# Patient Record
Sex: Male | Born: 1955 | Hispanic: Yes | Marital: Married | Smoking: Current every day smoker
Health system: Southern US, Community
[De-identification: ages and names within clinical notes are randomized; demographics above are authoritative.]

## PROBLEM LIST (undated history)

## (undated) DIAGNOSIS — I219 Acute myocardial infarction, unspecified: Secondary | ICD-10-CM

## (undated) DIAGNOSIS — B1920 Unspecified viral hepatitis C without hepatic coma: Secondary | ICD-10-CM

## (undated) DIAGNOSIS — F1910 Other psychoactive substance abuse, uncomplicated: Secondary | ICD-10-CM

## (undated) DIAGNOSIS — I10 Essential (primary) hypertension: Secondary | ICD-10-CM

## (undated) DIAGNOSIS — D509 Iron deficiency anemia, unspecified: Secondary | ICD-10-CM

## (undated) DIAGNOSIS — E785 Hyperlipidemia, unspecified: Secondary | ICD-10-CM

## (undated) HISTORY — DX: Acute myocardial infarction, unspecified: I21.9

## (undated) HISTORY — DX: Iron deficiency anemia, unspecified: D50.9

## (undated) HISTORY — PX: CARDIAC VALVE SURGERY: SHX40

## (undated) HISTORY — DX: Other psychoactive substance abuse, uncomplicated: F19.10

## (undated) HISTORY — DX: Essential (primary) hypertension: I10

## (undated) HISTORY — DX: Hyperlipidemia, unspecified: E78.5

## (undated) HISTORY — DX: Unspecified viral hepatitis C without hepatic coma: B19.20

---
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
## 2018-05-04 ENCOUNTER — Encounter: Payer: Self-pay | Admitting: Nurse Practitioner

## 2018-05-30 ENCOUNTER — Telehealth: Payer: Self-pay | Admitting: Family Medicine

## 2018-05-31 NOTE — Telephone Encounter (Signed)
Did you decide anything about this pt? I put the paperwork on your desk yesterday?

## 2018-05-31 NOTE — Telephone Encounter (Signed)
Should be okay to schedule. Not sure about paperwork. Thought I put it in my out box but not there now. WS

## 2018-05-31 NOTE — Telephone Encounter (Signed)
Dr Darlyn ReadStacks says he sent it up front and ok to schedule.

## 2018-06-01 NOTE — Telephone Encounter (Signed)
Called patient to schedule appointment.

## 2018-06-04 ENCOUNTER — Encounter: Payer: Self-pay | Admitting: Family Medicine

## 2018-06-04 ENCOUNTER — Ambulatory Visit: Payer: Managed Care, Other (non HMO) | Admitting: Family Medicine

## 2018-06-04 VITALS — BP 111/73 | HR 63 | Temp 97.7°F | Ht 70.0 in | Wt 185.1 lb

## 2018-06-04 DIAGNOSIS — E782 Mixed hyperlipidemia: Secondary | ICD-10-CM

## 2018-06-04 DIAGNOSIS — I2510 Atherosclerotic heart disease of native coronary artery without angina pectoris: Secondary | ICD-10-CM

## 2018-06-04 MED ORDER — ATORVASTATIN CALCIUM 80 MG PO TABS: 80.0000 mg | ORAL_TABLET | Freq: Every day | ORAL | 1 refills | Status: DC

## 2018-06-04 MED ORDER — LISINOPRIL 5 MG PO TABS: 5.0000 mg | ORAL_TABLET | Freq: Every day | ORAL | 1 refills | Status: DC

## 2018-06-04 MED ORDER — METOPROLOL TARTRATE 25 MG PO TABS: 25.0000 mg | ORAL_TABLET | Freq: Two times a day (BID) | ORAL | 1 refills | Status: DC

## 2018-06-04 MED ORDER — TICAGRELOR 90 MG PO TABS: 90.0000 mg | ORAL_TABLET | Freq: Two times a day (BID) | ORAL | 1 refills | Status: DC

## 2018-06-04 NOTE — Progress Notes (Signed)
Subjective:  Patient ID: Johnny Foster, male    DOB: 06-Mar-1956  Age: 61 y.o. MRN: [id number]  CC: New Patient (Initial Visit)   HPI Johnny Foster presents for Establish due to recent move from [place]. Recent MI> Three stents placed, Two in May, One in June. Echo showed nml LVEF. Denied chest pain, dysspnea and edema. 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 Worked there 28 years. Had pneumonia 22 years ago. Ablation of cervical nerve 4-5 on right a few years ago due to neck and shoulder pain. Pain resolved.  Depression screen PHQ 2/9 06/04/2018  Decreased Interest 0  Down, Depressed, Hopeless 0  PHQ - 2 Score 0    History Johnny Foster has a past medical history of Hepatitis C, Myocardial infarction (Johnny Foster), and Substance abuse (Johnny Foster).   He has a past surgical history that includes Cardiac surgery and Tonsilectomy, adenoidectomy, bilateral myringotomy and tubes (1969).   His family history includes Arthritis in his mother; Cancer in his brother.He reports that he has been smoking cigarettes.  He has a 1.25 pack-year smoking history. He has never used smokeless tobacco. He reports that he drank alcohol. He reports that he has current or past drug history. Drug: Other-see comments.    ROS Review of Systems  Constitutional: Negative.   HENT: Negative.   Eyes: Negative for visual disturbance.  Respiratory: Negative for cough and shortness of breath.   Cardiovascular: Negative for chest pain and leg swelling.  Gastrointestinal: Negative for abdominal pain, diarrhea, nausea and vomiting.  Genitourinary: Negative for difficulty urinating.  Musculoskeletal: Negative for arthralgias and myalgias.  Skin: Negative for rash.  Neurological: Negative for headaches.  Psychiatric/Behavioral: Negative for sleep disturbance.    Objective:  BP 111/73   Pulse 63   Temp 97.7 F (36.5 C) (Oral)   Ht 5' 10"  (1.778 m)   Wt 185 lb 2 oz (84 kg)   BMI 26.56 kg/m   BP  Readings from Last 3 Encounters:  06/04/18 111/73    Wt Readings from Last 3 Encounters:  06/04/18 185 lb 2 oz (84 kg)     Physical Exam  Constitutional: He is oriented to person, place, and time. He appears well-developed and well-nourished. No distress.  HENT:  Head: Normocephalic and atraumatic.  Right Ear: External ear normal.  Left Ear: External ear normal.  Nose: Nose normal.  Mouth/Throat: Oropharynx is clear and moist.  Eyes: Pupils are equal, round, and reactive to light. Conjunctivae and EOM are normal.  Neck: Normal range of motion. Neck supple.  Cardiovascular: Normal rate, regular rhythm and normal heart sounds.  No murmur heard. Pulmonary/Chest: Effort normal and breath sounds normal. No respiratory distress. He has no wheezes. He has no rales.  Abdominal: Soft. There is no tenderness.  Musculoskeletal: Normal range of motion.  Neurological: He is alert and oriented to person, place, and time. He has normal reflexes.  Skin: Skin is warm and dry.  Psychiatric: He has a normal mood and affect. His behavior is normal. Judgment and thought content normal.      Assessment & Plan:   Johnny Foster was seen today for new patient (initial visit).  Diagnoses and all orders for this visit:  ASCVD (arteriosclerotic cardiovascular disease) -     CBC with Differential/Platelet; Future -     CMP14+EGFR; Future -     Lipid panel; Future -     PSA, total and free; Future -     Ambulatory referral to Cardiology  Mixed hyperlipidemia  Other orders -     atorvastatin (LIPITOR) 80 MG tablet; Take 1 tablet (80 mg total) by mouth daily. -     lisinopril (PRINIVIL,ZESTRIL) 5 MG tablet; Take 1 tablet (5 mg total) by mouth daily. -     metoprolol tartrate (LOPRESSOR) 25 MG tablet; Take 1 tablet (25 mg total) by mouth 2 (two) times daily. -     ticagrelor (BRILINTA) 90 MG TABS tablet; Take 1 tablet (90 mg total) by mouth 2 (two) times daily.       I have changed Johnny Foster's  atorvastatin, lisinopril, metoprolol tartrate, and ticagrelor. I am also having him maintain his aspirin EC.  Allergies as of 06/04/2018   No Known Allergies     Medication List        Accurate as of 06/04/18 11:59 PM. Always use your most recent med list.          aspirin EC 81 MG tablet Take 81 mg by mouth daily.   atorvastatin 80 MG tablet Commonly known as:  LIPITOR Take 1 tablet (80 mg total) by mouth daily.   lisinopril 5 MG tablet Commonly known as:  PRINIVIL,ZESTRIL Take 1 tablet (5 mg total) by mouth daily.   metoprolol tartrate 25 MG tablet Commonly known as:  LOPRESSOR Take 1 tablet (25 mg total) by mouth 2 (two) times daily.   ticagrelor 90 MG Tabs tablet Commonly known as:  BRILINTA Take 1 tablet (90 mg total) by mouth 2 (two) times daily.        Follow-up: Return in about 3 months (around 09/04/2018).  Claretta Fraise, M.D.

## 2018-06-07 ENCOUNTER — Encounter: Payer: Self-pay | Admitting: Family Medicine

## 2018-06-08 ENCOUNTER — Other Ambulatory Visit: Payer: Managed Care, Other (non HMO)

## 2018-06-08 NOTE — Addendum Note (Signed)
Addended by: Margorie JohnJOHNSON, Emmarae Cowdery M on: 06/08/2018 08:07 AM   Modules accepted: Orders

## 2018-06-09 LAB — CBC WITH DIFFERENTIAL/PLATELET
Basophils Absolute: 0.1 10*3/uL (ref 0.0–0.2)
Basos: 1 %
EOS (ABSOLUTE): 0.4 10*3/uL (ref 0.0–0.4)
EOS: 5 %
HEMOGLOBIN: 13.8 g/dL (ref 13.0–17.7)
Hematocrit: 41.7 % (ref 37.5–51.0)
IMMATURE GRANS (ABS): 0 10*3/uL (ref 0.0–0.1)
Immature Granulocytes: 0 %
LYMPHS ABS: 2 10*3/uL (ref 0.7–3.1)
Lymphs: 21 %
MCH: 31 pg (ref 26.6–33.0)
MCHC: 33.1 g/dL (ref 31.5–35.7)
MCV: 94 fL (ref 79–97)
MONOCYTES: 7 %
Monocytes Absolute: 0.7 10*3/uL (ref 0.1–0.9)
Neutrophils Absolute: 6.3 10*3/uL (ref 1.4–7.0)
Neutrophils: 66 %
Platelets: 288 10*3/uL (ref 150–450)
RBC: 4.45 x10E6/uL (ref 4.14–5.80)
RDW: 13.6 % (ref 12.3–15.4)
WBC: 9.5 10*3/uL (ref 3.4–10.8)

## 2018-06-09 LAB — LIPID PANEL
Chol/HDL Ratio: 2.2 ratio (ref 0.0–5.0)
Cholesterol, Total: 117 mg/dL (ref 100–199)
HDL: 53 mg/dL (ref >39)
LDL Calculated: 50 mg/dL (ref 0–99)
Triglycerides: 72 mg/dL (ref 0–149)
VLDL Cholesterol Cal: 14 mg/dL (ref 5–40)

## 2018-06-09 LAB — CMP14+EGFR
ALBUMIN: 4.6 g/dL (ref 3.6–4.8)
ALK PHOS: 68 IU/L (ref 39–117)
ALT: 14 IU/L (ref 0–44)
AST: 13 IU/L (ref 0–40)
Albumin/Globulin Ratio: 2.1 (ref 1.2–2.2)
BUN / CREAT RATIO: 12 (ref 10–24)
BUN: 11 mg/dL (ref 8–27)
Bilirubin Total: 0.3 mg/dL (ref 0.0–1.2)
CO2: 19 mmol/L — AB (ref 20–29)
CREATININE: 0.95 mg/dL (ref 0.76–1.27)
Calcium: 9.4 mg/dL (ref 8.6–10.2)
Chloride: 109 mmol/L — ABNORMAL HIGH (ref 96–106)
GFR calc Af Amer: 100 mL/min/{1.73_m2} (ref >59)
GFR calc non Af Amer: 87 mL/min/{1.73_m2} (ref >59)
GLUCOSE: 96 mg/dL (ref 65–99)
Globulin, Total: 2.2 g/dL (ref 1.5–4.5)
Potassium: 4.7 mmol/L (ref 3.5–5.2)
Sodium: 142 mmol/L (ref 134–144)
TOTAL PROTEIN: 6.8 g/dL (ref 6.0–8.5)

## 2018-06-09 LAB — PSA, TOTAL AND FREE
PSA FREE PCT: 28 %
PSA, Free: 0.14 ng/mL
Prostate Specific Ag, Serum: 0.5 ng/mL (ref 0.0–4.0)

## 2018-07-10 ENCOUNTER — Telehealth: Payer: Self-pay | Admitting: *Deleted

## 2018-07-10 MED ORDER — ASPIRIN EC 81 MG PO TBEC: 81.0000 mg | DELAYED_RELEASE_TABLET | Freq: Every day | ORAL | 1 refills | Status: DC

## 2018-07-10 NOTE — Telephone Encounter (Signed)
Pt aware.

## 2018-07-12 ENCOUNTER — Ambulatory Visit: Payer: Managed Care, Other (non HMO) | Admitting: Cardiology

## 2018-08-23 ENCOUNTER — Ambulatory Visit: Payer: Managed Care, Other (non HMO) | Admitting: Family Medicine

## 2018-09-05 ENCOUNTER — Ambulatory Visit: Payer: Managed Care, Other (non HMO) | Admitting: Family Medicine

## 2018-09-05 ENCOUNTER — Encounter: Payer: Self-pay | Admitting: Family Medicine

## 2018-09-05 VITALS — BP 132/84 | HR 76 | Temp 98.0°F | Ht 70.0 in | Wt 185.0 lb

## 2018-09-05 DIAGNOSIS — Z23 Encounter for immunization: Secondary | ICD-10-CM

## 2018-09-05 DIAGNOSIS — I10 Essential (primary) hypertension: Secondary | ICD-10-CM | POA: Diagnosis not present

## 2018-09-05 DIAGNOSIS — I2510 Atherosclerotic heart disease of native coronary artery without angina pectoris: Secondary | ICD-10-CM

## 2018-09-05 DIAGNOSIS — E782 Mixed hyperlipidemia: Secondary | ICD-10-CM | POA: Diagnosis not present

## 2018-09-05 LAB — CBC WITH DIFFERENTIAL/PLATELET
BASOS ABS: 0.1 10*3/uL (ref 0.0–0.2)
Basos: 1 %
EOS (ABSOLUTE): 0.6 10*3/uL — AB (ref 0.0–0.4)
Eos: 7 %
Hematocrit: 37 % — ABNORMAL LOW (ref 37.5–51.0)
Hemoglobin: 11.8 g/dL — ABNORMAL LOW (ref 13.0–17.7)
Immature Grans (Abs): 0 10*3/uL (ref 0.0–0.1)
Immature Granulocytes: 0 %
LYMPHS ABS: 2.4 10*3/uL (ref 0.7–3.1)
Lymphs: 26 %
MCH: 28.6 pg (ref 26.6–33.0)
MCHC: 31.9 g/dL (ref 31.5–35.7)
MCV: 90 fL (ref 79–97)
MONOCYTES: 11 %
MONOS ABS: 1 10*3/uL — AB (ref 0.1–0.9)
Neutrophils Absolute: 5.1 10*3/uL (ref 1.4–7.0)
Neutrophils: 55 %
Platelets: 344 10*3/uL (ref 150–450)
RBC: 4.13 x10E6/uL — AB (ref 4.14–5.80)
RDW: 13.2 % (ref 12.3–15.4)
WBC: 9.3 10*3/uL (ref 3.4–10.8)

## 2018-09-05 MED ORDER — METOPROLOL TARTRATE 25 MG PO TABS: 25.0000 mg | ORAL_TABLET | Freq: Two times a day (BID) | ORAL | 1 refills | Status: DC

## 2018-09-05 MED ORDER — TICAGRELOR 90 MG PO TABS: 90.0000 mg | ORAL_TABLET | Freq: Two times a day (BID) | ORAL | 1 refills | Status: DC

## 2018-09-05 MED ORDER — LISINOPRIL 5 MG PO TABS: 5.0000 mg | ORAL_TABLET | Freq: Every day | ORAL | 1 refills | Status: DC

## 2018-09-05 MED ORDER — ATORVASTATIN CALCIUM 80 MG PO TABS: 80.0000 mg | ORAL_TABLET | Freq: Every day | ORAL | 1 refills | Status: DC

## 2018-09-07 ENCOUNTER — Other Ambulatory Visit: Payer: Self-pay | Admitting: Family Medicine

## 2018-09-09 ENCOUNTER — Encounter: Payer: Self-pay | Admitting: Family Medicine

## 2018-09-09 DIAGNOSIS — I2510 Atherosclerotic heart disease of native coronary artery without angina pectoris: Secondary | ICD-10-CM | POA: Insufficient documentation

## 2018-09-09 DIAGNOSIS — I10 Essential (primary) hypertension: Secondary | ICD-10-CM | POA: Insufficient documentation

## 2018-09-09 DIAGNOSIS — E782 Mixed hyperlipidemia: Secondary | ICD-10-CM | POA: Insufficient documentation

## 2018-09-09 NOTE — Progress Notes (Signed)
Subjective:  Patient ID: Johnny Foster,  male    DOB: 1956/03/11  Age: 62 y.o.    CC: Medical Management of Chronic Issues (reports he did not go to cardiology appointment because of insurance issues)   HPI Woodley Petzold presents for  follow-up of hypertension. Patient has no history of headache chest pain or shortness of breath or recent cough. Patient also denies symptoms of TIA such as numbness weakness lateralizing. Patient denies side effects from medication. States taking it regularly.  Patient also  in for follow-up of elevated cholesterol. Doing well without complaints on current medication. Denies side effects  including myalgia and arthralgia and nausea. Also in today for liver function testing. Currently no chest pain, shortness of breath or other cardiovascular related symptoms noted.  Patient was unable to make it to the cardiologist due to insurance problems.  Apparently he lost his insurance when he left his work without realizing it.  Now has some insurance but it has not fully kicked in.  Meanwhile he denies any chest pain or angina.  He is exercising.  He noticed that his breathing has changed and his strength and endurance and stamina have declined since his heart attacks.  History Zacory has a past medical history of Hepatitis C, Myocardial infarction (HCC), and Substance abuse (HCC).   He has a past surgical history that includes Cardiac surgery and Tonsilectomy, adenoidectomy, bilateral myringotomy and tubes (1969).   His family history includes Arthritis in his mother; Cancer in his brother.He reports that he has been smoking cigarettes. He has a 1.25 pack-year smoking history. He has never used smokeless tobacco. He reports that he drank alcohol. He reports that he has current or past drug history. Drug: Other-see comments.  Current Outpatient Medications on File Prior to Visit  Medication Sig Dispense Refill  . aspirin EC 81 MG tablet Take 1 tablet (81 mg  total) by mouth daily. 90 tablet 1   No current facility-administered medications on file prior to visit.     ROS Review of Systems  Constitutional: Positive for fatigue (Not overtly fatigued, but strength and stamina are decreased from his baseline although he remains active). Negative for appetite change.  HENT: Negative.   Eyes: Negative for visual disturbance.  Respiratory: Negative for cough and shortness of breath (Somewhat, on exertion).   Cardiovascular: Negative for chest pain and leg swelling.  Gastrointestinal: Negative for abdominal pain, diarrhea, nausea and vomiting.  Genitourinary: Negative for difficulty urinating.  Musculoskeletal: Negative for arthralgias and myalgias.  Skin: Negative for rash.  Neurological: Negative for headaches.  Psychiatric/Behavioral: Negative for sleep disturbance.    Objective:  BP 132/84   Pulse 76   Temp 98 F (36.7 C) (Oral)   Ht 5\' 10"  (1.778 m)   Wt 185 lb (83.9 kg)   BMI 26.54 kg/m   BP Readings from Last 3 Encounters:  09/05/18 132/84  06/04/18 111/73    Wt Readings from Last 3 Encounters:  09/05/18 185 lb (83.9 kg)  06/04/18 185 lb 2 oz (84 kg)     Physical Exam  Constitutional: He is oriented to person, place, and time. He appears well-developed and well-nourished. No distress.  HENT:  Head: Normocephalic and atraumatic.  Right Ear: External ear normal.  Left Ear: External ear normal.  Nose: Nose normal.  Mouth/Throat: Oropharynx is clear and moist.  Eyes: Pupils are equal, round, and reactive to light. Conjunctivae and EOM are normal.  Neck: Normal range of motion. Neck supple.  Cardiovascular:  Normal rate, regular rhythm and normal heart sounds.  No murmur heard. Pulmonary/Chest: Effort normal and breath sounds normal. No respiratory distress. He has no wheezes. He has no rales.  Abdominal: Soft. There is no tenderness.  Musculoskeletal: Normal range of motion.  Neurological: He is alert and oriented to  person, place, and time. He has normal reflexes.  Skin: Skin is warm and dry.  Psychiatric: He has a normal mood and affect. His behavior is normal. Judgment and thought content normal.    Assessment & Plan:   Phi was seen today for medical management of chronic issues.  Diagnoses and all orders for this visit:  ASCVD (arteriosclerotic cardiovascular disease) -     CBC with Differential/Platelet -     Ambulatory referral to Cardiology  Mixed hyperlipidemia -     CBC with Differential/Platelet  Need for immunization against influenza -     Flu Vaccine QUAD 36+ mos IM  Essential hypertension  Other orders -     ticagrelor (BRILINTA) 90 MG TABS tablet; Take 1 tablet (90 mg total) by mouth 2 (two) times daily. -     metoprolol tartrate (LOPRESSOR) 25 MG tablet; Take 1 tablet (25 mg total) by mouth 2 (two) times daily. -     lisinopril (PRINIVIL,ZESTRIL) 5 MG tablet; Take 1 tablet (5 mg total) by mouth daily. -     atorvastatin (LIPITOR) 80 MG tablet; Take 1 tablet (80 mg total) by mouth daily.   I am having Viet Kemmerer maintain his aspirin EC, ticagrelor, metoprolol tartrate, lisinopril, and atorvastatin.  Meds ordered this encounter  Medications  . ticagrelor (BRILINTA) 90 MG TABS tablet    Sig: Take 1 tablet (90 mg total) by mouth 2 (two) times daily.    Dispense:  180 tablet    Refill:  1  . metoprolol tartrate (LOPRESSOR) 25 MG tablet    Sig: Take 1 tablet (25 mg total) by mouth 2 (two) times daily.    Dispense:  180 tablet    Refill:  1  . lisinopril (PRINIVIL,ZESTRIL) 5 MG tablet    Sig: Take 1 tablet (5 mg total) by mouth daily.    Dispense:  90 tablet    Refill:  1  . atorvastatin (LIPITOR) 80 MG tablet    Sig: Take 1 tablet (80 mg total) by mouth daily.    Dispense:  90 tablet    Refill:  1   I encouraged him to go ahead with cardiology follow-up as soon as possible.  Follow-up: Return in about 6 months (around 03/07/2019), or if symptoms worsen or fail  to improve.  Mechele Claude, M.D.

## 2018-09-11 ENCOUNTER — Other Ambulatory Visit: Payer: Managed Care, Other (non HMO)

## 2018-09-11 DIAGNOSIS — I2510 Atherosclerotic heart disease of native coronary artery without angina pectoris: Secondary | ICD-10-CM

## 2018-09-11 NOTE — Addendum Note (Signed)
Addended by: Margorie John on: 09/11/2018 05:45 PM   Modules accepted: Orders

## 2018-09-12 LAB — FECAL OCCULT BLOOD, IMMUNOCHEMICAL: FECAL OCCULT BLD: NEGATIVE

## 2018-09-17 ENCOUNTER — Telehealth: Payer: Self-pay | Admitting: *Deleted

## 2018-09-17 NOTE — Telephone Encounter (Signed)
Pt aware that FOBT was negative. He is very concerned as to why his Hgb is getting lower. He would like a call tomorrow on next step

## 2018-09-18 ENCOUNTER — Encounter: Payer: Self-pay | Admitting: Cardiology

## 2018-09-18 NOTE — Progress Notes (Signed)
Cardiology Office Note   Date:  09/19/2018   ID:  Johnny Foster, DOB 27-Jul-1956, MRN [id number]  PCP:  Mechele Claude, MD  Cardiologist:   No primary care provider on file. Referring:  Mechele Claude, MD  Chief Complaint  Patient presents with  . Coronary Artery Disease      History of Present Illness: Johnny Foster is a 62 y.o. male who presents for follow up of CAD    He had a cardiac cath in June in [place] after presenting with a acute inferolateral MI.  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  EF was normal.    The anatomy is detailed below.  He presents for follow as a new patient.   Since his event he has done well.  He denies any cardiovascular symptoms.  He is done a little bit of walking.  The patient denies any new symptoms such as chest discomfort, neck or arm discomfort. There has been no new shortness of breath, PND or orthopnea. There have been no reported palpitations, presyncope or syncope.    Past Medical History:  Diagnosis Date  . Hepatitis C    Treated  . Myocardial infarction [facility])    May 2019.  LAD moderate 60 to 80% stenosis.  Circumflex OM occluded.  RCA 80% stenosis.  OM was thought to be a acute infarct vessel.  It was treated with a Synergy stent.  This is a 2.5 x 20 mm, RCA was treated with a 3.  3.0 by 16 mm Synergy.  Elective PCI in June of an LAD lesion with 3.0 x 38 mm Synergy.  . 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 TONSILECTOMY, ADENOIDECTOMY, BILATERAL MYRINGOTOMY AND TUBES  1969     Current Outpatient Medications  Medication Sig Dispense Refill  . aspirin EC 81 MG tablet Take 1 tablet (81 mg total) by mouth daily. 90 tablet 1  . atorvastatin (LIPITOR) 80 MG tablet Take 1 tablet (80 mg total) by mouth daily. 90 tablet 1  . lisinopril (PRINIVIL,ZESTRIL) 5 MG tablet Take 1  tablet (5 mg total) by mouth daily. 90 tablet 1  . metoprolol tartrate (LOPRESSOR) 25 MG tablet Take 1 tablet (25 mg total) by mouth 2 (two) times daily. 180 tablet 1  . ticagrelor (BRILINTA) 90 MG TABS tablet Take 1 tablet (90 mg total) by mouth 2 (two) times daily. 180 tablet 1  . nitroGLYCERIN (NITROSTAT) 0.4 MG SL tablet Place 1 tablet (0.4 mg total) under the tongue every 5 (five) minutes as needed for chest pain. 25 tablet prn   No current facility-administered medications for this visit.     Allergies:   Patient has no known allergies.    Social History:  The patient  reports that he has been smoking cigarettes. He has a 1.25 pack-year smoking history. He has never used smokeless tobacco. He reports that he drank alcohol. He reports that he has current or past drug history. Drug: Other-see comments.   Family History:  The patient's family history includes Arthritis in his mother; CAD (age of onset: 110) in his mother; Cancer in his brother; Lung disease in his father.    ROS:  Please see the history of present illness.   Otherwise, review of systems are positive for none.   All other  systems are reviewed and negative.    PHYSICAL EXAM: VS:  BP 100/70   Pulse 60   Ht 5\' 10"  (1.778 m)   Wt 182 lb (82.6 kg)   BMI 26.11 kg/m  , BMI Body mass index is 26.11 kg/m. GENERAL:  Well appearing HEENT:  Pupils equal round and reactive, fundi not visualized, oral mucosa unremarkable NECK:  No jugular venous distention, waveform within normal limits, carotid upstroke brisk and symmetric, no bruits, no thyromegaly LYMPHATICS:  No cervical, inguinal adenopathy LUNGS:  Clear to auscultation bilaterally BACK:  No CVA tenderness CHEST:  Unremarkable HEART:  PMI not displaced or sustained,S1 and S2 within normal limits, no S3, no S4, no clicks, no rubs, no murmurs ABD:  Flat, positive bowel sounds normal in frequency in pitch, no bruits, no rebound, no guarding, no midline pulsatile mass, no  hepatomegaly, no splenomegaly EXT:  2 plus pulses throughout, no edema, no cyanosis no clubbing SKIN:  No rashes no nodules NEURO:  Cranial nerves II through XII grossly intact, motor grossly intact throughout PSYCH:  Cognitively intact, oriented to person place and time    EKG:  EKG is ordered today. The ekg ordered  demonstrates sinus rhythm, rate 55, axis within normal limits,   Recent Labs: 06/08/2018: ALT 14; BUN 11; Creatinine, Ser 0.95; Potassium 4.7; Sodium 142 09/05/2018: Hemoglobin 11.8; Platelets 344    Lipid Panel    Component Value Date/Time   CHOL 117 06/08/2018 0807   TRIG 72 06/08/2018 0807   HDL 53 06/08/2018 0807   CHOLHDL 2.2 06/08/2018 0807   LDLCALC 50 06/08/2018 0807      Wt Readings from Last 3 Encounters:  09/19/18 182 lb (82.6 kg)  09/05/18 185 lb (83.9 kg)  06/04/18 185 lb 2 oz (84 kg)      Other studies Reviewed: Additional studies/ records that were reviewed today include:  Records from the outside hospital.  . Review of the above records demonstrates:  Please see elsewhere in the note.     ASSESSMENT AND PLAN:  CAD: We had a long discussion about this.  He has no ongoing symptoms.  He needs aggressive secondary risk reduction.  He is going to participate in cardiac rehab.  We had an extensive discussion about lifestyle modification.  TOBACCO ABUSE : He started smoking again and he commits to a quit date.  DYSLIPIDEMIA: Lipids are excellent and he will continue the meds as listed.  ANEMIA: He has been noted recently to have anemia with guaiac negative stools.  He will get a CBC.   Current medicines are reviewed at length with the patient today.  The patient does not have concerns regarding medicines.  The following changes have been made:  no change  Labs/ tests ordered today include:   Orders Placed This Encounter  Procedures  . CBC  . AMB referral to cardiac rehabilitation  . EKG 12-Lead     Disposition:   FU with me in 3  months    Signed, Rollene Rotunda, MD  09/19/2018 12:31 PM    [facility]

## 2018-09-19 ENCOUNTER — Encounter: Payer: Self-pay | Admitting: Cardiology

## 2018-09-19 ENCOUNTER — Ambulatory Visit: Payer: Managed Care, Other (non HMO) | Admitting: Cardiology

## 2018-09-19 VITALS — BP 100/70 | HR 60 | Ht 70.0 in | Wt 182.0 lb

## 2018-09-19 DIAGNOSIS — Z720 Tobacco use: Secondary | ICD-10-CM | POA: Diagnosis not present

## 2018-09-19 DIAGNOSIS — I10 Essential (primary) hypertension: Secondary | ICD-10-CM

## 2018-09-19 DIAGNOSIS — I2510 Atherosclerotic heart disease of native coronary artery without angina pectoris: Secondary | ICD-10-CM

## 2018-09-19 DIAGNOSIS — Z79899 Other long term (current) drug therapy: Secondary | ICD-10-CM

## 2018-09-19 MED ORDER — NITROGLYCERIN 0.4 MG SL SUBL: 0.4000 mg | SUBLINGUAL_TABLET | SUBLINGUAL | 99 refills | Status: DC | PRN

## 2018-09-19 NOTE — Patient Instructions (Signed)
Medication Instructions:  The current medical regimen is effective;  continue present plan and medications.  If you need a refill on your cardiac medications before your next appointment, please call your pharmacy.   Lab work: Please have blood work (CBC) in one month at [facility].  Follow-Up: Follow up in 3 months with Dr Antoine Poche in [place].  You have been referred to Cardiac Rehabilitation at [facility].  They will contact you to schedule.  Thank you for choosing [facility]!!

## 2018-09-20 NOTE — Telephone Encounter (Signed)
Left message to call back  

## 2018-09-20 NOTE — Telephone Encounter (Signed)
This is not likely to be a problem. I suggest just monitoring it every month for a while. Isuspect this to be a reaction to his heart problem from earlier in the year that should resolve with time. WS

## 2018-09-27 ENCOUNTER — Other Ambulatory Visit: Payer: Managed Care, Other (non HMO)

## 2018-09-27 DIAGNOSIS — Z79899 Other long term (current) drug therapy: Secondary | ICD-10-CM

## 2018-09-27 DIAGNOSIS — I2510 Atherosclerotic heart disease of native coronary artery without angina pectoris: Secondary | ICD-10-CM

## 2018-09-27 DIAGNOSIS — I10 Essential (primary) hypertension: Secondary | ICD-10-CM

## 2018-09-28 LAB — CBC
HEMATOCRIT: 36 % — AB (ref 37.5–51.0)
HEMOGLOBIN: 11.1 g/dL — AB (ref 13.0–17.7)
MCH: 27.1 pg (ref 26.6–33.0)
MCHC: 30.8 g/dL — AB (ref 31.5–35.7)
MCV: 88 fL (ref 79–97)
Platelets: 355 10*3/uL (ref 150–450)
RBC: 4.1 x10E6/uL — ABNORMAL LOW (ref 4.14–5.80)
RDW: 13.2 % (ref 12.3–15.4)
WBC: 10.2 10*3/uL (ref 3.4–10.8)

## 2018-10-01 ENCOUNTER — Other Ambulatory Visit: Payer: Self-pay | Admitting: Family Medicine

## 2018-10-01 DIAGNOSIS — D649 Anemia, unspecified: Secondary | ICD-10-CM

## 2018-10-02 ENCOUNTER — Telehealth (HOSPITAL_COMMUNITY): Payer: Self-pay | Admitting: Surgery

## 2018-10-02 ENCOUNTER — Inpatient Hospital Stay (HOSPITAL_COMMUNITY): Payer: 59 | Attending: Internal Medicine | Admitting: Internal Medicine

## 2018-10-02 ENCOUNTER — Inpatient Hospital Stay (HOSPITAL_COMMUNITY): Payer: 59

## 2018-10-02 ENCOUNTER — Other Ambulatory Visit: Payer: Self-pay

## 2018-10-02 ENCOUNTER — Encounter (HOSPITAL_COMMUNITY): Payer: Self-pay | Admitting: Internal Medicine

## 2018-10-02 VITALS — BP 108/60 | HR 62 | Temp 97.9°F | Resp 18 | Wt 184.2 lb

## 2018-10-02 DIAGNOSIS — B1920 Unspecified viral hepatitis C without hepatic coma: Secondary | ICD-10-CM | POA: Diagnosis not present

## 2018-10-02 DIAGNOSIS — D509 Iron deficiency anemia, unspecified: Secondary | ICD-10-CM | POA: Insufficient documentation

## 2018-10-02 DIAGNOSIS — Z7982 Long term (current) use of aspirin: Secondary | ICD-10-CM | POA: Insufficient documentation

## 2018-10-02 DIAGNOSIS — D6489 Other specified anemias: Secondary | ICD-10-CM | POA: Diagnosis not present

## 2018-10-02 DIAGNOSIS — I10 Essential (primary) hypertension: Secondary | ICD-10-CM | POA: Diagnosis not present

## 2018-10-02 DIAGNOSIS — I2510 Atherosclerotic heart disease of native coronary artery without angina pectoris: Secondary | ICD-10-CM | POA: Diagnosis not present

## 2018-10-02 DIAGNOSIS — Z720 Tobacco use: Secondary | ICD-10-CM

## 2018-10-02 DIAGNOSIS — D508 Other iron deficiency anemias: Secondary | ICD-10-CM

## 2018-10-02 HISTORY — DX: Iron deficiency anemia, unspecified: D50.9

## 2018-10-02 LAB — COMPREHENSIVE METABOLIC PANEL
ALK PHOS: 57 U/L (ref 38–126)
ALT: 20 U/L (ref 0–44)
AST: 22 U/L (ref 15–41)
Albumin: 4.3 g/dL (ref 3.5–5.0)
Anion gap: 4 — ABNORMAL LOW (ref 5–15)
BUN: 12 mg/dL (ref 6–20)
CALCIUM: 9 mg/dL (ref 8.9–10.3)
CHLORIDE: 112 mmol/L — AB (ref 98–111)
CO2: 22 mmol/L (ref 22–32)
Creatinine, Ser: 0.8 mg/dL (ref 0.61–1.24)
Glucose, Bld: 98 mg/dL (ref 70–99)
Potassium: 4.5 mmol/L (ref 3.5–5.1)
Sodium: 138 mmol/L (ref 135–145)
Total Bilirubin: 0.8 mg/dL (ref 0.3–1.2)
Total Protein: 7.5 g/dL (ref 6.5–8.1)

## 2018-10-02 LAB — CBC WITH DIFFERENTIAL/PLATELET
Abs Immature Granulocytes: 0.01 10*3/uL (ref 0.00–0.07)
BASOS ABS: 0.1 10*3/uL (ref 0.0–0.1)
Basophils Relative: 1 %
EOS ABS: 0.5 10*3/uL (ref 0.0–0.5)
Eosinophils Relative: 6 %
HEMATOCRIT: 35.3 % — AB (ref 39.0–52.0)
HEMOGLOBIN: 11.1 g/dL — AB (ref 13.0–17.0)
Immature Granulocytes: 0 %
LYMPHS ABS: 2.1 10*3/uL (ref 0.7–4.0)
LYMPHS PCT: 23 %
MCH: 27.7 pg (ref 26.0–34.0)
MCHC: 31.4 g/dL (ref 30.0–36.0)
MCV: 88 fL (ref 80.0–100.0)
Monocytes Absolute: 1 10*3/uL (ref 0.1–1.0)
Monocytes Relative: 11 %
NEUTROS PCT: 59 %
NRBC: 0 % (ref 0.0–0.2)
Neutro Abs: 5.4 10*3/uL (ref 1.7–7.7)
PLATELETS: 358 10*3/uL (ref 150–400)
RBC: 4.01 MIL/uL — AB (ref 4.22–5.81)
RDW: 14.1 % (ref 11.5–15.5)
WBC: 9 10*3/uL (ref 4.0–10.5)

## 2018-10-02 LAB — FOLATE: FOLATE: 12 ng/mL (ref >5.9)

## 2018-10-02 LAB — LACTATE DEHYDROGENASE: LDH: 118 U/L (ref 98–192)

## 2018-10-02 LAB — FERRITIN: Ferritin: 6 ng/mL — ABNORMAL LOW (ref 24–336)

## 2018-10-02 LAB — VITAMIN B12: VITAMIN B 12: 213 pg/mL (ref 180–914)

## 2018-10-02 NOTE — Telephone Encounter (Signed)
Pt's iron level was low, and Dr Melton AlarHiggs instructed me to call pt and go over the 2 options to increase his iron level.  Pt chose to do the IV iron (2 doses 1 week apart).  Will notify Dr Melton AlarHiggs of pt's decision.

## 2018-10-02 NOTE — Patient Instructions (Signed)
[facility]  Discharge Instructions: You saw Dr. Higgs today                               _______________________________________________________________  Thank you for choosing [facility] at [facility] to provide your oncology and hematology care.  To afford each patient quality time with our providers, please arrive at least 15 minutes before your scheduled appointment.  You need to re-schedule your appointment if you arrive 10 or more minutes late.  We strive to give you quality time with our providers, and arriving late affects you and other patients whose appointments are after yours.  Also, if you no show three or more times for appointments you may be dismissed from the clinic.  Again, thank you for choosing [facility]. Our hope is that these requests will allow you access to exceptional care and in a timely manner. _______________________________________________________________  If you have questions after your visit, please contact our office at [phone] between the hours of 8:30 a.m. and 5:00 p.m. Voicemails left after 4:30 p.m. will not be returned until the following business day. _______________________________________________________________  For prescription refill requests, have your pharmacy contact our office. _______________________________________________________________  Recommendations made by the consultant and any test results will be sent to your referring physician. _______________________________________________________________ 

## 2018-10-02 NOTE — Progress Notes (Signed)
Referring Physician:  [facility]  Diagnosis Anemia due to other cause, not classified - Plan: CBC with Differential/Platelet, Comprehensive metabolic panel, Lactate dehydrogenase, Protein electrophoresis, serum, Ferritin, Vitamin B12, Folate, Hemoglobinopathy evaluation, Haptoglobin, Methylmalonic acid, serum, Ambulatory Referral for Lung Cancer Screening  Tobacco abuse - Plan: CBC with Differential/Platelet, Comprehensive metabolic panel, Lactate dehydrogenase, Protein electrophoresis, serum, Ferritin, Vitamin B12, Folate, Hemoglobinopathy evaluation, Haptoglobin, Methylmalonic acid, serum, Ambulatory Referral for Lung Cancer Screening  Staging Cancer Staging No matching staging information was found for the patient.  Assessment and Plan:  1.  Iron deficiency anemia.  Labs done 10/02/2018 reviewed and showed WBC 9 HB 11.1 and plts 358,000.  Ferritin is decreased at 6 which is consistent with IDA.  Awaiting SPEP, B12, folate, MMA and HB electrophoresis.  Pt is referred to GI for evaluation.  He had recent FOBT done on 09/11/2018 that was negative.  He has an intolerance to oral iron.  He is recommended for Feraheme 510 mg IV weekly times 2.   Side effects of medication reviewed.   Pt will RTC in 10/2018 for follow-up and repeat labs after IV iron.    2.  Smoking.  Pt has greater than 30 year history of smoking.  He is referred to lung cancer screening program.    3.  CAD. Follow-up with PCP and/or cardiology as recommended.    4.  HTN. BP is 108/60.  Follow-up with PCP.    5  Health maintenance.  Pt is referred to GI for evaluation due to IDA.    Greater than 40 minutes spent with more than 50% spent in counseling and coordination of care.    HPI:  62 year old male referred for evaluation of anemia.  He denies any blood in stool or urine.  He had labs done 09/27/2018 that showed WBC 10 HB 11.1 plts 355,000.  FOBT was negative.  He reports last GI evaluation was in [place] in 2017.  Pt has long smoking history > 30 years.  He is seen today for consultation due to anemia.    Problem List Patient Active Problem List   Diagnosis Date Noted  . Encounter for long-term (current) use of medications [Z79.899] 09/19/2018  . Tobacco abuse [Z72.0] 09/19/2018  . ASCVD (arteriosclerotic cardiovascular disease) [I25.10] 09/09/2018  . Essential hypertension [I10] 09/09/2018  . Mixed hyperlipidemia [E78.2] 09/09/2018    Past Medical History Past Medical History:  Diagnosis Date  . Hepatitis C    Treated  . Myocardial infarction [facility])    May 2019.  LAD moderate 60 to 80% stenosis.  Circumflex OM occluded.  RCA 80% stenosis.  OM was thought to be a acute infarct vessel.  It was treated with a Synergy stent.  This is a 2.5 x 20 mm, RCA was treated with a 3.  3.0 by 16 mm Synergy.  Elective PCI in June of an LAD lesion with 3.0 x 38 mm Synergy.  . 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 TONSILECTOMY, ADENOIDECTOMY, BILATERAL MYRINGOTOMY AND TUBES  1969    Family History Family History  Problem Relation Age of Onset  . Arthritis Mother   . CAD Mother 45  . Lung disease Father   . Cancer Brother      Social History  reports that he has been smoking cigarettes. He has a 1.25 pack-year smoking history. He has  never used smokeless tobacco. He reports that he drinks alcohol. He reports that he has current or past drug history. Drug: Other-see comments.  Medications  Current Outpatient Medications:  .  aspirin EC 81 MG tablet, Take 1 tablet (81 mg total) by mouth daily., Disp: 90 tablet, Rfl: 1 .  atorvastatin (LIPITOR) 80 MG tablet, Take 1 tablet (80 mg total) by mouth daily., Disp: 90 tablet, Rfl: 1 .  lisinopril (PRINIVIL,ZESTRIL) 5 MG tablet, Take 1 tablet (5 mg total) by mouth daily., Disp: 90 tablet, Rfl: 1 .  metoprolol tartrate (LOPRESSOR) 25 MG  tablet, Take 1 tablet (25 mg total) by mouth 2 (two) times daily., Disp: 180 tablet, Rfl: 1 .  nitroGLYCERIN (NITROSTAT) 0.4 MG SL tablet, Place 1 tablet (0.4 mg total) under the tongue every 5 (five) minutes as needed for chest pain., Disp: 25 tablet, Rfl: prn .  ticagrelor (BRILINTA) 90 MG TABS tablet, Take 1 tablet (90 mg total) by mouth 2 (two) times daily., Disp: 180 tablet, Rfl: 1  Allergies Patient has no known allergies.  Review of Systems Review of Systems - Oncology ROS negative other than fatigue   Physical Exam  Vitals Wt Readings from Last 3 Encounters:  10/02/18 184 lb 3.2 oz (83.6 kg)  09/19/18 182 lb (82.6 kg)  09/05/18 185 lb (83.9 kg)   Temp Readings from Last 3 Encounters:  10/02/18 97.9 F (36.6 C) (Oral)  09/05/18 98 F (36.7 C) (Oral)  06/04/18 97.7 F (36.5 C) (Oral)   BP Readings from Last 3 Encounters:  10/02/18 108/60  09/19/18 100/70  09/05/18 132/84   Pulse Readings from Last 3 Encounters:  10/02/18 62  09/19/18 60  09/05/18 76   Constitutional: Well-developed, well-nourished, and in no distress.   HENT: Head: Normocephalic and atraumatic.  Mouth/Throat: No oropharyngeal exudate. Mucosa moist. Eyes: Pupils are equal, round, and reactive to light. Conjunctivae are normal. No scleral icterus.  Neck: Normal range of motion. Neck supple. No JVD present.  Cardiovascular: Normal rate, regular rhythm and normal heart sounds.  Exam reveals no gallop and no friction rub.   No murmur heard. Pulmonary/Chest: Effort normal and breath sounds normal. No respiratory distress. No wheezes.No rales.  Abdominal: Soft. Bowel sounds are normal. No distension. There is no tenderness. There is no guarding.  Musculoskeletal: No edema or tenderness.  Lymphadenopathy: No cervical, axillary or supraclavicular adenopathy.  Neurological: Alert and oriented to person, place, and time. No cranial nerve deficit.  Skin: Skin is warm and dry. No rash noted. No erythema. No  pallor.  Psychiatric: Affect and judgment normal.   Labs Appointment on 10/02/2018  Component Date Value Ref Range Status  . WBC 10/02/2018 9.0  4.0 - 10.5 K/uL Final  . RBC 10/02/2018 4.01* 4.22 - 5.81 MIL/uL Final  . Hemoglobin 10/02/2018 11.1* 13.0 - 17.0 g/dL Final  . HCT 10/02/2018 35.3* 39.0 - 52.0 % Final  . MCV 10/02/2018 88.0  80.0 - 100.0 fL Final  . MCH 10/02/2018 27.7  26.0 - 34.0 pg Final  . MCHC 10/02/2018 31.4  30.0 - 36.0 g/dL Final  . RDW 10/02/2018 14.1  11.5 - 15.5 % Final  . Platelets 10/02/2018 358  150 - 400 K/uL Final  . nRBC 10/02/2018 0.0  0.0 - 0.2 % Final  . Neutrophils Relative % 10/02/2018 59  % Final  . Neutro Abs 10/02/2018 5.4  1.7 - 7.7 K/uL Final  . Lymphocytes Relative 10/02/2018 23  % Final  . Lymphs Abs 10/02/2018 2.1  0.7 - 4.0 K/uL Final  . Monocytes Relative 10/02/2018 11  % Final  . Monocytes Absolute 10/02/2018 1.0  0.1 - 1.0 K/uL Final  . Eosinophils Relative 10/02/2018 6  % Final  . Eosinophils Absolute 10/02/2018 0.5  0.0 - 0.5 K/uL Final  . Basophils Relative 10/02/2018 1  % Final  . Basophils Absolute 10/02/2018 0.1  0.0 - 0.1 K/uL Final  . Immature Granulocytes 10/02/2018 0  % Final  . Abs Immature Granulocytes 10/02/2018 0.01  0.00 - 0.07 K/uL Final   Performed at [facility], [address], [place], [place] [address]  . Sodium 10/02/2018 138  135 - 145 mmol/L Final  . Potassium 10/02/2018 4.5  3.5 - 5.1 mmol/L Final  . Chloride 10/02/2018 112* 98 - 111 mmol/L Final  . CO2 10/02/2018 22  22 - 32 mmol/L Final  . Glucose, Bld 10/02/2018 98  70 - 99 mg/dL Final  . BUN 10/02/2018 12  6 - 20 mg/dL Final  . Creatinine, Ser 10/02/2018 0.80  0.61 - 1.24 mg/dL Final  . Calcium 10/02/2018 9.0  8.9 - 10.3 mg/dL Final  . Total Protein 10/02/2018 7.5  6.5 - 8.1 g/dL Final  . Albumin 10/02/2018 4.3  3.5 - 5.0 g/dL Final  . AST 10/02/2018 22  15 - 41 U/L Final  . ALT 10/02/2018 20  0 - 44 U/L Final  . Alkaline Phosphatase 10/02/2018 57  38 -  126 U/L Final  . Total Bilirubin 10/02/2018 0.8  0.3 - 1.2 mg/dL Final  . GFR calc non Af Amer 10/02/2018 >60  >60 mL/min Final  . GFR calc Af Amer 10/02/2018 >60  >60 mL/min Final   Comment: (NOTE) The eGFR has been calculated using the CKD EPI equation. This calculation has not been validated in all clinical situations. eGFR's persistently <60 mL/min signify possible Chronic Kidney Disease.   Georgiann Hahn gap 10/02/2018 4* 5 - 15 Final   Performed at [facility], [address] [place], [place] [address]  . LDH 10/02/2018 118  98 - 192 U/L Final   Performed at [facility], [address], [place], [place] [address]  . Ferritin 10/02/2018 6* 24 - 336 ng/mL Final   Performed at [facility], [address], [place], [place] [address]  . Vitamin B-12 10/02/2018 213  180 - 914 pg/mL Final   Comment: (NOTE) This assay is not validated for testing neonatal or myeloproliferative syndrome specimens for Vitamin B12 levels. Performed at [facility], [address] [place], [place] [address]   . Folate 10/02/2018 12.0  >5.9 ng/mL Final   Performed at [facility], [address], [place], [place] [address]     Pathology Orders Placed This Encounter  Procedures  . CBC with Differential/Platelet    Standing Status:   Future    Number of Occurrences:   1    Standing Expiration Date:   10/03/2019  . Comprehensive metabolic panel    Standing Status:   Future    Number of Occurrences:   1    Standing Expiration Date:   10/03/2019  . Lactate dehydrogenase    Standing Status:   Future    Number of Occurrences:   1    Standing Expiration Date:   10/03/2019  . Protein electrophoresis, serum    Standing Status:   Future    Number of Occurrences:   1    Standing Expiration Date:   10/03/2019  . Ferritin    Standing Status:  Future    Number of Occurrences:   1    Standing Expiration Date:   10/03/2019  . Vitamin B12    Standing Status:   Future    Number of Occurrences:   1    Standing  Expiration Date:   10/03/2019  . Folate    Standing Status:   Future    Number of Occurrences:   1    Standing Expiration Date:   10/03/2019  . Hemoglobinopathy evaluation    Standing Status:   Future    Number of Occurrences:   1    Standing Expiration Date:   10/03/2019  . Haptoglobin    Standing Status:   Future    Number of Occurrences:   1    Standing Expiration Date:   10/03/2019  . Methylmalonic acid, serum    Standing Status:   Future    Number of Occurrences:   1    Standing Expiration Date:   10/03/2019  . Ambulatory Referral for Lung Cancer Screening    Referral Priority:   Routine    Referral Type:   Consultation    Referral Reason:   Specialty Services Required    Number of Visits Requested:   [place] MD

## 2018-10-03 LAB — PROTEIN ELECTROPHORESIS, SERUM
A/G RATIO SPE: 1.4 (ref 0.7–1.7)
ALPHA-2-GLOBULIN: 0.8 g/dL (ref 0.4–1.0)
Albumin ELP: 4 g/dL (ref 2.9–4.4)
Alpha-1-Globulin: 0.2 g/dL (ref 0.0–0.4)
Beta Globulin: 1 g/dL (ref 0.7–1.3)
GLOBULIN, TOTAL: 2.8 g/dL (ref 2.2–3.9)
Gamma Globulin: 0.7 g/dL (ref 0.4–1.8)
Total Protein ELP: 6.8 g/dL (ref 6.0–8.5)

## 2018-10-03 LAB — HAPTOGLOBIN: HAPTOGLOBIN: 146 mg/dL (ref 34–200)

## 2018-10-04 ENCOUNTER — Other Ambulatory Visit: Payer: Self-pay | Admitting: Acute Care

## 2018-10-04 DIAGNOSIS — Z122 Encounter for screening for malignant neoplasm of respiratory organs: Secondary | ICD-10-CM

## 2018-10-04 DIAGNOSIS — F17210 Nicotine dependence, cigarettes, uncomplicated: Principal | ICD-10-CM

## 2018-10-04 LAB — HEMOGLOBINOPATHY EVALUATION
Hgb A2 Quant: 2 % (ref 1.8–3.2)
Hgb A: 98 % (ref 96.4–98.8)
Hgb C: 0 %
Hgb F Quant: 0 % (ref 0.0–2.0)
Hgb S Quant: 0 %
Hgb Variant: 0 %

## 2018-10-04 LAB — METHYLMALONIC ACID, SERUM: Methylmalonic Acid, Quantitative: 99 nmol/L (ref 0–378)

## 2018-10-05 ENCOUNTER — Inpatient Hospital Stay (HOSPITAL_COMMUNITY): Payer: 59

## 2018-10-05 ENCOUNTER — Encounter (HOSPITAL_COMMUNITY): Payer: Self-pay

## 2018-10-05 VITALS — BP 101/50 | HR 58 | Temp 97.9°F | Resp 18 | Wt 184.2 lb

## 2018-10-05 DIAGNOSIS — D508 Other iron deficiency anemias: Secondary | ICD-10-CM

## 2018-10-05 DIAGNOSIS — D6489 Other specified anemias: Secondary | ICD-10-CM | POA: Diagnosis not present

## 2018-10-05 MED ORDER — SODIUM CHLORIDE 0.9 % IV SOLN
Freq: Once | INTRAVENOUS | Status: AC
  Administered 2018-10-05: 14:00:00 via INTRAVENOUS

## 2018-10-05 MED ORDER — SODIUM CHLORIDE 0.9 % IV SOLN
510.0000 mg | Freq: Once | INTRAVENOUS | Status: AC
  Administered 2018-10-05: 510 mg via INTRAVENOUS
  Filled 2018-10-05: qty 17

## 2018-10-05 NOTE — Patient Instructions (Signed)
[facility] at [facility] Discharge Instructions  Received Feraheme infusion today. Follow-up as scheduled. Call clinic for any questions or concerns   Thank you for choosing [facility] at [facility] to provide your oncology and hematology care.  To afford each patient quality time with our provider, please arrive at least 15 minutes before your scheduled appointment time.   If you have a lab appointment with the Cancer Center please come in thru the  Main Entrance and check in at the main information desk  You need to re-schedule your appointment should you arrive 10 or more minutes late.  We strive to give you quality time with our providers, and arriving late affects you and other patients whose appointments are after yours.  Also, if you no show three or more times for appointments you may be dismissed from the clinic at the providers discretion.     Again, thank you for choosing [facility].  Our hope is that these requests will decrease the amount of time that you wait before being seen by our physicians.       _____________________________________________________________  Should you have questions after your visit to [facility], please contact our office at [phone] between the hours of 8:00 a.m. and 4:30 p.m.  Voicemails left after 4:00 p.m. will not be returned until the following business day.  For prescription refill requests, have your pharmacy contact our office and allow 72 hours.    Cancer Center Support Programs:   > Cancer Support Group  2nd Tuesday of the month 1pm-2pm, Journey Room   

## 2018-10-05 NOTE — Progress Notes (Signed)
Johnny Foster tolerated Feraheme infusion well without complaints or incident. Peripheral IV site checked with positive blood return noted prior to and after infusion. VSS upon discharge. Pt discharged self ambulatory in satisfactory condition accompanied by family memebrs

## 2018-10-10 ENCOUNTER — Telehealth (INDEPENDENT_AMBULATORY_CARE_PROVIDER_SITE_OTHER): Payer: Self-pay | Admitting: Internal Medicine

## 2018-10-10 ENCOUNTER — Encounter (INDEPENDENT_AMBULATORY_CARE_PROVIDER_SITE_OTHER): Payer: Self-pay | Admitting: Internal Medicine

## 2018-10-10 ENCOUNTER — Ambulatory Visit (INDEPENDENT_AMBULATORY_CARE_PROVIDER_SITE_OTHER): Payer: 59 | Admitting: Internal Medicine

## 2018-10-10 VITALS — BP 118/70 | HR 68 | Temp 98.6°F | Ht 70.0 in | Wt 185.3 lb

## 2018-10-10 DIAGNOSIS — D508 Other iron deficiency anemias: Secondary | ICD-10-CM

## 2018-10-10 NOTE — Telephone Encounter (Signed)
Ann, EGD,Colonoscopy. Patient is on Brlinta BID and ASA.

## 2018-10-10 NOTE — Progress Notes (Signed)
Subjective:    Patient ID: Johnny BinningJohnny Madej, male    DOB: 04-13-1956, 62 y.o.   MRN: [id number]  HPI Referred by Dr. Melton AlarHiggs for IDA. No prior hx of IDA. Received iron transfusion last week and one end of this week. 09/11/2018 Fecal occult blood test was negative.  Stools are dark with the Brlinta. Stools are not black.  No GERD.  No GI complaints, Last colonoscopy 5 yrs ago in [place]. He reports it was normal.  Appetite is good. No weight loss. Retired from [employer]:   Hx of cardiac stent and has a stent. Maintained on Brlinta since May of this year.    CBC normal 4 months ago. Per Dr. Melton AlarHiggs records, FOBT was negative.   10/10/2018 Ferritin 6, folate 12, Vitamin B12  213    CBC    Component Value Date/Time   WBC 9.0 10/02/2018 0948   RBC 4.01 (L) 10/02/2018 0948   HGB 11.1 (L) 10/02/2018 0948   HGB 11.1 (L) 09/27/2018 1300   HCT 35.3 (L) 10/02/2018 0948   HCT 36.0 (L) 09/27/2018 1300   PLT 358 10/02/2018 0948   PLT 355 09/27/2018 1300   MCV 88.0 10/02/2018 0948   MCV 88 09/27/2018 1300   MCH 27.7 10/02/2018 0948   MCHC 31.4 10/02/2018 0948   RDW 14.1 10/02/2018 0948   RDW 13.2 09/27/2018 1300   LYMPHSABS 2.1 10/02/2018 0948   LYMPHSABS 2.4 09/05/2018 0847   MONOABS 1.0 10/02/2018 0948   EOSABS 0.5 10/02/2018 0948   EOSABS 0.6 (H) 09/05/2018 0847   BASOSABS 0.1 10/02/2018 0948   BASOSABS 0.1 09/05/2018 0847    CBC Latest Ref Rng & Units 10/02/2018 09/27/2018 09/05/2018  WBC 4.0 - 10.5 K/uL 9.0 10.2 9.3  Hemoglobin 13.0 - 17.0 g/dL 11.1(L) 11.1(L) 11.8(L)  Hematocrit 39.0 - 52.0 % 35.3(L) 36.0(L) 37.0(L)  Platelets 150 - 400 K/uL 358 355 344        Review of Systems Past Medical History:  Diagnosis Date  . Hepatitis C    Treated  . Iron deficiency anemia 10/02/2018  . Myocardial infarction [facility])    May 2019.  LAD moderate 60 to 80% stenosis.  Circumflex OM occluded.  RCA 80% stenosis.  OM was thought to be a acute infarct vessel.  It was  treated with a Synergy stent.  This is a 2.5 x 20 mm, RCA was treated with a 3.  3.0 by 16 mm Synergy.  Elective PCI in June of an LAD lesion with 3.0 x 38 mm Synergy.  . 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 TONSILECTOMY, ADENOIDECTOMY, BILATERAL MYRINGOTOMY AND TUBES  1969    No Known Allergies  Current Outpatient Medications on File Prior to Visit  Medication Sig Dispense Refill  . aspirin EC 81 MG tablet Take 1 tablet (81 mg total) by mouth daily. 90 tablet 1  . atorvastatin (LIPITOR) 80 MG tablet Take 1 tablet (80 mg total) by mouth daily. 90 tablet 1  . lisinopril (PRINIVIL,ZESTRIL) 5 MG tablet Take 1 tablet (5 mg total) by mouth daily. 90 tablet 1  . metoprolol tartrate (LOPRESSOR) 25 MG tablet Take 1 tablet (25 mg total) by mouth 2 (two) times daily. 180 tablet 1  . nitroGLYCERIN (NITROSTAT) 0.4 MG SL tablet Place 1 tablet (0.4 mg total) under the tongue every 5 (five) minutes as needed for chest pain. 25 tablet  prn  . ticagrelor (BRILINTA) 90 MG TABS tablet Take 1 tablet (90 mg total) by mouth 2 (two) times daily. 180 tablet 1   No current facility-administered medications on file prior to visit.         Objective:   Physical Exam Blood pressure 118/70, pulse 68, temperature 98.6 F (37 C), height 5\' 10"  (1.778 m), weight 185 lb 4.8 oz (84.1 kg). Alert and oriented. Skin warm and dry. Oral mucosa is moist.   . Sclera anicteric, conjunctivae is pink. Thyroid not enlarged. No cervical lymphadenopathy. Lungs clear. Heart regular rate and rhythm.  Abdomen is soft. Bowel sounds are positive. No hepatomegaly. No abdominal masses felt. No tenderness.  No edema to lower extremities.           Assessment & Plan:  IDA. Discussed with Dr. Karilyn Cota. Colonic neoplasm needs to be ruled. PUD needs to be ruled out.

## 2018-10-10 NOTE — Patient Instructions (Signed)
Will discuss.

## 2018-10-11 ENCOUNTER — Encounter (INDEPENDENT_AMBULATORY_CARE_PROVIDER_SITE_OTHER): Payer: Self-pay | Admitting: *Deleted

## 2018-10-11 ENCOUNTER — Telehealth: Payer: Self-pay | Admitting: Family Medicine

## 2018-10-11 DIAGNOSIS — D649 Anemia, unspecified: Secondary | ICD-10-CM | POA: Insufficient documentation

## 2018-10-11 NOTE — Telephone Encounter (Signed)
Dr. Tamala JulianHochrein--He had synergy stents placed in [place] in May 2019 and June 2019 with recent followed up with you. Is it OK to hold brillanta 1 day, aspirin 2 days in this pt? He has iron deficiency anemia requiring blood transfusion and is getting a colonoscopy next week for evaluation, may need biopsies. Has not quite been six months since most recent stent.

## 2018-10-11 NOTE — Telephone Encounter (Signed)
TCS/EGD sch'd 10/16/18, patient aware and will come by office for prep kit & instructions Left message with Loma Sousa @ [facility] about stopping Brilinta & ASA

## 2018-10-11 NOTE — Telephone Encounter (Signed)
He is high risk for stopping DAPT at this time.

## 2018-10-12 ENCOUNTER — Telehealth (INDEPENDENT_AMBULATORY_CARE_PROVIDER_SITE_OTHER): Payer: Self-pay | Admitting: *Deleted

## 2018-10-12 ENCOUNTER — Encounter (HOSPITAL_COMMUNITY): Payer: Self-pay

## 2018-10-12 ENCOUNTER — Inpatient Hospital Stay (HOSPITAL_COMMUNITY): Payer: 59

## 2018-10-12 VITALS — BP 112/58 | HR 60 | Temp 98.3°F | Resp 16

## 2018-10-12 DIAGNOSIS — D6489 Other specified anemias: Secondary | ICD-10-CM | POA: Diagnosis not present

## 2018-10-12 DIAGNOSIS — D508 Other iron deficiency anemias: Secondary | ICD-10-CM

## 2018-10-12 MED ORDER — SODIUM CHLORIDE 0.9 % IV SOLN
Freq: Once | INTRAVENOUS | Status: AC
  Administered 2018-10-12: 14:00:00 via INTRAVENOUS

## 2018-10-12 MED ORDER — SODIUM CHLORIDE 0.9 % IV SOLN
510.0000 mg | Freq: Once | INTRAVENOUS | Status: AC
  Administered 2018-10-12: 510 mg via INTRAVENOUS
  Filled 2018-10-12: qty 17

## 2018-10-12 NOTE — Patient Instructions (Signed)
[facility] at [facility] Discharge Instructions  Received Feraheme infusion today. Follow-up as scheduled. Call clinic for any questions or concerns   Thank you for choosing [facility] at [facility] to provide your oncology and hematology care.  To afford each patient quality time with our provider, please arrive at least 15 minutes before your scheduled appointment time.   If you have a lab appointment with the Cancer Center please come in thru the  Main Entrance and check in at the main information desk  You need to re-schedule your appointment should you arrive 10 or more minutes late.  We strive to give you quality time with our providers, and arriving late affects you and other patients whose appointments are after yours.  Also, if you no show three or more times for appointments you may be dismissed from the clinic at the providers discretion.     Again, thank you for choosing [facility].  Our hope is that these requests will decrease the amount of time that you wait before being seen by our physicians.       _____________________________________________________________  Should you have questions after your visit to [facility], please contact our office at [phone] between the hours of 8:00 a.m. and 4:30 p.m.  Voicemails left after 4:00 p.m. will not be returned until the following business day.  For prescription refill requests, have your pharmacy contact our office and allow 72 hours.    Cancer Center Support Programs:   > Cancer Support Group  2nd Tuesday of the month 1pm-2pm, Journey Room   

## 2018-10-12 NOTE — Progress Notes (Signed)
Johnny Foster tolerated Feraheme infusion well without complaints or incident. Peripheral IV site checked with positive blood return noted prior to and after infusion. VSS upon discharge. Pt discharged self ambulatory in satisfactory condition

## 2018-10-12 NOTE — Telephone Encounter (Signed)
I saw note in EPIC from Dr Antoine PocheHochrein in regards to blood thinner  -- I spoke to Dr Karilyn Cotaehman about Dr Antoine PocheHochrein recommendations and per Dr Karilyn Cotaehman since patient has just been started on Brilinta we will cancel colonoscopy for now and we will check patients' H&H in 4 weeks -- patient will start Flintstone chewable bid and call us if he has any bleedingpatient is aware and we have canceled appt

## 2018-10-12 NOTE — Telephone Encounter (Signed)
He is high risk to stop brillanta and aspirin. Can procedure be delayed or be done without stopping the medications?

## 2018-10-12 NOTE — Telephone Encounter (Signed)
Per Dr Karilyn Cotaehman patient needs H&H in 4 weeks

## 2018-10-14 NOTE — Telephone Encounter (Signed)
Will postpone procedure. Should start Flintstone chewable with iron 2 tablets daily. Check H&H in 4 weeks.

## 2018-10-15 ENCOUNTER — Encounter (INDEPENDENT_AMBULATORY_CARE_PROVIDER_SITE_OTHER): Payer: Self-pay | Admitting: *Deleted

## 2018-10-15 ENCOUNTER — Other Ambulatory Visit (INDEPENDENT_AMBULATORY_CARE_PROVIDER_SITE_OTHER): Payer: Self-pay | Admitting: *Deleted

## 2018-10-15 DIAGNOSIS — D509 Iron deficiency anemia, unspecified: Secondary | ICD-10-CM

## 2018-10-15 NOTE — Telephone Encounter (Signed)
Noted for 11/12/2018. A letter will be sent to the patient as a reminder.

## 2018-10-15 NOTE — Telephone Encounter (Signed)
Lab will be due on 11/12/2018. A letter will be sent to the patient as a reminder.

## 2018-10-15 NOTE — Progress Notes (Signed)
hemo 

## 2018-10-16 ENCOUNTER — Ambulatory Visit (HOSPITAL_COMMUNITY): Payer: 59 | Admitting: Internal Medicine

## 2018-10-16 ENCOUNTER — Encounter (HOSPITAL_COMMUNITY): Payer: Self-pay

## 2018-10-16 ENCOUNTER — Ambulatory Visit (HOSPITAL_COMMUNITY): Admit: 2018-10-16 | Payer: 59 | Admitting: Internal Medicine

## 2018-10-16 SURGERY — COLONOSCOPY
Anesthesia: Moderate Sedation

## 2018-10-24 ENCOUNTER — Encounter: Payer: Self-pay | Admitting: Physician Assistant

## 2018-10-24 ENCOUNTER — Ambulatory Visit: Payer: 59 | Admitting: Physician Assistant

## 2018-10-24 VITALS — BP 111/57 | HR 70 | Temp 98.0°F | Wt 188.0 lb

## 2018-10-24 DIAGNOSIS — M79601 Pain in right arm: Secondary | ICD-10-CM

## 2018-10-24 DIAGNOSIS — G542 Cervical root disorders, not elsewhere classified: Secondary | ICD-10-CM | POA: Diagnosis not present

## 2018-10-24 MED ORDER — PREDNISONE 10 MG (48) PO TBPK: ORAL_TABLET | ORAL | 0 refills | Status: DC

## 2018-10-26 NOTE — Progress Notes (Signed)
BP (!) 111/57   Pulse 70   Temp 98 F (36.7 C)   Wt 188 lb (85.3 kg)   BMI 26.98 kg/m    Subjective:    Patient ID: Johnny BinningJohnny Foster, male    DOB: 1956-05-19, 62 y.o.   MRN: [id number]  HPI: Johnny BinningJohnny Foster is a 62 y.o. male presenting on 10/24/2018 for Shoulder Pain (right shoulder pain)  In 2018 this patient was treated through orthopedics and pain specialist.  [place] lived in [place] at that time.  We are working on getting records for him.  [place] state.  He had complete resolution of the pain that went from his lower cervical spine down the left arm.  The injection treatment given at the pain center was the most beneficial and that he even had the nerve cut, and was told that it could grow back.  At this time he is having increased pain at the same area but it is staying mainly in the back and not giving him very much arm symptoms at this time.  He would like to get back to a pain specialist to have this evaluated.  I think this is very appropriate.  We are going to give him some prednisone to take for the next few days to see if we can help give him a little bit of relief.  Past Medical History:  Diagnosis Date  . Hepatitis C    Treated  . Iron deficiency anemia 10/02/2018  . Myocardial infarction [facility](HCC)    May 2019.  LAD moderate 60 to 80% stenosis.  Circumflex OM occluded.  RCA 80% stenosis.  OM was thought to be a acute infarct vessel.  It was treated with a Synergy stent.  This is a 2.5 x 20 mm, RCA was treated with a 3.  3.0 by 16 mm Synergy.  Elective PCI in June of an LAD lesion with 3.0 x 38 mm Synergy.  . 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 Interim medical history since our last visit reviewed. Allergies and medications reviewed and updated. DATA REVIEWED: CHART IN EPIC  Family History reviewed for pertinent  findings.  Review of Systems  Constitutional: Negative.  Negative for appetite change and fatigue.  Eyes: Negative for pain and visual disturbance.  Respiratory: Negative.  Negative for cough, chest tightness, shortness of breath and wheezing.   Cardiovascular: Negative.  Negative for chest pain, palpitations and leg swelling.  Gastrointestinal: Negative.  Negative for abdominal pain, diarrhea, nausea and vomiting.  Genitourinary: Negative.   Musculoskeletal: Positive for myalgias and neck pain. Negative for neck stiffness.  Skin: Negative.  Negative for color change and rash.  Neurological: Negative.  Negative for weakness, numbness and headaches.  Psychiatric/Behavioral: Negative.     Allergies as of 10/24/2018   No Known Allergies     Medication List        Accurate as of 10/24/18 11:59 PM. Always use your most recent med list.          acetaminophen 500 MG tablet Commonly known as:  TYLENOL Take 500 mg by mouth daily as needed for moderate pain.   aspirin EC 81 MG tablet Take 1 tablet (81 mg total) by mouth daily.   atorvastatin 80 MG tablet Commonly known as:  LIPITOR Take 1 tablet (80 mg total) by mouth daily.  lisinopril 5 MG tablet Commonly known as:  PRINIVIL,ZESTRIL Take 1 tablet (5 mg total) by mouth daily.   metoprolol tartrate 25 MG tablet Commonly known as:  LOPRESSOR Take 1 tablet (25 mg total) by mouth 2 (two) times daily.   nitroGLYCERIN 0.4 MG SL tablet Commonly known as:  NITROSTAT Place 1 tablet (0.4 mg total) under the tongue every 5 (five) minutes as needed for chest pain.   predniSONE 10 MG (48) Tbpk tablet Commonly known as:  STERAPRED UNI-PAK 48 TAB Take as directed for 12 days   ticagrelor 90 MG Tabs tablet Commonly known as:  BRILINTA Take 1 tablet (90 mg total) by mouth 2 (two) times daily.          Objective:    BP (!) 111/57   Pulse 70   Temp 98 F (36.7 C)   Wt 188 lb (85.3 kg)   BMI 26.98 kg/m   No Known  Allergies  Wt Readings from Last 3 Encounters:  10/24/18 188 lb (85.3 kg)  10/10/18 185 lb 4.8 oz (84.1 kg)  10/05/18 184 lb 3.2 oz (83.6 kg)    Physical Exam  Constitutional: He appears well-developed and well-nourished. No distress.  HENT:  Head: Normocephalic and atraumatic.  Eyes: Pupils are equal, round, and reactive to light. Conjunctivae and EOM are normal.  Cardiovascular: Normal rate, regular rhythm and normal heart sounds.  Pulmonary/Chest: Effort normal and breath sounds normal. No respiratory distress.  Musculoskeletal:       Cervical back: He exhibits decreased range of motion, tenderness and spasm.       Back:  Skin: Skin is warm and dry.  Psychiatric: He has a normal mood and affect. His behavior is normal.  Nursing note and vitals reviewed.   Results for orders placed or performed in visit on 10/02/18  CBC with Differential/Platelet  Result Value Ref Range   WBC 9.0 4.0 - 10.5 K/uL   RBC 4.01 (L) 4.22 - 5.81 MIL/uL   Hemoglobin 11.1 (L) 13.0 - 17.0 g/dL   HCT 69.6 (L) 29.5 - 28.4 %   MCV 88.0 80.0 - 100.0 fL   MCH 27.7 26.0 - 34.0 pg   MCHC 31.4 30.0 - 36.0 g/dL   RDW 13.2 44.0 - 10.2 %   Platelets 358 150 - 400 K/uL   nRBC 0.0 0.0 - 0.2 %   Neutrophils Relative % 59 %   Neutro Abs 5.4 1.7 - 7.7 K/uL   Lymphocytes Relative 23 %   Lymphs Abs 2.1 0.7 - 4.0 K/uL   Monocytes Relative 11 %   Monocytes Absolute 1.0 0.1 - 1.0 K/uL   Eosinophils Relative 6 %   Eosinophils Absolute 0.5 0.0 - 0.5 K/uL   Basophils Relative 1 %   Basophils Absolute 0.1 0.0 - 0.1 K/uL   Immature Granulocytes 0 %   Abs Immature Granulocytes 0.01 0.00 - 0.07 K/uL  Comprehensive metabolic panel  Result Value Ref Range   Sodium 138 135 - 145 mmol/L   Potassium 4.5 3.5 - 5.1 mmol/L   Chloride 112 (H) 98 - 111 mmol/L   CO2 22 22 - 32 mmol/L   Glucose, Bld 98 70 - 99 mg/dL   BUN 12 6 - 20 mg/dL   Creatinine, Ser 7.25 0.61 - 1.24 mg/dL   Calcium 9.0 8.9 - 36.6 mg/dL   Total  Protein 7.5 6.5 - 8.1 g/dL   Albumin 4.3 3.5 - 5.0 g/dL   AST 22 15 - 41 U/L  ALT 20 0 - 44 U/L   Alkaline Phosphatase 57 38 - 126 U/L   Total Bilirubin 0.8 0.3 - 1.2 mg/dL   GFR calc non Af Amer >60 >60 mL/min   GFR calc Af Amer >60 >60 mL/min   Anion gap 4 (L) 5 - 15  Lactate dehydrogenase  Result Value Ref Range   LDH 118 98 - 192 U/L  Protein electrophoresis, serum  Result Value Ref Range   Total Protein ELP 6.8 6.0 - 8.5 g/dL   Albumin ELP 4.0 2.9 - 4.4 g/dL   ZOXWR-6-EAVWUJWJ 0.2 0.0 - 0.4 g/dL   XBJYN-8-GNFAOZHY 0.8 0.4 - 1.0 g/dL   Beta Globulin 1.0 0.7 - 1.3 g/dL   Gamma Globulin 0.7 0.4 - 1.8 g/dL   M-Spike, % Not Observed Not Observed g/dL   SPE Interp. Comment    Comment Comment    GLOBULIN, TOTAL 2.8 2.2 - 3.9 g/dL   A/G Ratio 1.4 0.7 - 1.7  Ferritin  Result Value Ref Range   Ferritin 6 (L) 24 - 336 ng/mL  Vitamin B12  Result Value Ref Range   Vitamin B-12 213 180 - 914 pg/mL  Folate  Result Value Ref Range   Folate 12.0 >5.9 ng/mL  Hemoglobinopathy evaluation  Result Value Ref Range   Hgb A2 Quant 2.0 1.8 - 3.2 %   Hgb F Quant 0.0 0.0 - 2.0 %   Hgb S Quant 0.0 0.0 %   Hgb C 0.0 0.0 %   Hgb A 98.0 96.4 - 98.8 %   Hgb Variant 0.0 0.0 %   Please Note: Comment   Haptoglobin  Result Value Ref Range   Haptoglobin 146 34 - 200 mg/dL  Methylmalonic acid, serum  Result Value Ref Range   Methylmalonic Acid, Quantitative 99 0 - 378 nmol/L   Disclaimer: Comment       Assessment & Plan:   1. Cervical nerve root impingement - Ambulatory referral to Pain Clinic  2. Right arm pain - Ambulatory referral to Pain Clinic   Continue all other maintenance medications as listed above.  Follow up plan: No follow-ups on file.  Educational handout given for survey  Remus Loffler PA-C [facility] [address]  [place], [place] [address] [phone]   10/26/2018, 9:04 AM

## 2018-10-31 ENCOUNTER — Ambulatory Visit (INDEPENDENT_AMBULATORY_CARE_PROVIDER_SITE_OTHER)
Admission: RE | Admit: 2018-10-31 | Discharge: 2018-10-31 | Disposition: A | Discharge status: Home / Self Care | Payer: 59 | Source: Ambulatory Visit | Attending: Acute Care | Admitting: Acute Care

## 2018-10-31 ENCOUNTER — Other Ambulatory Visit: Payer: Self-pay | Admitting: Family Medicine

## 2018-10-31 ENCOUNTER — Encounter: Payer: Self-pay | Admitting: Acute Care

## 2018-10-31 ENCOUNTER — Ambulatory Visit (INDEPENDENT_AMBULATORY_CARE_PROVIDER_SITE_OTHER): Payer: 59 | Admitting: Acute Care

## 2018-10-31 ENCOUNTER — Telehealth: Payer: Self-pay | Admitting: Acute Care

## 2018-10-31 VITALS — BP 112/60 | HR 64 | Ht 70.0 in | Wt 188.7 lb

## 2018-10-31 DIAGNOSIS — F17210 Nicotine dependence, cigarettes, uncomplicated: Secondary | ICD-10-CM

## 2018-10-31 DIAGNOSIS — R0609 Other forms of dyspnea: Principal | ICD-10-CM

## 2018-10-31 DIAGNOSIS — Z122 Encounter for screening for malignant neoplasm of respiratory organs: Secondary | ICD-10-CM

## 2018-10-31 NOTE — Progress Notes (Signed)
Shared Decision Making Visit Lung Cancer Screening Program [phone])   Eligibility:  Age 62 y.o.  Pack Years Smoking History Calculation 40 pack year smoking history (# packs/per year x # years smoked)  Recent History of coughing up blood  no  Unexplained weight loss? no ( >Than 15 pounds within the last 6 months )  Prior History Lung / other cancer no (Diagnosis within the last 5 years already requiring surveillance chest CT Scans).  Smoking Status Current Smoker  Former Smokers: Years since quit: NA  Quit Date: NA  Visit Components:  Discussion included one or more decision making aids. yes  Discussion included risk/benefits of screening. yes  Discussion included potential follow up diagnostic testing for abnormal scans. yes  Discussion included meaning and risk of over diagnosis. yes  Discussion included meaning and risk of False Positives. yes  Discussion included meaning of total radiation exposure. yes  Counseling Included:  Importance of adherence to annual lung cancer LDCT screening. yes  Impact of comorbidities on ability to participate in the program. yes  Ability and willingness to under diagnostic treatment. yes  Smoking Cessation Counseling:  Current Smokers:   Discussed importance of smoking cessation. yes  Information about tobacco cessation classes and interventions provided to patient. yes  Patient provided with "ticket" for LDCT Scan. yes  Symptomatic Patient. no  Counseling  Diagnosis Code: Tobacco Use Z72.0  Asymptomatic Patient yes  Counseling (Intermediate counseling: > three minutes counseling) U0454  Former Smokers:   Discussed the importance of maintaining cigarette abstinence. yes  Diagnosis Code: Personal History of Nicotine Dependence. U98.119  Information about tobacco cessation classes and interventions provided to patient. Yes  Patient provided with "ticket" for LDCT Scan. yes  Written Order for Lung Cancer  Screening with LDCT placed in Epic. Yes (CT Chest Lung Cancer Screening Low Dose W/O CM) [id number] Z12.2-Screening of respiratory organs Z87.891-Personal history of nicotine dependence  I have spent 25 minutes of face to face time with Johnny Foster discussing the risks and benefits of lung cancer screening. We viewed a power point together that explained in detail the above noted topics. We paused at intervals to allow for questions to be asked and answered to ensure understanding.We discussed that the single most powerful action that she can take to decrease her risk of developing lung cancer is to quit smoking. We discussed whether or not she is ready to commit to setting a quit date. We discussed options for tools to aid in quitting smoking including nicotine replacement therapy, non-nicotine medications, support groups, Quit Smart classes, and behavior modification. We discussed that often times setting smaller, more achievable goals, such as eliminating 1 cigarette a day for a week and then 2 cigarettes a day for a week can be helpful in slowly decreasing the number of cigarettes smoked. This allows for a sense of accomplishment as well as providing a clinical benefit. I gave her the " Be Stronger Than Your Excuses" card with contact information for community resources, classes, free nicotine replacement therapy, and access to mobile apps, text messaging, and on-line smoking cessation help. I have also given her my card and contact information in the event she needs to contact me. We discussed the time and location of the scan, and that either Abigail Miyamoto RN or I will call with the results within 24-48 hours of receiving them. I have offered her  a copy of the power point we viewed  as a resource in the event they need reinforcement of  the concepts we discussed today in the office. The patient verbalized understanding of all of  the above and had no further questions upon leaving the office. They have my  contact information in the event they have any further questions.  I spent 3 minutes counseling on smoking cessation and the health risks of continued tobacco abuse.  I explained to the patient that there has been a high incidence of coronary artery disease noted on these exams. I explained that this is a non-gated exam therefore degree or severity cannot be determined. This patient is currently on statin therapy. I have asked the patient to follow-up with their PCP regarding any incidental finding of coronary artery disease and management with diet or medication as their PCP  feels is clinically indicated. The patient verbalized understanding of the above and had no further questions upon completion of the visit.      Johnny NgoSarah F Matthe Sloane, NP 10/31/2018 10:22 AM

## 2018-10-31 NOTE — Telephone Encounter (Signed)
I saw Johnny Foster for Shared decision making visit for lung cancer screening today. He was interested in a pulmonary consult for worsening dyspnea with exertion. He explained that all referrals must go through your office. Would you please place a pulmonary referral for exertional dyspnea in a 40 pack year current smoker? We will let you know what his screening scan looked like once we have the results. Thanks so much

## 2018-11-01 ENCOUNTER — Other Ambulatory Visit: Payer: Self-pay | Admitting: Acute Care

## 2018-11-01 DIAGNOSIS — Z87891 Personal history of nicotine dependence: Secondary | ICD-10-CM

## 2018-11-01 DIAGNOSIS — F17210 Nicotine dependence, cigarettes, uncomplicated: Principal | ICD-10-CM

## 2018-11-01 DIAGNOSIS — Z122 Encounter for screening for malignant neoplasm of respiratory organs: Secondary | ICD-10-CM

## 2018-11-08 ENCOUNTER — Inpatient Hospital Stay (HOSPITAL_COMMUNITY): Payer: 59 | Attending: Internal Medicine

## 2018-11-08 DIAGNOSIS — I10 Essential (primary) hypertension: Secondary | ICD-10-CM | POA: Insufficient documentation

## 2018-11-08 DIAGNOSIS — F17210 Nicotine dependence, cigarettes, uncomplicated: Secondary | ICD-10-CM | POA: Insufficient documentation

## 2018-11-08 DIAGNOSIS — D508 Other iron deficiency anemias: Secondary | ICD-10-CM

## 2018-11-08 DIAGNOSIS — D509 Iron deficiency anemia, unspecified: Secondary | ICD-10-CM | POA: Insufficient documentation

## 2018-11-08 DIAGNOSIS — Z79899 Other long term (current) drug therapy: Secondary | ICD-10-CM | POA: Insufficient documentation

## 2018-11-08 DIAGNOSIS — Z7982 Long term (current) use of aspirin: Secondary | ICD-10-CM | POA: Insufficient documentation

## 2018-11-08 LAB — CBC WITH DIFFERENTIAL/PLATELET
Abs Immature Granulocytes: 0.08 10*3/uL — ABNORMAL HIGH (ref 0.00–0.07)
BASOS ABS: 0.1 10*3/uL (ref 0.0–0.1)
Basophils Relative: 1 %
Eosinophils Absolute: 0.5 10*3/uL (ref 0.0–0.5)
Eosinophils Relative: 5 %
HCT: 42.9 % (ref 39.0–52.0)
Hemoglobin: 13.4 g/dL (ref 13.0–17.0)
Immature Granulocytes: 1 %
LYMPHS ABS: 1.9 10*3/uL (ref 0.7–4.0)
LYMPHS PCT: 18 %
MCH: 30 pg (ref 26.0–34.0)
MCHC: 31.2 g/dL (ref 30.0–36.0)
MCV: 96 fL (ref 80.0–100.0)
Monocytes Absolute: 0.9 10*3/uL (ref 0.1–1.0)
Monocytes Relative: 8 %
NRBC: 0 % (ref 0.0–0.2)
Neutro Abs: 7.3 10*3/uL (ref 1.7–7.7)
Neutrophils Relative %: 67 %
PLATELETS: 257 10*3/uL (ref 150–400)
RBC: 4.47 MIL/uL (ref 4.22–5.81)
WBC: 10.7 10*3/uL — ABNORMAL HIGH (ref 4.0–10.5)

## 2018-11-08 LAB — COMPREHENSIVE METABOLIC PANEL
ALT: 19 U/L (ref 0–44)
AST: 20 U/L (ref 15–41)
Albumin: 4.2 g/dL (ref 3.5–5.0)
Alkaline Phosphatase: 51 U/L (ref 38–126)
Anion gap: 6 (ref 5–15)
BUN: 15 mg/dL (ref 6–20)
CHLORIDE: 111 mmol/L (ref 98–111)
CO2: 24 mmol/L (ref 22–32)
Calcium: 9.7 mg/dL (ref 8.9–10.3)
Creatinine, Ser: 0.82 mg/dL (ref 0.61–1.24)
GFR calc Af Amer: >60 mL/min (ref >60)
Glucose, Bld: 94 mg/dL (ref 70–99)
Potassium: 4.5 mmol/L (ref 3.5–5.1)
Sodium: 141 mmol/L (ref 135–145)
Total Bilirubin: 0.5 mg/dL (ref 0.3–1.2)
Total Protein: 6.9 g/dL (ref 6.5–8.1)

## 2018-11-08 LAB — FERRITIN: Ferritin: 30 ng/mL (ref 24–336)

## 2018-11-08 LAB — LACTATE DEHYDROGENASE: LDH: 118 U/L (ref 98–192)

## 2018-11-09 ENCOUNTER — Ambulatory Visit (HOSPITAL_COMMUNITY): Payer: 59 | Admitting: Internal Medicine

## 2018-11-09 ENCOUNTER — Encounter (HOSPITAL_COMMUNITY): Payer: Managed Care, Other (non HMO)

## 2018-11-12 ENCOUNTER — Encounter (HOSPITAL_COMMUNITY): Payer: Self-pay | Admitting: Internal Medicine

## 2018-11-12 ENCOUNTER — Other Ambulatory Visit: Payer: Self-pay

## 2018-11-12 ENCOUNTER — Inpatient Hospital Stay (HOSPITAL_COMMUNITY): Payer: 59 | Admitting: Internal Medicine

## 2018-11-12 VITALS — BP 105/66 | HR 55 | Temp 98.2°F | Resp 16 | Wt 188.2 lb

## 2018-11-12 DIAGNOSIS — I10 Essential (primary) hypertension: Secondary | ICD-10-CM | POA: Diagnosis not present

## 2018-11-12 DIAGNOSIS — Z7902 Long term (current) use of antithrombotics/antiplatelets: Secondary | ICD-10-CM | POA: Diagnosis not present

## 2018-11-12 DIAGNOSIS — D508 Other iron deficiency anemias: Secondary | ICD-10-CM

## 2018-11-12 DIAGNOSIS — F17210 Nicotine dependence, cigarettes, uncomplicated: Secondary | ICD-10-CM | POA: Diagnosis not present

## 2018-11-12 DIAGNOSIS — Z79899 Other long term (current) drug therapy: Secondary | ICD-10-CM

## 2018-11-12 DIAGNOSIS — D509 Iron deficiency anemia, unspecified: Secondary | ICD-10-CM | POA: Diagnosis not present

## 2018-11-12 NOTE — Patient Instructions (Signed)
[facility]  Discharge Instructions: You saw Dr. Higgs today                               _______________________________________________________________  Thank you for choosing [facility] to provide your oncology and hematology care.  To afford each patient quality time with our providers, please arrive at least 15 minutes before your scheduled appointment.  You need to re-schedule your appointment if you arrive 10 or more minutes late.  We strive to give you quality time with our providers, and arriving late affects you and other patients whose appointments are after yours.  Also, if you no show three or more times for appointments you may be dismissed from the clinic.  Again, thank you for choosing [facility]. Our hope is that these requests will allow you access to exceptional care and in a timely manner. _______________________________________________________________  If you have questions after your visit, please contact our office at [phone] between the hours of 8:30 a.m. and 5:00 p.m. Voicemails left after 4:30 p.m. will not be returned until the following business day. _______________________________________________________________  For prescription refill requests, have your pharmacy contact our office. _______________________________________________________________  Recommendations made by the consultant and any test results will be sent to your referring physician. _______________________________________________________________ 

## 2018-11-12 NOTE — Progress Notes (Signed)
Diagnosis Other iron deficiency anemia - Plan: CBC with Differential/Platelet, Comprehensive metabolic panel, Lactate dehydrogenase, Ferritin  Staging Cancer Staging No matching staging information was found for the patient.  Assessment and Plan:  1.  Iron deficiency anemia.  Labs done 10/02/2018 reviewed and showed WBC 9 HB 11.1 and plts 358,000.  Ferritin is decreased at 6 which is consistent with IDA.  Pt has normal SPEP, B12, folate, MMA and HB electrophoresis.  He had recent FOBT done on 09/11/2018 that was negative.   Pt was treated with IV iron 10/05/2018 and 10/12/2018.    Labs done 11/08/2018 reviewed and showed WBC 10.7 HB 13.4 Plts 257,000.  Chemistries WNl with K+ 4.5 Cr 0.82 and normal LFTs.  Ferritin slightly improved at 30.  I discussed with pt HB improved from 11 to 13 after IV iron.  Ferritin had minimal improvement and is 30.  He is advised to follow-up with GI for endoscopic evaluation.  I have discussed potential etiology of IDA as blood loss.  He will have repeat labs done 01/2019 for ongoing follow-up after IV iron.   2.  Smoking.  Pt has greater than 30 year history of smoking.  He was referred to lung cancer screening program.  Follow-up as directed.    3.  CAD. Follow-up with PCP and/or cardiology as recommended.    4.  HTN. BP is 105/66.  Follow-up with PCP.    5  Health maintenance.  Pt should follow-up with GI as directed due to IDA.   25 minutes spent with more than 50% spent in counseling and coordination of care.    Interval History:  Historical data obtained from note dated 10/02/2018:  62 year old male referred for evaluation of anemia.  He denies any blood in stool or urine.  He had labs done 09/27/2018 that showed WBC 10 HB 11.1 plts 355,000.  FOBT was negative.  He reports last GI evaluation was in [place] in 2017.  Pt has long smoking history > 30 years.  Current Status:  Pt is seen today for follow-up after IV iron.  He reports he tolerated therapy.     Problem List Patient Active Problem List   Diagnosis Date Noted  . Absolute anemia [D64.9] 10/11/2018  . Iron deficiency anemia [D50.9] 10/02/2018  . Encounter for long-term (current) use of medications [Z79.899] 09/19/2018  . Tobacco abuse [Z72.0] 09/19/2018  . ASCVD (arteriosclerotic cardiovascular disease) [I25.10] 09/09/2018  . Essential hypertension [I10] 09/09/2018  . Mixed hyperlipidemia [E78.2] 09/09/2018    Past Medical History Past Medical History:  Diagnosis Date  . Hepatitis C    Treated  . Iron deficiency anemia 10/02/2018  . Myocardial infarction [facility](HCC)    May 2019.  LAD moderate 60 to 80% stenosis.  Circumflex OM occluded.  RCA 80% stenosis.  OM was thought to be a acute infarct vessel.  It was treated with a Synergy stent.  This is a 2.5 x 20 mm, RCA was treated with a 3.  3.0 by 16 mm Synergy.  Elective PCI in June of an LAD lesion with 3.0 x 38 mm Synergy.  . 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 TONSILECTOMY, ADENOIDECTOMY, BILATERAL MYRINGOTOMY AND TUBES  1969    Family History Family History  Problem Relation Age of Onset  . Arthritis Mother   . CAD Mother 4577  . Lung disease Father   .  Cancer Brother      Social History  reports that he has been smoking cigarettes. He has a 40.00 pack-year smoking history. He has never used smokeless tobacco. He reports current alcohol use. He reports previous drug use. Drug: Other-see comments.  Medications  Current Outpatient Medications:  .  acetaminophen (TYLENOL) 500 MG tablet, Take 500 mg by mouth daily as needed for moderate pain., Disp: , Rfl:  .  aspirin EC 81 MG tablet, Take 1 tablet (81 mg total) by mouth daily., Disp: 90 tablet, Rfl: 1 .  atorvastatin (LIPITOR) 80 MG tablet, Take 1 tablet (80 mg total) by mouth daily., Disp: 90 tablet, Rfl: 1 .  lisinopril (PRINIVIL,ZESTRIL) 5 MG tablet, Take 1  tablet (5 mg total) by mouth daily., Disp: 90 tablet, Rfl: 1 .  metoprolol tartrate (LOPRESSOR) 25 MG tablet, Take 1 tablet (25 mg total) by mouth 2 (two) times daily., Disp: 180 tablet, Rfl: 1 .  nitroGLYCERIN (NITROSTAT) 0.4 MG SL tablet, Place 1 tablet (0.4 mg total) under the tongue every 5 (five) minutes as needed for chest pain., Disp: 25 tablet, Rfl: prn .  predniSONE (STERAPRED UNI-PAK 48 TAB) 10 MG (48) TBPK tablet, Take as directed for 12 days, Disp: 48 tablet, Rfl: 0 .  ticagrelor (BRILINTA) 90 MG TABS tablet, Take 1 tablet (90 mg total) by mouth 2 (two) times daily., Disp: 180 tablet, Rfl: 1  Allergies Patient has no known allergies.  Review of Systems Review of Systems - Oncology ROS negative   Physical Exam  Vitals Wt Readings from Last 3 Encounters:  11/12/18 188 lb 3.2 oz (85.4 kg)  10/31/18 188 lb 11.2 oz (85.6 kg)  10/24/18 188 lb (85.3 kg)   Temp Readings from Last 3 Encounters:  11/12/18 98.2 F (36.8 C) (Oral)  10/24/18 98 F (36.7 C)  10/12/18 98.3 F (36.8 C) (Oral)   BP Readings from Last 3 Encounters:  11/12/18 105/66  10/31/18 112/60  10/24/18 (!) 111/57   Pulse Readings from Last 3 Encounters:  11/12/18 (!) 55  10/31/18 64  10/24/18 70   Constitutional: Well-developed, well-nourished, and in no distress.   HENT: Head: Normocephalic and atraumatic.  Mouth/Throat: No oropharyngeal exudate. Mucosa moist. Eyes: Pupils are equal, round, and reactive to light. Conjunctivae are normal. No scleral icterus.  Neck: Normal range of motion. Neck supple. No JVD present.  Cardiovascular: Normal rate, regular rhythm and normal heart sounds.  Exam reveals no gallop and no friction rub.   No murmur heard. Pulmonary/Chest: Effort normal and breath sounds normal. No respiratory distress. No wheezes.No rales.  Abdominal: Soft. Bowel sounds are normal. No distension. There is no tenderness. There is no guarding.  Musculoskeletal: No edema or tenderness.   Lymphadenopathy: No cervical, axillary or supraclavicular adenopathy.  Neurological: Alert and oriented to person, place, and time. No cranial nerve deficit.  Skin: Skin is warm and dry. No rash noted. No erythema. No pallor.  Psychiatric: Affect and judgment normal.   Labs No visits with results within 3 Day(s) from this visit.  Latest known visit with results is:  Appointment on 11/08/2018  Component Date Value Ref Range Status  . WBC 11/08/2018 10.7* 4.0 - 10.5 K/uL Final  . RBC 11/08/2018 4.47  4.22 - 5.81 MIL/uL Final  . Hemoglobin 11/08/2018 13.4  13.0 - 17.0 g/dL Final  . HCT 16/08/9603 42.9  39.0 - 52.0 % Final  . MCV 11/08/2018 96.0  80.0 - 100.0 fL Final  . MCH 11/08/2018 30.0  26.0 -  34.0 pg Final  . MCHC 11/08/2018 31.2  30.0 - 36.0 g/dL Final  . RDW 04/54/098112/19/2019 Not Measured  11.5 - 15.5 % Final  . Platelets 11/08/2018 257  150 - 400 K/uL Final  . nRBC 11/08/2018 0.0  0.0 - 0.2 % Final  . Neutrophils Relative % 11/08/2018 67  % Final  . Neutro Abs 11/08/2018 7.3  1.7 - 7.7 K/uL Final  . Lymphocytes Relative 11/08/2018 18  % Final  . Lymphs Abs 11/08/2018 1.9  0.7 - 4.0 K/uL Final  . Monocytes Relative 11/08/2018 8  % Final  . Monocytes Absolute 11/08/2018 0.9  0.1 - 1.0 K/uL Final  . Eosinophils Relative 11/08/2018 5  % Final  . Eosinophils Absolute 11/08/2018 0.5  0.0 - 0.5 K/uL Final  . Basophils Relative 11/08/2018 1  % Final  . Basophils Absolute 11/08/2018 0.1  0.0 - 0.1 K/uL Final  . RBC Morphology 11/08/2018 ANISOCYTOSIS   Final  . Immature Granulocytes 11/08/2018 1  % Final  . Abs Immature Granulocytes 11/08/2018 0.08* 0.00 - 0.07 K/uL Final  . Dimorphism 11/08/2018 PRESENT   Final   Performed at [facility], [address] [place], [place] [address]  . Sodium 11/08/2018 141  135 - 145 mmol/L Final  . Potassium 11/08/2018 4.5  3.5 - 5.1 mmol/L Final  . Chloride 11/08/2018 111  98 - 111 mmol/L Final  . CO2 11/08/2018 24  22 - 32 mmol/L Final  . Glucose, Bld  11/08/2018 94  70 - 99 mg/dL Final  . BUN 82/95/621312/19/2019 15  6 - 20 mg/dL Final  . Creatinine, Ser 11/08/2018 0.82  0.61 - 1.24 mg/dL Final  . Calcium 08/65/784612/19/2019 9.7  8.9 - 10.3 mg/dL Final  . Total Protein 11/08/2018 6.9  6.5 - 8.1 g/dL Final  . Albumin 96/29/528412/19/2019 4.2  3.5 - 5.0 g/dL Final  . AST 13/24/401012/19/2019 20  15 - 41 U/L Final  . ALT 11/08/2018 19  0 - 44 U/L Final  . Alkaline Phosphatase 11/08/2018 51  38 - 126 U/L Final  . Total Bilirubin 11/08/2018 0.5  0.3 - 1.2 mg/dL Final  . GFR calc non Af Amer 11/08/2018 >60  >60 mL/min Final  . GFR calc Af Amer 11/08/2018 >60  >60 mL/min Final  . Anion gap 11/08/2018 6  5 - 15 Final   Performed at [facility], [address] [place], [place] [address]  . LDH 11/08/2018 118  98 - 192 U/L Final   Performed at [facility], [address] [place], [place] [address]  . Ferritin 11/08/2018 30  24 - 336 ng/mL Final   Performed at [facility], [address] [place], [place] [address]     Pathology Orders Placed This Encounter  Procedures  . CBC with Differential/Platelet    Standing Status:   Future    Standing Expiration Date:   11/12/2020  . Comprehensive metabolic panel    Standing Status:   Future    Standing Expiration Date:   11/12/2020  . Lactate dehydrogenase    Standing Status:   Future    Standing Expiration Date:   11/12/2020  . Ferritin    Standing Status:   Future    Standing Expiration Date:   11/12/2020       Ahmed PrimaVetta Briell Paulette MD

## 2018-11-26 ENCOUNTER — Other Ambulatory Visit: Payer: Self-pay | Admitting: Family Medicine

## 2018-11-28 ENCOUNTER — Other Ambulatory Visit: Payer: Self-pay | Admitting: Family Medicine

## 2018-12-07 ENCOUNTER — Other Ambulatory Visit: Payer: Self-pay | Admitting: Family Medicine

## 2018-12-10 ENCOUNTER — Ambulatory Visit: Payer: BLUE CROSS/BLUE SHIELD | Admitting: Family Medicine

## 2018-12-10 ENCOUNTER — Encounter: Payer: Self-pay | Admitting: Family Medicine

## 2018-12-10 VITALS — BP 103/59 | HR 60 | Temp 97.5°F | Ht 70.0 in | Wt 192.5 lb

## 2018-12-10 DIAGNOSIS — I10 Essential (primary) hypertension: Secondary | ICD-10-CM

## 2018-12-10 DIAGNOSIS — E782 Mixed hyperlipidemia: Secondary | ICD-10-CM | POA: Diagnosis not present

## 2018-12-10 DIAGNOSIS — D508 Other iron deficiency anemias: Secondary | ICD-10-CM

## 2018-12-10 MED ORDER — METOPROLOL TARTRATE 25 MG PO TABS: 25.0000 mg | ORAL_TABLET | Freq: Two times a day (BID) | ORAL | 1 refills | Status: DC

## 2018-12-10 MED ORDER — CLOTRIMAZOLE-BETAMETHASONE 1-0.05 % EX CREA: 1.0000 "application " | TOPICAL_CREAM | Freq: Two times a day (BID) | CUTANEOUS | 1 refills | Status: DC

## 2018-12-10 MED ORDER — LISINOPRIL 5 MG PO TABS: 5.0000 mg | ORAL_TABLET | Freq: Every day | ORAL | 1 refills | Status: DC

## 2018-12-10 MED ORDER — TICAGRELOR 90 MG PO TABS: 90.0000 mg | ORAL_TABLET | Freq: Two times a day (BID) | ORAL | 1 refills | Status: DC

## 2018-12-10 MED ORDER — ATORVASTATIN CALCIUM 80 MG PO TABS: 80.0000 mg | ORAL_TABLET | Freq: Every day | ORAL | 1 refills | Status: DC

## 2018-12-10 NOTE — Progress Notes (Signed)
Subjective:  Patient ID: Johnny Foster, male    DOB: Dec 18, 1955  Age: 63 y.o. MRN: [id number]  CC: Medical Management of Chronic Issues   HPI Jaking Thayer presents for  follow-up of hypertension. Patient has no history of headache chest pain or shortness of breath or recent cough. Patient also denies symptoms of TIA such as focal numbness or weakness. Patient denies side effects from medication. States taking it regularly. Patient in for follow-up of elevated cholesterol. Doing well without complaints on current medication. Denies side effects of statin including myalgia and arthralgia and nausea. Also in today for liver function testing. Currently no chest pain, shortness of breath or other cardiovascular related symptoms noted.  History Taiga has a past medical history of Hepatitis C, Iron deficiency anemia (10/02/2018), Myocardial infarction [facility]), and Substance abuse ([place]).   He has a past surgical history that includes Tonsilectomy, adenoidectomy, bilateral myringotomy and tubes (1969).   His family history includes Arthritis in his mother; CAD (age of onset: 64) in his mother; Cancer in his brother; Lung disease in his father.He reports that he has been smoking cigarettes. He has a 40.00 pack-year smoking history. He has never used smokeless tobacco. He reports current alcohol use. He reports previous drug use. Drug: Other-see comments.  Current Outpatient Medications on File Prior to Visit  Medication Sig Dispense Refill  . acetaminophen (TYLENOL) 500 MG tablet Take 500 mg by mouth daily as needed for moderate pain.    . ASPIRIN LOW DOSE 81 MG EC tablet TAKE 1 TABLET BY MOUTH EVERY DAY 90 tablet 0  . nitroGLYCERIN (NITROSTAT) 0.4 MG SL tablet Place 1 tablet (0.4 mg total) under the tongue every 5 (five) minutes as needed for chest pain. 25 tablet prn   No current facility-administered medications on file prior to visit.     ROS Review of Systems  Constitutional: Negative.     HENT: Negative.   Eyes: Negative for visual disturbance.  Respiratory: Negative for cough and shortness of breath.   Cardiovascular: Negative for chest pain and leg swelling.  Gastrointestinal: Positive for abdominal pain. Negative for diarrhea, nausea and vomiting.  Genitourinary: Negative for difficulty urinating.  Musculoskeletal: Negative for arthralgias and myalgias.  Skin: Negative for rash.  Neurological: Negative for headaches.  Psychiatric/Behavioral: Negative for sleep disturbance.    Objective:  BP (!) 103/59   Pulse 60   Temp (!) 97.5 F (36.4 C) (Oral)   Ht _0  (1.778 m)   Wt 192 lb 8 oz (87.3 kg)   BMI 27.62 kg/m   BP Readings from Last 3 Encounters:  12/10/18 (!) 103/59  11/12/18 105/66  10/31/18 112/60    Wt Readings from Last 3 Encounters:  12/10/18 192 lb 8 oz (87.3 kg)  11/12/18 188 lb 3.2 oz (85.4 kg)  10/31/18 188 lb 11.2 oz (85.6 kg)     Physical Exam Constitutional:      General: He is not in acute distress.    Appearance: He is well-developed.  HENT:     Head: Normocephalic and atraumatic.     Right Ear: External ear normal.     Left Ear: External ear normal.     Nose: Nose normal.  Eyes:     Conjunctiva/sclera: Conjunctivae normal.     Pupils: Pupils are equal, round, and reactive to light.  Neck:     Musculoskeletal: Normal range of motion and neck supple.  Cardiovascular:     Rate and Rhythm: Normal rate and regular rhythm.  Heart sounds: Normal heart sounds. No murmur.  Pulmonary:     Effort: Pulmonary effort is normal. No respiratory distress.     Breath sounds: Normal breath sounds. No wheezing or rales.  Abdominal:     Palpations: Abdomen is soft.     Tenderness: There is no abdominal tenderness.  Musculoskeletal: Normal range of motion.  Skin:    General: Skin is warm and dry.  Neurological:     Mental Status: He is alert and oriented to person, place, and time.     Deep Tendon Reflexes: Reflexes are normal and  symmetric.  Psychiatric:        Behavior: Behavior normal.        Thought Content: Thought content normal.        Judgment: Judgment normal.       Assessment & Plan:   Burtis was seen today for medical management of chronic issues.  Diagnoses and all orders for this visit:  Other iron deficiency anemia -     Iron, TIBC and Ferritin Panel -     Lactate Dehydrogenase  Essential hypertension  Mixed hyperlipidemia -     CBC with Differential/Platelet -     CMP14+EGFR -     Lipid panel  Other orders -     atorvastatin (LIPITOR) 80 MG tablet; Take 1 tablet (80 mg total) by mouth daily. -     lisinopril (PRINIVIL,ZESTRIL) 5 MG tablet; Take 1 tablet (5 mg total) by mouth daily. -     metoprolol tartrate (LOPRESSOR) 25 MG tablet; Take 1 tablet (25 mg total) by mouth 2 (two) times daily. -     ticagrelor (BRILINTA) 90 MG TABS tablet; Take 1 tablet (90 mg total) by mouth 2 (two) times daily. -     clotrimazole-betamethasone (LOTRISONE) cream; Apply 1 application topically 2 (two) times daily. To affected areas until rash clears   Allergies as of 12/10/2018   No Known Allergies     Medication List       Accurate as of December 10, 2018 12:03 PM. Always use your most recent med list.        acetaminophen 500 MG tablet Commonly known as:  TYLENOL Take 500 mg by mouth daily as needed for moderate pain.   ASPIRIN LOW DOSE 81 MG EC tablet Generic drug:  aspirin TAKE 1 TABLET BY MOUTH EVERY DAY   atorvastatin 80 MG tablet Commonly known as:  LIPITOR Take 1 tablet (80 mg total) by mouth daily.   clotrimazole-betamethasone cream Commonly known as:  LOTRISONE Apply 1 application topically 2 (two) times daily. To affected areas until rash clears   lisinopril 5 MG tablet Commonly known as:  PRINIVIL,ZESTRIL Take 1 tablet (5 mg total) by mouth daily.   metoprolol tartrate 25 MG tablet Commonly known as:  LOPRESSOR Take 1 tablet (25 mg total) by mouth 2 (two) times daily.     nitroGLYCERIN 0.4 MG SL tablet Commonly known as:  NITROSTAT Place 1 tablet (0.4 mg total) under the tongue every 5 (five) minutes as needed for chest pain.   ticagrelor 90 MG Tabs tablet Commonly known as:  BRILINTA Take 1 tablet (90 mg total) by mouth 2 (two) times daily.       Meds ordered this encounter  Medications  . atorvastatin (LIPITOR) 80 MG tablet    Sig: Take 1 tablet (80 mg total) by mouth daily.    Dispense:  90 tablet    Refill:  1  . lisinopril (PRINIVIL,ZESTRIL)  5 MG tablet    Sig: Take 1 tablet (5 mg total) by mouth daily.    Dispense:  90 tablet    Refill:  1  . metoprolol tartrate (LOPRESSOR) 25 MG tablet    Sig: Take 1 tablet (25 mg total) by mouth 2 (two) times daily.    Dispense:  180 tablet    Refill:  1  . ticagrelor (BRILINTA) 90 MG TABS tablet    Sig: Take 1 tablet (90 mg total) by mouth 2 (two) times daily.    Dispense:  180 tablet    Refill:  1  . clotrimazole-betamethasone (LOTRISONE) cream    Sig: Apply 1 application topically 2 (two) times daily. To affected areas until rash clears    Dispense:  45 g    Refill:  1     Follow-up: Return in about 6 months (around 06/10/2019).  Claretta Fraise, M.D.

## 2018-12-11 LAB — CMP14+EGFR
A/G RATIO: 2.3 — AB (ref 1.2–2.2)
ALT: 12 IU/L (ref 0–44)
AST: 10 IU/L (ref 0–40)
Albumin: 4.3 g/dL (ref 3.8–4.9)
Alkaline Phosphatase: 61 IU/L (ref 39–117)
BUN/Creatinine Ratio: 13 (ref 10–24)
BUN: 11 mg/dL (ref 8–27)
Bilirubin Total: <0.2 mg/dL (ref 0.0–1.2)
CALCIUM: 9.1 mg/dL (ref 8.6–10.2)
CO2: 18 mmol/L — AB (ref 20–29)
Chloride: 109 mmol/L — ABNORMAL HIGH (ref 96–106)
Creatinine, Ser: 0.86 mg/dL (ref 0.76–1.27)
GFR calc Af Amer: 109 mL/min/{1.73_m2} (ref >59)
GFR calc non Af Amer: 94 mL/min/{1.73_m2} (ref >59)
GLUCOSE: 103 mg/dL — AB (ref 65–99)
Globulin, Total: 1.9 g/dL (ref 1.5–4.5)
Potassium: 4.5 mmol/L (ref 3.5–5.2)
Sodium: 140 mmol/L (ref 134–144)
Total Protein: 6.2 g/dL (ref 6.0–8.5)

## 2018-12-11 LAB — IRON,TIBC AND FERRITIN PANEL
Ferritin: 16 ng/mL — ABNORMAL LOW (ref 30–400)
IRON: 32 ug/dL — AB (ref 38–169)
Iron Saturation: 8 % — CL (ref 15–55)
Total Iron Binding Capacity: 383 ug/dL (ref 250–450)
UIBC: 351 ug/dL — ABNORMAL HIGH (ref 111–343)

## 2018-12-11 LAB — CBC WITH DIFFERENTIAL/PLATELET
Basophils Absolute: 0.1 10*3/uL (ref 0.0–0.2)
Basos: 1 %
EOS (ABSOLUTE): 0.5 10*3/uL — AB (ref 0.0–0.4)
Eos: 6 %
HEMOGLOBIN: 12.7 g/dL — AB (ref 13.0–17.7)
Hematocrit: 38.3 % (ref 37.5–51.0)
IMMATURE GRANS (ABS): 0 10*3/uL (ref 0.0–0.1)
Immature Granulocytes: 0 %
Lymphocytes Absolute: 2.4 10*3/uL (ref 0.7–3.1)
Lymphs: 26 %
MCH: 31.1 pg (ref 26.6–33.0)
MCHC: 33.2 g/dL (ref 31.5–35.7)
MCV: 94 fL (ref 79–97)
Monocytes Absolute: 0.9 10*3/uL (ref 0.1–0.9)
Monocytes: 9 %
Neutrophils Absolute: 5.4 10*3/uL (ref 1.4–7.0)
Neutrophils: 58 %
PLATELETS: 276 10*3/uL (ref 150–450)
RBC: 4.09 x10E6/uL — ABNORMAL LOW (ref 4.14–5.80)
RDW: 17 % — ABNORMAL HIGH (ref 11.6–15.4)
WBC: 9.3 10*3/uL (ref 3.4–10.8)

## 2018-12-11 LAB — LIPID PANEL
Chol/HDL Ratio: 2 ratio (ref 0.0–5.0)
Cholesterol, Total: 110 mg/dL (ref 100–199)
HDL: 54 mg/dL (ref >39)
LDL Calculated: 45 mg/dL (ref 0–99)
TRIGLYCERIDES: 56 mg/dL (ref 0–149)
VLDL Cholesterol Cal: 11 mg/dL (ref 5–40)

## 2018-12-11 LAB — LACTATE DEHYDROGENASE: LDH: 152 IU/L (ref 121–224)

## 2018-12-14 ENCOUNTER — Other Ambulatory Visit: Payer: Self-pay | Admitting: *Deleted

## 2018-12-14 MED ORDER — FERROUS FUMARATE 325 (106 FE) MG PO TABS: 1.0000 | ORAL_TABLET | Freq: Two times a day (BID) | ORAL | 5 refills | Status: DC

## 2018-12-21 ENCOUNTER — Encounter: Payer: Self-pay | Admitting: *Deleted

## 2018-12-24 NOTE — Progress Notes (Signed)
Cardiology Office Note   Date:  12/26/2018   ID:  Johnny BinningJohnny Spohr, DOB January 20, 1956, MRN [id number]  PCP:  Mechele ClaudeStacks, Warren, MD  Cardiologist:   No primary care provider on file. Referring:  Mechele ClaudeStacks, Warren, MD   Chief Complaint  Patient presents with  . Coronary Artery Disease      History of Present Illness: Johnny Foster is a 63 y.o. male who presents for follow up of CAD  He had a cardiac cath in June 2019 in [place] after presenting with a acute inferolateral MI.  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  EF was normal.    The anatomy is detailed below.  At the last visit he was doing okay.  He has had anemia iron deficiency.  I held off on suggesting that he be okay for colonoscopy as I wanted him to continue his Brilinta.  He is on iron and his blood counts are up I noticed.  He started exercising.  He is had a lot of stress with deaths in his family. The patient denies any new symptoms such as chest discomfort, neck or arm discomfort. There has been no new shortness of breath, PND or orthopnea. There have been no reported palpitations, presyncope or syncope.  He is planning on stopping smoking and was given a prescription for Chantix yesterday.   Past Medical History:  Diagnosis Date  . Hepatitis C    Treated  . Iron deficiency anemia 10/02/2018  . Myocardial infarction [facility](HCC)    May 2019.  LAD moderate 60 to 80% stenosis.  Circumflex OM occluded.  RCA 80% stenosis.  OM was thought to be a acute infarct vessel.  It was treated with a Synergy stent.  This is a 2.5 x 20 mm, RCA was treated with a 3.  3.0 by 16 mm Synergy.  Elective PCI in June of an LAD lesion with 3.0 x 38 mm Synergy.  . 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 TONSILECTOMY, ADENOIDECTOMY, BILATERAL MYRINGOTOMY AND TUBES  1969     Current Outpatient  Medications  Medication Sig Dispense Refill  . acetaminophen (TYLENOL) 500 MG tablet Take 500 mg by mouth daily as needed for moderate pain.    . ASPIRIN LOW DOSE 81 MG EC tablet TAKE 1 TABLET BY MOUTH EVERY DAY 90 tablet 0  . atorvastatin (LIPITOR) 80 MG tablet Take 1 tablet (80 mg total) by mouth daily. 90 tablet 1  . clotrimazole-betamethasone (LOTRISONE) cream Apply 1 application topically 2 (two) times daily. To affected areas until rash clears 45 g 1  . ferrous fumarate (HEMOCYTE - 106 MG FE) 325 (106 Fe) MG TABS tablet Take 1 tablet (106 mg of iron total) by mouth 2 (two) times daily. 60 each 5  . lisinopril (PRINIVIL,ZESTRIL) 5 MG tablet Take 1 tablet (5 mg total) by mouth daily. 90 tablet 1  . metoprolol tartrate (LOPRESSOR) 25 MG tablet Take 1 tablet (25 mg total) by mouth 2 (two) times daily. 180 tablet 1  . nitroGLYCERIN (NITROSTAT) 0.4 MG SL tablet Place 1 tablet (0.4 mg total) under the tongue every 5 (five) minutes as needed for chest pain. 25 tablet prn  . ticagrelor (BRILINTA) 90 MG TABS tablet Take 1 tablet (90 mg total) by mouth 2 (two) times daily. 180 tablet 1  .  varenicline (CHANTIX CONTINUING MONTH PAK) 1 MG tablet Take 1 tablet (1 mg total) by mouth 2 (two) times daily. 60 tablet 5  . varenicline (CHANTIX STARTING MONTH PAK) 0.5 MG X 11 & 1 MG X 42 tablet Use according to package directions 53 tablet 0   No current facility-administered medications for this visit.     Allergies:   Patient has no known allergies.    ROS:  Please see the history of present illness.   Otherwise, review of systems are positive for none.   All other systems are reviewed and negative.    PHYSICAL EXAM: VS:  BP 100/64   Pulse 64   Ht 5\' 10"  (1.778 m)   Wt 189 lb (85.7 kg)   BMI 27.12 kg/m  , BMI Body mass index is 27.12 kg/m. GENERAL:  Well appearing NECK:  No jugular venous distention, waveform within normal limits, carotid upstroke brisk and symmetric, no bruits, no  thyromegaly LUNGS:  Clear to auscultation bilaterally CHEST:  Unremarkable HEART:  PMI not displaced or sustained,S1 and S2 within normal limits, no S3, no S4, no clicks, no rubs, no murmurs ABD:  Flat, positive bowel sounds normal in frequency in pitch, no bruits, no rebound, no guarding, no midline pulsatile mass, no hepatomegaly, no splenomegaly EXT:  2 plus pulses throughout, no edema, no cyanosis no clubbing   EKG:  EKG is not ordered today.    Recent Labs: 12/10/2018: ALT 12 12/25/2018: BUN 11; Creatinine, Ser 0.83; Hemoglobin WILL FOLLOW; Platelets WILL FOLLOW; Potassium 4.6; Sodium 143    Lipid Panel    Component Value Date/Time   CHOL 110 12/10/2018 0848   TRIG 56 12/10/2018 0848   HDL 54 12/10/2018 0848   CHOLHDL 2.0 12/10/2018 0848   LDLCALC 45 12/10/2018 0848      Wt Readings from Last 3 Encounters:  12/26/18 189 lb (85.7 kg)  12/25/18 189 lb (85.7 kg)  12/10/18 192 lb 8 oz (87.3 kg)      Other studies Reviewed: Additional studies/ records that were reviewed today include:  Labs Review of the above records demonstrates:  Please see elsewhere in the note.     ASSESSMENT AND PLAN:   CAD:    The patient has no new sypmtoms.  No further cardiovascular testing is indicated.  We will continue with aggressive risk reduction and meds as listed.  I will see him back in June at which point I will likely stop his Brilinta.  TOBACCO ABUSE : He has not started Chantix yet but I agree with this and we discussed the risks benefits.   DYSLIPIDEMIA: Lipids are excellent.  LDL was 45.  No change in therapy.     Current medicines are reviewed at length with the patient today.  The patient does not have concerns regarding medicines.  The following changes have been made:  None  Labs/ tests ordered today include: None  No orders of the defined types were placed in this encounter.    Disposition:   FU with me in June.  Signed, Rollene Rotunda, MD  12/26/2018 11:03 AM     [facility]

## 2018-12-25 ENCOUNTER — Ambulatory Visit (INDEPENDENT_AMBULATORY_CARE_PROVIDER_SITE_OTHER): Payer: BLUE CROSS/BLUE SHIELD | Admitting: Family Medicine

## 2018-12-25 ENCOUNTER — Encounter: Payer: Self-pay | Admitting: Family Medicine

## 2018-12-25 VITALS — BP 111/73 | HR 62 | Temp 97.3°F | Resp 18 | Ht 70.0 in | Wt 189.0 lb

## 2018-12-25 DIAGNOSIS — I2510 Atherosclerotic heart disease of native coronary artery without angina pectoris: Secondary | ICD-10-CM | POA: Diagnosis not present

## 2018-12-25 DIAGNOSIS — I10 Essential (primary) hypertension: Secondary | ICD-10-CM | POA: Diagnosis not present

## 2018-12-25 DIAGNOSIS — D508 Other iron deficiency anemias: Secondary | ICD-10-CM

## 2018-12-25 MED ORDER — VARENICLINE TARTRATE 0.5 MG X 11 & 1 MG X 42 PO MISC: ORAL | 0 refills | Status: DC

## 2018-12-25 MED ORDER — VARENICLINE TARTRATE 1 MG PO TABS: 1.0000 mg | ORAL_TABLET | Freq: Two times a day (BID) | ORAL | 5 refills | Status: DC

## 2018-12-25 NOTE — Progress Notes (Signed)
Chief Complaint  Patient presents with  . Pulmonary function test    HPI  Patient presents today for evaluation for COPD.  He is very concerned about anemia and its source. He can not undergo colonoscopy ecause his recent heart attack and stent placement precludes discontinuation temporarily of his anticoagulant for the test. Pt. Has reduced smoking. Still smoking five a day. HE would like to quit.  PMH: Smoking status noted ROS: Review of Systems  Constitutional: Negative for fever.  Respiratory: Positive for shortness of breath.   Cardiovascular: Negative for chest pain.  Musculoskeletal: Negative for arthralgias.  Skin: Negative for rash.  Psychiatric/Behavioral: Negative for confusion and sleep disturbance. The patient is nervous/anxious.      Objective: BP 111/73   Pulse 62   Temp (!) 97.3 F (36.3 C) (Oral)   Resp 18   Ht 5' 10"  (1.778 m)   Wt 189 lb (85.7 kg)   SpO2 97%   BMI 27.12 kg/m  Gen: NAD, alert, cooperative with exam HEENT: NCAT, EOMI, PERRL CV: RRR, good S1/S2, no murmur Resp: CTABL, no wheezes, non-labored Abd: SNTND, BS present, no guarding or organomegaly Ext: No edema, warm Neuro: Alert and oriented, No gross deficits Pre & Post albuterol Neb, shows nml function Assessment and plan:  1. ASCVD (arteriosclerotic cardiovascular disease)   2. Other iron deficiency anemia   3. Essential hypertension     Meds ordered this encounter  Medications  . varenicline (CHANTIX STARTING MONTH PAK) 0.5 MG X 11 & 1 MG X 42 tablet    Sig: Use according to package directions    Dispense:  53 tablet    Refill:  0  . varenicline (CHANTIX CONTINUING MONTH PAK) 1 MG tablet    Sig: Take 1 tablet (1 mg total) by mouth 2 (two) times daily.    Dispense:  60 tablet    Refill:  5    Fill after pt. Finishes starter please    Orders Placed This Encounter  Procedures  . BMP8+EGFR  . CBC with Differential/Platelet  . PR EVAL OF BRONCHOSPASM    Follow up in 3 mos &  PRN  Claretta Fraise, MD

## 2018-12-26 ENCOUNTER — Encounter: Payer: Self-pay | Admitting: Cardiology

## 2018-12-26 ENCOUNTER — Ambulatory Visit: Payer: BLUE CROSS/BLUE SHIELD | Admitting: Cardiology

## 2018-12-26 VITALS — BP 100/64 | HR 64 | Ht 70.0 in | Wt 189.0 lb

## 2018-12-26 DIAGNOSIS — E785 Hyperlipidemia, unspecified: Secondary | ICD-10-CM | POA: Diagnosis not present

## 2018-12-26 DIAGNOSIS — Z720 Tobacco use: Secondary | ICD-10-CM | POA: Diagnosis not present

## 2018-12-26 DIAGNOSIS — I2510 Atherosclerotic heart disease of native coronary artery without angina pectoris: Secondary | ICD-10-CM | POA: Diagnosis not present

## 2018-12-26 LAB — SPECIMEN STATUS REPORT

## 2018-12-26 NOTE — Patient Instructions (Signed)
Medication Instructions:  The current medical regimen is effective;  continue present plan and medications.  If you need a refill on your cardiac medications before your next appointment, please call your pharmacy.   Follow-Up: Follow up in June with Dr Antoine Poche in [place].  Thank you for choosing [facility]!!     [phone])

## 2018-12-27 LAB — CBC WITH DIFFERENTIAL/PLATELET
Basophils Absolute: 0.1 10*3/uL (ref 0.0–0.2)
Basos: 1 %
EOS (ABSOLUTE): 0.5 10*3/uL — ABNORMAL HIGH (ref 0.0–0.4)
Eos: 6 %
Hematocrit: 40 % (ref 37.5–51.0)
Hemoglobin: 13.6 g/dL (ref 13.0–17.7)
IMMATURE GRANULOCYTES: 0 %
Immature Grans (Abs): 0 10*3/uL (ref 0.0–0.1)
LYMPHS: 23 %
Lymphocytes Absolute: 2 10*3/uL (ref 0.7–3.1)
MCH: 31.7 pg (ref 26.6–33.0)
MCHC: 34 g/dL (ref 31.5–35.7)
MCV: 93 fL (ref 79–97)
Monocytes Absolute: 0.9 10*3/uL (ref 0.1–0.9)
Monocytes: 11 %
Neutrophils Absolute: 5.3 10*3/uL (ref 1.4–7.0)
Neutrophils: 59 %
Platelets: 283 10*3/uL (ref 150–450)
RBC: 4.29 x10E6/uL (ref 4.14–5.80)
RDW: 16.2 % — ABNORMAL HIGH (ref 11.6–15.4)
WBC: 8.8 10*3/uL (ref 3.4–10.8)

## 2018-12-27 LAB — BMP8+EGFR
BUN/Creatinine Ratio: 13 (ref 10–24)
BUN: 11 mg/dL (ref 8–27)
CALCIUM: 9.3 mg/dL (ref 8.6–10.2)
CO2: 20 mmol/L (ref 20–29)
Chloride: 108 mmol/L — ABNORMAL HIGH (ref 96–106)
Creatinine, Ser: 0.83 mg/dL (ref 0.76–1.27)
GFR calc Af Amer: 111 mL/min/{1.73_m2} (ref >59)
GFR calc non Af Amer: 96 mL/min/{1.73_m2} (ref >59)
Glucose: 99 mg/dL (ref 65–99)
Potassium: 4.6 mmol/L (ref 3.5–5.2)
Sodium: 143 mmol/L (ref 134–144)

## 2018-12-28 NOTE — Progress Notes (Signed)
Hello Emilliano,  Your lab result is normal.Some minor variations that are not significant are commonly marked abnormal, but do not represent any medical problem for you.  Best regards, Chantille Navarrete, M.D.

## 2019-01-07 ENCOUNTER — Telehealth (INDEPENDENT_AMBULATORY_CARE_PROVIDER_SITE_OTHER): Payer: Self-pay | Admitting: *Deleted

## 2019-01-07 NOTE — Telephone Encounter (Addendum)
Per Dr Karilyn Cota -- please call patient and let him know his H&H was normal 12/25/18, he should continue 1 [place] vitamin and call our office to reschedule TCS once he is cleared by cardiologist to stop Brilinta -- Tammy will you call patient, I don't feel comfortable doing it

## 2019-01-09 ENCOUNTER — Encounter: Payer: Self-pay | Admitting: Family Medicine

## 2019-01-09 ENCOUNTER — Ambulatory Visit (INDEPENDENT_AMBULATORY_CARE_PROVIDER_SITE_OTHER): Payer: BLUE CROSS/BLUE SHIELD | Admitting: Family Medicine

## 2019-01-09 VITALS — BP 123/71 | HR 67 | Temp 97.4°F | Ht 70.0 in | Wt 190.2 lb

## 2019-01-09 DIAGNOSIS — G5601 Carpal tunnel syndrome, right upper limb: Secondary | ICD-10-CM | POA: Diagnosis not present

## 2019-01-09 NOTE — Progress Notes (Signed)
Chief Complaint  Patient presents with  . Numbness    Fingers, right hand    HPI  Patient presents today for numbness. Located at right wrist radiating to the tips of fingers 2-4.  Occurred 2 days ago awakening him in the night. Continued through the next day, but he took ibuprofen and naproxen with relief. Now no pain or numbness, but has had previous, lesser episodes in the past.   PMH: Smoking status noted ROS: Per HPI  Objective: BP 123/71   Pulse 67   Temp (!) 97.4 F (36.3 C) (Oral)   Ht 5\' 10"  (1.778 m)   Wt 190 lb 3.2 oz (86.3 kg)   BMI 27.29 kg/m  Gen: NAD, alert, cooperative with exam HEENT: NCAT, EOMI, PERRL CV: RRR, good S1/S2, no murmur Resp: CTABL, no wheezes, non-labored Abd: SNTND, BS present, no guarding or organomegaly Ext: No edema, warm Neuro: Alert and oriented, No gross deficits. Negative Tinel and Phalen, RUE wrist.  Assessment and plan:  No diagnosis found.  No orders of the defined types were placed in this encounter.   Wear wrist brace at night. Avoid OTC NSAIDS due to use of Brilinta.   Follow up as needed.  Mechele Claude, MD

## 2019-01-09 NOTE — Telephone Encounter (Signed)
Patient was called and given Dr.Rehman's recommendation. He states that he saw Dr.Stack and the doctor put him on a Iron Pill, and his FE has gone up even more since having started it. He will call our office to reschedule his TCS when he is cleared from Cardiologist to stop the Brilinta.

## 2019-02-05 ENCOUNTER — Other Ambulatory Visit: Payer: Self-pay

## 2019-02-05 ENCOUNTER — Inpatient Hospital Stay (HOSPITAL_COMMUNITY): Payer: BLUE CROSS/BLUE SHIELD | Attending: Hematology

## 2019-02-05 DIAGNOSIS — D509 Iron deficiency anemia, unspecified: Secondary | ICD-10-CM | POA: Insufficient documentation

## 2019-02-05 DIAGNOSIS — Z79899 Other long term (current) drug therapy: Secondary | ICD-10-CM | POA: Diagnosis not present

## 2019-02-05 DIAGNOSIS — D508 Other iron deficiency anemias: Secondary | ICD-10-CM

## 2019-02-05 LAB — COMPREHENSIVE METABOLIC PANEL
ALT: 21 U/L (ref 0–44)
AST: 18 U/L (ref 15–41)
Albumin: 4.5 g/dL (ref 3.5–5.0)
Alkaline Phosphatase: 57 U/L (ref 38–126)
Anion gap: 7 (ref 5–15)
BUN: 18 mg/dL (ref 6–20)
CO2: 24 mmol/L (ref 22–32)
Calcium: 9.4 mg/dL (ref 8.9–10.3)
Chloride: 111 mmol/L (ref 98–111)
Creatinine, Ser: 0.8 mg/dL (ref 0.61–1.24)
GFR calc Af Amer: >60 mL/min (ref >60)
GFR calc non Af Amer: >60 mL/min (ref >60)
Glucose, Bld: 97 mg/dL (ref 70–99)
Potassium: 4.7 mmol/L (ref 3.5–5.1)
Sodium: 142 mmol/L (ref 135–145)
Total Bilirubin: 0.5 mg/dL (ref 0.3–1.2)
Total Protein: 7.4 g/dL (ref 6.5–8.1)

## 2019-02-05 LAB — CBC WITH DIFFERENTIAL/PLATELET
Abs Immature Granulocytes: 0.03 10*3/uL (ref 0.00–0.07)
Basophils Absolute: 0.1 10*3/uL (ref 0.0–0.1)
Basophils Relative: 1 %
EOS ABS: 0.6 10*3/uL — AB (ref 0.0–0.5)
Eosinophils Relative: 6 %
HCT: 45.6 % (ref 39.0–52.0)
Hemoglobin: 14.6 g/dL (ref 13.0–17.0)
Immature Granulocytes: 0 %
Lymphocytes Relative: 20 %
Lymphs Abs: 1.9 10*3/uL (ref 0.7–4.0)
MCH: 31.8 pg (ref 26.0–34.0)
MCHC: 32 g/dL (ref 30.0–36.0)
MCV: 99.3 fL (ref 80.0–100.0)
Monocytes Absolute: 0.9 10*3/uL (ref 0.1–1.0)
Monocytes Relative: 9 %
Neutro Abs: 6.2 10*3/uL (ref 1.7–7.7)
Neutrophils Relative %: 64 %
Platelets: 281 10*3/uL (ref 150–400)
RBC: 4.59 MIL/uL (ref 4.22–5.81)
RDW: 14.8 % (ref 11.5–15.5)
WBC: 9.7 10*3/uL (ref 4.0–10.5)
nRBC: 0 % (ref 0.0–0.2)

## 2019-02-05 LAB — FERRITIN: Ferritin: 32 ng/mL (ref 24–336)

## 2019-02-05 LAB — LACTATE DEHYDROGENASE: LDH: 128 U/L (ref 98–192)

## 2019-02-12 ENCOUNTER — Ambulatory Visit (HOSPITAL_COMMUNITY): Payer: 59 | Admitting: Hematology

## 2019-02-12 ENCOUNTER — Other Ambulatory Visit: Payer: Self-pay | Admitting: Family Medicine

## 2019-02-15 ENCOUNTER — Ambulatory Visit (HOSPITAL_COMMUNITY): Payer: Self-pay | Admitting: Hematology

## 2019-03-08 ENCOUNTER — Other Ambulatory Visit: Payer: Self-pay

## 2019-03-08 ENCOUNTER — Inpatient Hospital Stay (HOSPITAL_COMMUNITY): Payer: BLUE CROSS/BLUE SHIELD | Attending: Hematology | Admitting: Hematology

## 2019-03-08 ENCOUNTER — Encounter (HOSPITAL_COMMUNITY): Payer: Self-pay | Admitting: Hematology

## 2019-03-08 VITALS — BP 120/73 | HR 66 | Temp 98.2°F | Resp 18 | Wt 187.6 lb

## 2019-03-08 DIAGNOSIS — D509 Iron deficiency anemia, unspecified: Secondary | ICD-10-CM | POA: Insufficient documentation

## 2019-03-08 DIAGNOSIS — Z8261 Family history of arthritis: Secondary | ICD-10-CM | POA: Insufficient documentation

## 2019-03-08 DIAGNOSIS — F1111 Opioid abuse, in remission: Secondary | ICD-10-CM | POA: Diagnosis not present

## 2019-03-08 DIAGNOSIS — I252 Old myocardial infarction: Secondary | ICD-10-CM | POA: Insufficient documentation

## 2019-03-08 DIAGNOSIS — Z79899 Other long term (current) drug therapy: Secondary | ICD-10-CM | POA: Insufficient documentation

## 2019-03-08 DIAGNOSIS — K648 Other hemorrhoids: Secondary | ICD-10-CM | POA: Diagnosis not present

## 2019-03-08 DIAGNOSIS — Z7902 Long term (current) use of antithrombotics/antiplatelets: Secondary | ICD-10-CM | POA: Insufficient documentation

## 2019-03-08 DIAGNOSIS — F1411 Cocaine abuse, in remission: Secondary | ICD-10-CM | POA: Diagnosis not present

## 2019-03-08 DIAGNOSIS — M720 Palmar fascial fibromatosis [Dupuytren]: Secondary | ICD-10-CM | POA: Insufficient documentation

## 2019-03-08 DIAGNOSIS — F17210 Nicotine dependence, cigarettes, uncomplicated: Secondary | ICD-10-CM | POA: Insufficient documentation

## 2019-03-08 DIAGNOSIS — Z8249 Family history of ischemic heart disease and other diseases of the circulatory system: Secondary | ICD-10-CM | POA: Insufficient documentation

## 2019-03-08 DIAGNOSIS — R200 Anesthesia of skin: Secondary | ICD-10-CM | POA: Diagnosis not present

## 2019-03-08 DIAGNOSIS — D508 Other iron deficiency anemias: Secondary | ICD-10-CM

## 2019-03-08 DIAGNOSIS — Z836 Family history of other diseases of the respiratory system: Secondary | ICD-10-CM | POA: Diagnosis not present

## 2019-03-08 DIAGNOSIS — Z809 Family history of malignant neoplasm, unspecified: Secondary | ICD-10-CM | POA: Diagnosis not present

## 2019-03-08 NOTE — Patient Instructions (Addendum)
[facility] Discharge Instructions  You were seen today by Dr. Katragadda. He went over your recent lab results. He will see you back in 4 months for labs and follow up.   Thank you for choosing [facility] to provide your oncology and hematology care.  To afford each patient quality time with our provider, please arrive at least 15 minutes before your scheduled appointment time.   If you have a lab appointment with the Cancer Center please come in thru the  Main Entrance and check in at the main information desk  You need to re-schedule your appointment should you arrive 10 or more minutes late.  We strive to give you quality time with our providers, and arriving late affects you and other patients whose appointments are after yours.  Also, if you no show three or more times for appointments you may be dismissed from the clinic at the providers discretion.     Again, thank you for choosing [facility].  Our hope is that these requests will decrease the amount of time that you wait before being seen by our physicians.       _____________________________________________________________  Should you have questions after your visit to [facility], please contact our office at [phone] between the hours of 8:00 a.m. and 4:30 p.m.  Voicemails left after 4:00 p.m. will not be returned until the following business day.  For prescription refill requests, have your pharmacy contact our office and allow 72 hours.    Cancer Center Support Programs:   > Cancer Support Group  2nd Tuesday of the month 1pm-2pm, Journey Room    

## 2019-03-08 NOTE — Assessment & Plan Note (Addendum)
1.  Iron deficiency anemia: - Colonoscopy on 03/06/2015 in [place] shows nonbleeding internal hemorrhoids.  Otherwise normal exam. -Last Feraheme on 10/05/2018 and 10/12/2017. - He is tolerating iron tablet twice daily without any problems.  He is also on Brilinta and aspirin 81 mg for his heart disease. -Denies any bleeding per rectum or melena.  Stool for occult blood testing in October last year was within normal limits. -We reviewed his blood work.  Hemoglobin is 14.6 and ferritin was 32.  He was told to continue taking iron tablets at this time. - He does report some numbness in the first 3 fingers of the right hand.  He also has a Dupuytren's contracture. -I will see him back in 4 months with repeat blood work.  2.  Smoking history: -CT chest low-dose cancer screening on 10/31/2018 shows lung RADS 2S.  Low-dose CT scan was recommended in 12 months.

## 2019-03-08 NOTE — Progress Notes (Signed)
[facility] [address], [place] [address]   CLINIC:  Medical Oncology/Hematology  PCP:  Mechele Claude, MD [address] [place] [address] [phone]   REASON FOR VISIT:  Follow-up for Iron deficiency anemia     INTERVAL HISTORY:  Mr. Carriker 63 y.o. male returns for routine follow-up. He is here today alone. He states that he has been doing well since his last visit. Denies any nausea, vomiting, or diarrhea. Denies any new pains. Had not noticed any recent bleeding such as epistaxis, hematuria or hematochezia. Denies recent chest pain on exertion, shortness of breath on minimal exertion, pre-syncopal episodes, or palpitations. Denies any numbness or tingling in hands or feet. Denies any recent fevers, infections, or recent hospitalizations. Patient reports appetite at 100% and energy level at 75%.    REVIEW OF SYSTEMS:  Review of Systems  All other systems reviewed and are negative.    PAST MEDICAL/SURGICAL HISTORY:  Past Medical History:  Diagnosis Date  . Hepatitis C    Treated  . Iron deficiency anemia 10/02/2018  . Myocardial infarction [facility])    May 2019.  LAD moderate 60 to 80% stenosis.  Circumflex OM occluded.  RCA 80% stenosis.  OM was thought to be a acute infarct vessel.  It was treated with a Synergy stent.  This is a 2.5 x 20 mm, RCA was treated with a 3.  3.0 by 16 mm Synergy.  Elective PCI in June of an LAD lesion with 3.0 x 38 mm Synergy.  . 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 TONSILECTOMY, ADENOIDECTOMY, BILATERAL MYRINGOTOMY AND TUBES  1969     SOCIAL HISTORY:  Social History   Socioeconomic History  . Marital status: Married    Spouse name: Not on file  . Number of children: Not on file  . Years of education: Not on file  . Highest education level: Not on file  Occupational History  . Occupation: retired  Engineer, production  .  Financial resource strain: Not hard at all  . Food insecurity:    Worry: Never true    Inability: Never true  . Transportation needs:    Medical: No    Non-medical: No  Tobacco Use  . Smoking status: Current Every Day Smoker    Packs/day: 1.00    Years: 40.00    Pack years: 40.00    Types: Cigarettes  . Smokeless tobacco: Never Used  Substance and Sexual Activity  . Alcohol use: Yes    Comment: rarely  . Drug use: Not Currently    Types: Other-see comments    Comment: history of Heroin and Cocaine use in his teens   . Sexual activity: Yes  Lifestyle  . Physical activity:    Days per week: 7 days    Minutes per session: 60 min  . Stress: Only a little  Relationships  . Social connections:    Talks on phone: Three times a week    Gets together: Three times a week    Attends religious service: 1 to 4 times per year    Active member of club or organization: No    Attends meetings of clubs or organizations: Never    Relationship status: Married  . Intimate partner violence:    Fear of current or ex partner: No    Emotionally abused: No    Physically abused: No  Forced sexual activity: No  Other Topics Concern  . Not on file  Social History Narrative  . Not on file    FAMILY HISTORY:  Family History  Problem Relation Age of Onset  . Arthritis Mother   . CAD Mother 12  . Lung disease Father   . Cancer Brother     CURRENT MEDICATIONS:  Outpatient Encounter Medications as of 03/08/2019  Medication Sig  . acetaminophen (TYLENOL) 500 MG tablet Take 500 mg by mouth daily as needed for moderate pain.  . ASPIRIN ADULT LOW DOSE 81 MG EC tablet   . ASPIRIN LOW DOSE 81 MG EC tablet TAKE 1 TABLET BY MOUTH EVERY DAY  . atorvastatin (LIPITOR) 80 MG tablet Take 1 tablet (80 mg total) by mouth daily.  Marland Kitchen atorvastatin (LIPITOR) 80 MG tablet   . BRILINTA 90 MG TABS tablet   . clotrimazole-betamethasone (LOTRISONE) cream Apply 1 application topically 2 (two) times daily. To  affected areas until rash clears  . ferrous fumarate (HEMOCYTE - 106 MG FE) 325 (106 Fe) MG TABS tablet Take 1 tablet (106 mg of iron total) by mouth 2 (two) times daily.  Marland Kitchen lisinopril (PRINIVIL,ZESTRIL) 5 MG tablet Take 1 tablet (5 mg total) by mouth daily.  . metoprolol tartrate (LOPRESSOR) 25 MG tablet Take 1 tablet (25 mg total) by mouth 2 (two) times daily.  . nitroGLYCERIN (NITROSTAT) 0.4 MG SL tablet Place 1 tablet (0.4 mg total) under the tongue every 5 (five) minutes as needed for chest pain.  . ticagrelor (BRILINTA) 90 MG TABS tablet Take 1 tablet (90 mg total) by mouth 2 (two) times daily.  . [DISCONTINUED] lisinopril (ZESTRIL) 5 MG tablet   . varenicline (CHANTIX CONTINUING MONTH PAK) 1 MG tablet Take 1 tablet (1 mg total) by mouth 2 (two) times daily. (Patient taking differently: Take 1 mg by mouth 2 (two) times daily. Has not started taking)  . [DISCONTINUED] metoprolol tartrate (LOPRESSOR) 25 MG tablet   . [DISCONTINUED] varenicline (CHANTIX STARTING MONTH PAK) 0.5 MG X 11 & 1 MG X 42 tablet Use according to package directions   No facility-administered encounter medications on file as of 03/08/2019.     ALLERGIES:  No Known Allergies   PHYSICAL EXAM:  ECOG Performance status: 1  Vitals:   03/08/19 0809  BP: 120/73  Pulse: 66  Resp: 18  Temp: 98.2 F (36.8 C)  SpO2: 100%   Filed Weights   03/08/19 0809  Weight: 187 lb 9.6 oz (85.1 kg)    Physical Exam Vitals signs reviewed.  Constitutional:      Appearance: Normal appearance.  Cardiovascular:     Rate and Rhythm: Normal rate and regular rhythm.     Heart sounds: Normal heart sounds.  Pulmonary:     Effort: Pulmonary effort is normal.     Breath sounds: Normal breath sounds.  Abdominal:     General: There is no distension.     Palpations: Abdomen is soft. There is no mass.  Musculoskeletal:        General: No swelling.  Skin:    General: Skin is warm.  Neurological:     General: No focal deficit  present.     Mental Status: He is alert and oriented to person, place, and time.  Psychiatric:        Mood and Affect: Mood normal.        Behavior: Behavior normal.      LABORATORY DATA:  I have reviewed the labs as  listed.  CBC    Component Value Date/Time   WBC 9.7 02/05/2019 0852   RBC 4.59 02/05/2019 0852   HGB 14.6 02/05/2019 0852   HGB 13.6 12/25/2018 1406   HCT 45.6 02/05/2019 0852   HCT 40.0 12/25/2018 1406   PLT 281 02/05/2019 0852   PLT 283 12/25/2018 1406   MCV 99.3 02/05/2019 0852   MCV 93 12/25/2018 1406   MCH 31.8 02/05/2019 0852   MCHC 32.0 02/05/2019 0852   RDW 14.8 02/05/2019 0852   RDW 16.2 (H) 12/25/2018 1406   LYMPHSABS 1.9 02/05/2019 0852   LYMPHSABS 2.0 12/25/2018 1406   MONOABS 0.9 02/05/2019 0852   EOSABS 0.6 (H) 02/05/2019 0852   EOSABS 0.5 (H) 12/25/2018 1406   BASOSABS 0.1 02/05/2019 0852   BASOSABS 0.1 12/25/2018 1406   CMP Latest Ref Rng & Units 02/05/2019 12/25/2018 12/10/2018  Glucose 70 - 99 mg/dL 97 99 960(A103(H)  BUN 6 - 20 mg/dL 18 11 11   Creatinine 0.61 - 1.24 mg/dL 5.400.80 9.810.83 1.910.86  Sodium 135 - 145 mmol/L 142 143 140  Potassium 3.5 - 5.1 mmol/L 4.7 4.6 4.5  Chloride 98 - 111 mmol/L 111 108(H) 109(H)  CO2 22 - 32 mmol/L 24 20 18(L)  Calcium 8.9 - 10.3 mg/dL 9.4 9.3 9.1  Total Protein 6.5 - 8.1 g/dL 7.4 - 6.2  Total Bilirubin 0.3 - 1.2 mg/dL 0.5 - <4.7<0.2  Alkaline Phos 38 - 126 U/L 57 - 61  AST 15 - 41 U/L 18 - 10  ALT 0 - 44 U/L 21 - 12       DIAGNOSTIC IMAGING:  I have independently reviewed the scans and discussed with the patient.   I have reviewed Willy EddyAshley Travis LPN's note and agree with the documentation.  I personally performed a face-to-face visit, made revisions and my assessment and plan is as follows.    ASSESSMENT & PLAN:   Iron deficiency anemia 1.  Iron deficiency anemia: - [place] nonbleeding internal hemorrhoids.  Otherwise normal exam. -Last Feraheme on 10/05/2018 and  10/12/2017. - He is tolerating iron tablet twice daily without any problems.  He is also on Brilinta and aspirin 81 mg for his heart disease. -Denies any bleeding per rectum or melena.  Stool for occult blood testing in October last year was within normal limits. -We reviewed his blood work.  Hemoglobin is 14.6 and ferritin was 32.  He was told to continue taking iron tablets at this time. - He does report some numbness in the first 3 fingers of the right hand.  He also has a Dupuytren's contracture. -I will see him back in 4 months with repeat blood work.  2.  Smoking history: -CT chest low-dose cancer screening on 10/31/2018 shows lung RADS 2S.  Low-dose CT scan was recommended in 12 months.      Orders placed this encounter:  Orders Placed This Encounter  Procedures  . CBC with Differential/Platelet  . Comprehensive metabolic panel  . Iron and TIBC  . Ferritin      Doreatha MassedSreedhar Katragadda, MD [facility] [phone]

## 2019-04-25 ENCOUNTER — Telehealth: Payer: Self-pay | Admitting: *Deleted

## 2019-04-25 NOTE — Telephone Encounter (Signed)
Call placed to pt re: appt 05/01/2019 with Dr. Antoine Poche at the [place] office. We need to switch pt over to virtual appt and get consent.  Left pt a message to call back

## 2019-04-26 ENCOUNTER — Telehealth: Payer: Self-pay | Admitting: *Deleted

## 2019-04-26 NOTE — Telephone Encounter (Signed)
Spoke with patient to discuss 05/01/19 appointment and converting it to a virtual visit. At first he was hesitant to do a virtual appointment but after further discussion he has agreed.   ..   Virtual Visit Pre-Appointment Phone Call  "(Name), I am calling you today to discuss your upcoming appointment. We are currently trying to limit exposure to the virus that causes COVID-19 by seeing patients at home rather than in the office."  1. "What is the BEST phone number to call the day of the visit?" - include this in appointment notes  2. Do you have or have access to (through a family member/friend) a smartphone with video capability that we can use for your visit?" a. If yes - list this number in appt notes as cell (if different from BEST phone #) and list the appointment type as a VIDEO visit in appointment notes b. If no - list the appointment type as a PHONE visit in appointment notes  3. Confirm consent - "In the setting of the current Covid19 crisis, you are scheduled for a (phone or video) visit with your provider on (date) at (time).  Just as we do with many in-office visits, in order for you to participate in this visit, we must obtain consent.  If you'd like, I can send this to your mychart (if signed up) or email for you to review.  Otherwise, I can obtain your verbal consent now.  All virtual visits are billed to your insurance company just like a normal visit would be.  By agreeing to a virtual visit, we'd like you to understand that the technology does not allow for your provider to perform an examination, and thus may limit your provider's ability to fully assess your condition. If your provider identifies any concerns that need to be evaluated in person, we will make arrangements to do so.  Finally, though the technology is pretty good, we cannot assure that it will always work on either your or our end, and in the setting of a video visit, we may have to convert it to a phone-only  visit.  In either situation, we cannot ensure that we have a secure connection.  Are you willing to proceed?" STAFF: Did the patient verbally acknowledge consent to telehealth visit? Document YES/NO here: YES  4. Advise patient to be prepared - "Two hours prior to your appointment, go ahead and check your blood pressure, pulse, oxygen saturation, and your weight (if you have the equipment to check those) and write them all down. When your visit starts, your provider will ask you for this information. If you have an Apple Watch or Kardia device, please plan to have heart rate information ready on the day of your appointment. Please have a pen and paper handy nearby the day of the visit as well."  5. Give patient instructions for MyChart download to smartphone OR Doximity/[email or web address] as below if video visit (depending on what platform provider is using)--Explained process to patient and he states his understanding  6. Inform patient they will receive a phone call 15 minutes prior to their appointment time (may be from unknown caller ID) so they should be prepared to answer--Pt aware    TELEPHONE CALL NOTE  Johnny BerkshireJohnny Jayme Foster has been deemed a candidate for a follow-up tele-health visit to limit community exposure during the Covid-19 pandemic. I spoke with the patient via phone to ensure availability of phone/video source, confirm preferred email & phone number, and discuss instructions and expectations.  I reminded Johnny Foster to be prepared with any vital sign and/or heart rhythm information that could potentially be obtained via home monitoring, at the time of his visit. I reminded Johnny Foster to expect a phone call prior to his visit.  Valrie Hart, CMA 04/26/2019 2:28 PM   INSTRUCTIONS FOR DOWNLOADING THE MYCHART APP TO SMARTPHONE  - The patient must first make sure to have activated MyChart and know their login information - If Apple, go to [employer] and type in MyChart in the search bar  and download the app. If Android, ask patient to go to [employer] and type in [place] in the search bar and download the app. The app is free but as with any other app downloads, their phone may require them to verify saved payment information or Apple/Android password.  - The patient will need to then log into the app with their MyChart username and password, and select [facility] as their healthcare provider to link the account. When it is time for your visit, go to the MyChart app, find appointments, and click Begin Video Visit. Be sure to Select Allow for your device to access the Microphone and Camera for your visit. You will then be connected, and your provider will be with you shortly.  **If they have any issues connecting, or need assistance please contact MyChart service desk [phone])**  **If using a computer, in order to ensure the best quality for their visit they will need to use either of the following Internet Browsers: [employer] or Google Chrome**   IF USING DOXIMITY or DOXY.ME - The patient will receive a link just prior to their visit by text. --Pt aware    FULL LENGTH CONSENT FOR TELE-HEALTH VISIT   I hereby voluntarily request, consent and authorize [facility] and its employed or contracted physicians, physician assistants, nurse practitioners or other licensed health care professionals (the Practitioner), to provide me with telemedicine health care services (the Services") as deemed necessary by the treating Practitioner. I acknowledge and consent to receive the Services by the Practitioner via telemedicine. I understand that the telemedicine visit will involve communicating with the Practitioner through live audiovisual communication technology and the disclosure of certain medical information by electronic transmission. I acknowledge that I have been given the opportunity to request an in-person assessment or other available alternative prior  to the telemedicine visit and am voluntarily participating in the telemedicine visit.  I understand that I have the right to withhold or withdraw my consent to the use of telemedicine in the course of my care at any time, without affecting my right to future care or treatment, and that the Practitioner or I may terminate the telemedicine visit at any time. I understand that I have the right to inspect all information obtained and/or recorded in the course of the telemedicine visit and may receive copies of available information for a reasonable fee.  I understand that some of the potential risks of receiving the Services via telemedicine include:   Delay or interruption in medical evaluation due to technological equipment failure or disruption;  Information transmitted may not be sufficient (e.g. poor resolution of images) to allow for appropriate medical decision making by the Practitioner; and/or   In rare instances, security protocols could fail, causing a breach of personal health information.  Furthermore, I acknowledge that it is my responsibility to provide information about my medical history, conditions and care that is complete and accurate to the best  of my ability. I acknowledge that Practitioner's advice, recommendations, and/or decision may be based on factors not within their control, such as incomplete or inaccurate data provided by me or distortions of diagnostic images or specimens that may result from electronic transmissions. I understand that the practice of medicine is not an exact science and that Practitioner makes no warranties or guarantees regarding treatment outcomes. I acknowledge that I will receive a copy of this consent concurrently upon execution via email to the email address I last provided but may also request a printed copy by calling the office of [facility].    I understand that my insurance will be billed for this visit.   I have read or had this consent read  to me.  I understand the contents of this consent, which adequately explains the benefits and risks of the Services being provided via telemedicine.   I have been provided ample opportunity to ask questions regarding this consent and the Services and have had my questions answered to my satisfaction.  I give my informed consent for the services to be provided through the use of telemedicine in my medical care  By participating in this telemedicine visit I agree to the above.

## 2019-04-26 NOTE — Telephone Encounter (Signed)
Patient would like to know where he can go for covid-19 testing. He denies symptoms but would like to be tested due to his health condition, his medical history and the meds that he is taking. I made him aware that I would send a message to follow up on this and that someone from the office would reach out to him with recommendations. Patient verbalized his understanding and appreciation. He can be reached at [phone]. Thanks, MI

## 2019-04-26 NOTE — Telephone Encounter (Signed)
This would need to come through his PCP.

## 2019-04-29 NOTE — Progress Notes (Signed)
Virtual Visit via Video Note   This visit type was conducted due to national recommendations for restrictions regarding the COVID-19 Pandemic (e.g. social distancing) in an effort to limit this patient's exposure and mitigate transmission in our community.  Due to his co-morbid illnesses, this patient is at least at moderate risk for complications without adequate follow up.  This format is felt to be most appropriate for this patient at this time.  All issues noted in this document were discussed and addressed.  A limited physical exam was performed with this format.  Please refer to the patient's chart for his consent to telehealth for [facility].   Date:  05/01/2019   ID:  Johnny Foster, DOB 1956/03/30, MRN [id number]  Patient Location: Home Provider Location: Home  PCP:  Mechele ClaudeStacks, Warren, MD  Cardiologist:  Rollene RotundaJames Terasa Orsini, MD  Electrophysiologist:  None   Evaluation Performed:  Follow-Up Visit  Chief Complaint:  CAD   History of Present Illness:    Johnny Foster is a 63 y.o. male who presents for follow up of CAD  He had a cardiac cath in June 2019 in [place] after presenting with a acute inferolateral MI.  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  EF was normal.      He had been quite anxious and had some stressors.  He says he gets hot in the sun and cannot do this anymore but he did before and so is going to be looking to go on [employer].  He still smoking some cigarettes but he cut back.The patient denies any new symptoms such as chest discomfort, neck or arm discomfort. There has been no new shortness of breath, PND or orthopnea. There have been no reported palpitations, presyncope or syncope.   The patient does not have symptoms concerning for COVID-19 infection (fever, chills, cough, or new shortness of breath).    Past Medical History:  Diagnosis Date  . Hepatitis C    Treated  . Iron  deficiency anemia 10/02/2018  . Myocardial infarction [facility](HCC)    May 2019.  LAD moderate 60 to 80% stenosis.  Circumflex OM occluded.  RCA 80% stenosis.  OM was thought to be a acute infarct vessel.  It was treated with a Synergy stent.  This is a 2.5 x 20 mm, RCA was treated with a 3.  3.0 by 16 mm Synergy.  Elective PCI in June of an LAD lesion with 3.0 x 38 mm Synergy.  . 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 TONSILECTOMY, ADENOIDECTOMY, BILATERAL MYRINGOTOMY AND TUBES  1969     Prior to Admission medications   Medication Sig Start Date End Date Taking? Authorizing Provider  acetaminophen (TYLENOL) 500 MG tablet Take 500 mg by mouth daily as needed for moderate pain.   Yes [provider]  ASPIRIN ADULT LOW DOSE 81 MG EC tablet Take 81 mg by mouth daily.  11/27/18  Yes [provider]  atorvastatin (LIPITOR) 80 MG tablet Take 1 tablet (80 mg total) by mouth daily. 12/10/18  Yes Stacks, Broadus JohnWarren, MD  BRILINTA 90 MG TABS tablet Take 90 mg by mouth 2 (two) times daily.  12/06/18  Yes [provider]  clotrimazole-betamethasone (LOTRISONE) cream Apply 1 application topically 2 (two) times daily. To affected areas until rash clears 12/10/18  Yes  Mechele ClaudeStacks, Warren, MD  lisinopril (PRINIVIL,ZESTRIL) 5 MG tablet Take 1 tablet (5 mg total) by mouth daily. 12/10/18  Yes Mechele ClaudeStacks, Warren, MD  metoprolol tartrate (LOPRESSOR) 25 MG tablet Take 1 tablet (25 mg total) by mouth 2 (two) times daily. 12/10/18  Yes Mechele ClaudeStacks, Warren, MD  nitroGLYCERIN (NITROSTAT) 0.4 MG SL tablet Place 1 tablet (0.4 mg total) under the tongue every 5 (five) minutes as needed for chest pain. 09/19/18  Yes Rollene RotundaHochrein, Cierah Crader, MD  varenicline (CHANTIX CONTINUING MONTH PAK) 1 MG tablet Take 1 tablet (1 mg total) by mouth 2 (two) times daily. Patient not taking: Reported on 05/01/2019 12/25/18   Mechele ClaudeStacks, Warren, MD     Allergies:   Patient has no  known allergies.   Social History   Tobacco Use  . Smoking status: Current Every Day Smoker    Packs/day: 1.00    Years: 40.00    Pack years: 40.00    Types: Cigarettes  . Smokeless tobacco: Never Used  Substance Use Topics  . Alcohol use: Yes    Comment: rarely  . Drug use: Not Currently    Types: Other-see comments    Comment: history of Heroin and Cocaine use in his teens      Family Hx: The patient's family history includes Arthritis in his mother; CAD (age of onset: 7777) in his mother; Cancer in his brother; Lung disease in his father.  ROS:   Please see the history of present illness.    As stated in the HPI and negative for all other systems.   Prior CV studies:   The following studies were reviewed today:    Labs/Other Tests and Data Reviewed:    EKG:  No ECG reviewed.  Recent Labs: 02/05/2019: ALT 21; BUN 18; Creatinine, Ser 0.80; Hemoglobin 14.6; Platelets 281; Potassium 4.7; Sodium 142   Recent Lipid Panel Lab Results  Component Value Date/Time   CHOL 110 12/10/2018 08:48 AM   TRIG 56 12/10/2018 08:48 AM   HDL 54 12/10/2018 08:48 AM   CHOLHDL 2.0 12/10/2018 08:48 AM   LDLCALC 45 12/10/2018 08:48 AM    Wt Readings from Last 3 Encounters:  05/01/19 191 lb (86.6 kg)  03/08/19 187 lb 9.6 oz (85.1 kg)  01/09/19 190 lb 3.2 oz (86.3 kg)     Objective:    Vital Signs:  BP 114/89   Pulse 64   Ht 5\' 10"  (1.778 m)   Wt 191 lb (86.6 kg)   BMI 27.41 kg/m    VITAL SIGNS:  reviewed GEN:  no acute distress EYES:  sclerae anicteric, EOMI - Extraocular Movements Intact NEURO:  alert and oriented x 3, no obvious focal deficit PSYCH:  normal affect  ASSESSMENT & PLAN:    CAD:    The patient has no new sypmtoms.  No further cardiovascular testing is indicated.  We will continue with aggressive risk reduction and meds as listed.  He will stop his Brilinta when the medicine runs out in 6 months.  TOBACCO ABUSE :   We had another long talk about this and  hopefully he can try the Chantix he has can eventually stop smoking.   DYSLIPIDEMIA:    LDL was 45 in January with an HDL of 54.  No change in therapy.   COVID-19 Education: The signs and symptoms of COVID-19 were discussed with the patient and how to seek care for testing (follow up with PCP or arrange E-visit).  The importance of social distancing was discussed today.  Time:   Today, I have spent 25 minutes with the patient with telehealth technology discussing the above problems.     Medication Adjustments/Labs and Tests Ordered: Current medicines are reviewed at length with the patient today.  Concerns regarding medicines are outlined above.   Tests Ordered: No orders of the defined types were placed in this encounter.   Medication Changes: No orders of the defined types were placed in this encounter.   Disposition:  Follow up with me in the office in [place] in six months  Signed, Minus Breeding, MD  05/01/2019 9:05 AM    [place]

## 2019-04-29 NOTE — Telephone Encounter (Signed)
LM with Dr. Rosezella [place] recommendation.

## 2019-05-01 ENCOUNTER — Telehealth (INDEPENDENT_AMBULATORY_CARE_PROVIDER_SITE_OTHER): Payer: BLUE CROSS/BLUE SHIELD | Admitting: Cardiology

## 2019-05-01 ENCOUNTER — Other Ambulatory Visit: Payer: Self-pay

## 2019-05-01 ENCOUNTER — Encounter: Payer: Self-pay | Admitting: Cardiology

## 2019-05-01 VITALS — BP 114/89 | HR 64 | Ht 70.0 in | Wt 191.0 lb

## 2019-05-01 DIAGNOSIS — I2510 Atherosclerotic heart disease of native coronary artery without angina pectoris: Secondary | ICD-10-CM

## 2019-05-01 DIAGNOSIS — E785 Hyperlipidemia, unspecified: Secondary | ICD-10-CM

## 2019-05-01 DIAGNOSIS — Z720 Tobacco use: Secondary | ICD-10-CM

## 2019-05-01 NOTE — Patient Instructions (Signed)
Medication Instructions:  The current medical regimen is effective;  continue present plan and medications.  If you need a refill on your cardiac medications before your next appointment, please call your pharmacy.   Follow-Up: Follow up in 6 months with Dr. Hochrein in [place].  You will receive a letter in the mail 2 months before you are due.  Please call us when you receive this letter to schedule your follow up appointment.  Thank you for choosing [facility]!!     

## 2019-06-04 ENCOUNTER — Other Ambulatory Visit: Payer: Self-pay

## 2019-06-05 ENCOUNTER — Ambulatory Visit: Payer: BLUE CROSS/BLUE SHIELD | Admitting: Family Medicine

## 2019-06-05 ENCOUNTER — Encounter: Payer: Self-pay | Admitting: Family Medicine

## 2019-06-05 VITALS — BP 126/81 | HR 59 | Temp 98.3°F | Ht 70.0 in | Wt 188.0 lb

## 2019-06-05 DIAGNOSIS — M25511 Pain in right shoulder: Secondary | ICD-10-CM

## 2019-06-05 DIAGNOSIS — E782 Mixed hyperlipidemia: Secondary | ICD-10-CM

## 2019-06-05 DIAGNOSIS — D508 Other iron deficiency anemias: Secondary | ICD-10-CM

## 2019-06-05 DIAGNOSIS — I2510 Atherosclerotic heart disease of native coronary artery without angina pectoris: Secondary | ICD-10-CM | POA: Diagnosis not present

## 2019-06-05 DIAGNOSIS — I10 Essential (primary) hypertension: Secondary | ICD-10-CM | POA: Diagnosis not present

## 2019-06-05 MED ORDER — METOPROLOL TARTRATE 25 MG PO TABS: 25.0000 mg | ORAL_TABLET | Freq: Two times a day (BID) | ORAL | 1 refills | Status: DC

## 2019-06-05 MED ORDER — LISINOPRIL 5 MG PO TABS: 5.0000 mg | ORAL_TABLET | Freq: Every day | ORAL | 1 refills | Status: DC

## 2019-06-05 MED ORDER — ATORVASTATIN CALCIUM 80 MG PO TABS: 80.0000 mg | ORAL_TABLET | Freq: Every day | ORAL | 1 refills | Status: DC

## 2019-06-05 NOTE — Progress Notes (Signed)
Subjective:  Patient ID: Johnny Foster, male    DOB: January 13, 1956  Age: 63 y.o. MRN: [id number]  CC: Medical Management of Chronic Issues   HPI Johnny Foster presents for  follow-up of hypertension. Patient has no history of headache chest pain or shortness of breath or recent cough. Patient also denies symptoms of TIA such as focal numbness or weakness. Patient denies side effects from medication. States taking it regularly.  Some chronic ache along posterior right shoulder. No relief with various OTC meds. Tried som naproxen without relief.  Getting treated for anemia with Hematology. Due for CBC, etc. Had colonoscopy with Dr. Constance Holster 6 years ago. Considering repeat since it was normal, but has anemia.   History Johnny Foster has a past medical history of Hepatitis C, Iron deficiency anemia (10/02/2018), Myocardial infarction [facility]), and Substance abuse ([place]).   Johnny Foster has a past surgical history that includes Tonsilectomy, adenoidectomy, bilateral myringotomy and tubes (1969).   His family history includes Arthritis in his mother; CAD (age of onset: 20) in his mother; Cancer in his brother; Lung disease in his father.Johnny Foster reports that Johnny Foster has been smoking cigarettes. Johnny Foster has a 40.00 pack-year smoking history. Johnny Foster has never used smokeless tobacco. Johnny Foster reports current alcohol use. Johnny Foster reports previous drug use. Drug: Other-see comments.  Current Outpatient Medications on File Prior to Visit  Medication Sig Dispense Refill  . acetaminophen (TYLENOL) 500 MG tablet Take 500 mg by mouth daily as needed for moderate pain.    . ASPIRIN ADULT LOW DOSE 81 MG EC tablet Take 81 mg by mouth daily.     Marland Kitchen BRILINTA 90 MG TABS tablet Take 90 mg by mouth 2 (two) times daily.     . clotrimazole-betamethasone (LOTRISONE) cream Apply 1 application topically 2 (two) times daily. To affected areas until rash clears 45 g 1  . nitroGLYCERIN (NITROSTAT) 0.4 MG SL tablet Place 1 tablet (0.4 mg total) under the tongue every 5 (five)  minutes as needed for chest pain. 25 tablet prn   No current facility-administered medications on file prior to visit.     ROS Review of Systems  Constitutional: Negative.   HENT: Negative.   Eyes: Negative for visual disturbance.  Respiratory: Negative for cough and shortness of breath.   Cardiovascular: Negative for chest pain and leg swelling.  Gastrointestinal: Negative for abdominal pain, diarrhea, nausea and vomiting.  Genitourinary: Negative for difficulty urinating.  Musculoskeletal: Positive for arthralgias (right shoulder) and myalgias (right shoulder).  Skin: Negative for rash.  Neurological: Negative for headaches.  Psychiatric/Behavioral: Negative for sleep disturbance.    Objective:  BP 126/81   Pulse (!) 59   Temp 98.3 F (36.8 C) (Oral)   Ht 5' 10"  (1.778 m)   Wt 188 lb (85.3 kg)   BMI 26.98 kg/m   BP Readings from Last 3 Encounters:  06/05/19 126/81  05/01/19 114/89  03/08/19 120/73    Wt Readings from Last 3 Encounters:  06/05/19 188 lb (85.3 kg)  05/01/19 191 lb (86.6 kg)  03/08/19 187 lb 9.6 oz (85.1 kg)     Physical Exam Constitutional:      General: Johnny Foster is not in acute distress.    Appearance: Johnny Foster is well-developed.  HENT:     Head: Normocephalic and atraumatic.     Right Ear: External ear normal.     Left Ear: External ear normal.     Nose: Nose normal.  Eyes:     Conjunctiva/sclera: Conjunctivae normal.     Pupils:  Pupils are equal, round, and reactive to light.  Neck:     Musculoskeletal: Normal range of motion and neck supple.  Cardiovascular:     Rate and Rhythm: Normal rate and regular rhythm.     Heart sounds: Normal heart sounds. No murmur.  Pulmonary:     Effort: Pulmonary effort is normal. No respiratory distress.     Breath sounds: Normal breath sounds. No wheezing or rales.  Abdominal:     Palpations: Abdomen is soft.     Tenderness: There is no abdominal tenderness.  Musculoskeletal: Normal range of motion.         General: Tenderness (supraspinatus region of right posterior shoulder.) present.  Skin:    General: Skin is warm and dry.  Neurological:     Mental Status: Johnny Foster is alert and oriented to person, place, and time.     Deep Tendon Reflexes: Reflexes are normal and symmetric.  Psychiatric:        Behavior: Behavior normal.        Thought Content: Thought content normal.        Judgment: Judgment normal.       Assessment & Plan:   Johnny Foster was seen today for medical management of chronic issues.  Diagnoses and all orders for this visit:  Other iron deficiency anemia -     Anemia Profile B  ASCVD (arteriosclerotic cardiovascular disease) -     CMP14+EGFR  Essential hypertension -     CMP14+EGFR  Mixed hyperlipidemia -     CMP14+EGFR  Acute pain of right shoulder -     Ambulatory referral to Physical Therapy  Other orders -     metoprolol tartrate (LOPRESSOR) 25 MG tablet; Take 1 tablet (25 mg total) by mouth 2 (two) times daily. -     lisinopril (ZESTRIL) 5 MG tablet; Take 1 tablet (5 mg total) by mouth daily. -     atorvastatin (LIPITOR) 80 MG tablet; Take 1 tablet (80 mg total) by mouth daily.   Allergies as of 06/05/2019   No Known Allergies     Medication List       Accurate as of June 05, 2019 11:01 AM. If you have any questions, ask your nurse or doctor.        STOP taking these medications   varenicline 1 MG tablet Commonly known as: Chantix Continuing Month Pak Stopped by: Claretta Fraise, MD     TAKE these medications   acetaminophen 500 MG tablet Commonly known as: TYLENOL Take 500 mg by mouth daily as needed for moderate pain.   Aspirin Adult Low Dose 81 MG EC tablet Generic drug: aspirin Take 81 mg by mouth daily.   atorvastatin 80 MG tablet Commonly known as: LIPITOR Take 1 tablet (80 mg total) by mouth daily.   Brilinta 90 MG Tabs tablet Generic drug: ticagrelor Take 90 mg by mouth 2 (two) times daily.   clotrimazole-betamethasone cream  Commonly known as: Lotrisone Apply 1 application topically 2 (two) times daily. To affected areas until rash clears   lisinopril 5 MG tablet Commonly known as: ZESTRIL Take 1 tablet (5 mg total) by mouth daily.   metoprolol tartrate 25 MG tablet Commonly known as: LOPRESSOR Take 1 tablet (25 mg total) by mouth 2 (two) times daily.   nitroGLYCERIN 0.4 MG SL tablet Commonly known as: NITROSTAT Place 1 tablet (0.4 mg total) under the tongue every 5 (five) minutes as needed for chest pain.       Meds ordered  this encounter  Medications  . metoprolol tartrate (LOPRESSOR) 25 MG tablet    Sig: Take 1 tablet (25 mg total) by mouth 2 (two) times daily.    Dispense:  180 tablet    Refill:  1  . lisinopril (ZESTRIL) 5 MG tablet    Sig: Take 1 tablet (5 mg total) by mouth daily.    Dispense:  90 tablet    Refill:  1  . atorvastatin (LIPITOR) 80 MG tablet    Sig: Take 1 tablet (80 mg total) by mouth daily.    Dispense:  90 tablet    Refill:  1      Follow-up: No follow-ups on file.  Claretta Fraise, M.D.

## 2019-06-06 ENCOUNTER — Other Ambulatory Visit: Payer: Self-pay | Admitting: Family Medicine

## 2019-06-06 ENCOUNTER — Other Ambulatory Visit: Payer: Self-pay | Admitting: *Deleted

## 2019-06-06 LAB — CMP14+EGFR
ALT: 14 IU/L (ref 0–44)
AST: 14 IU/L (ref 0–40)
Albumin/Globulin Ratio: 2.2 (ref 1.2–2.2)
Albumin: 4.7 g/dL (ref 3.8–4.8)
Alkaline Phosphatase: 59 IU/L (ref 39–117)
BUN/Creatinine Ratio: 17 (ref 10–24)
BUN: 15 mg/dL (ref 8–27)
Bilirubin Total: 0.3 mg/dL (ref 0.0–1.2)
CO2: 21 mmol/L (ref 20–29)
Calcium: 9.4 mg/dL (ref 8.6–10.2)
Chloride: 110 mmol/L — ABNORMAL HIGH (ref 96–106)
Creatinine, Ser: 0.88 mg/dL (ref 0.76–1.27)
GFR calc Af Amer: 107 mL/min/{1.73_m2} (ref >59)
GFR calc non Af Amer: 93 mL/min/{1.73_m2} (ref >59)
Globulin, Total: 2.1 g/dL (ref 1.5–4.5)
Glucose: 91 mg/dL (ref 65–99)
Potassium: 5.1 mmol/L (ref 3.5–5.2)
Sodium: 144 mmol/L (ref 134–144)
Total Protein: 6.8 g/dL (ref 6.0–8.5)

## 2019-06-06 LAB — ANEMIA PROFILE B
Basophils Absolute: 0.1 10*3/uL (ref 0.0–0.2)
Basos: 1 %
EOS (ABSOLUTE): 0.5 10*3/uL — ABNORMAL HIGH (ref 0.0–0.4)
Eos: 5 %
Ferritin: 16 ng/mL — ABNORMAL LOW (ref 30–400)
Folate: 9.4 ng/mL (ref >3.0)
Hematocrit: 41.6 % (ref 37.5–51.0)
Hemoglobin: 13.9 g/dL (ref 13.0–17.7)
Immature Grans (Abs): 0 10*3/uL (ref 0.0–0.1)
Immature Granulocytes: 0 %
Iron Saturation: 14 % — ABNORMAL LOW (ref 15–55)
Iron: 54 ug/dL (ref 38–169)
Lymphocytes Absolute: 1.9 10*3/uL (ref 0.7–3.1)
Lymphs: 18 %
MCH: 31.7 pg (ref 26.6–33.0)
MCHC: 33.4 g/dL (ref 31.5–35.7)
MCV: 95 fL (ref 79–97)
Monocytes Absolute: 1 10*3/uL — ABNORMAL HIGH (ref 0.1–0.9)
Monocytes: 10 %
Neutrophils Absolute: 6.9 10*3/uL (ref 1.4–7.0)
Neutrophils: 66 %
Platelets: 261 10*3/uL (ref 150–450)
RBC: 4.39 x10E6/uL (ref 4.14–5.80)
RDW: 12.7 % (ref 11.6–15.4)
Retic Ct Pct: 2.1 % (ref 0.6–2.6)
Total Iron Binding Capacity: 390 ug/dL (ref 250–450)
UIBC: 336 ug/dL (ref 111–343)
Vitamin B-12: 265 pg/mL (ref 232–1245)
WBC: 10.3 10*3/uL (ref 3.4–10.8)

## 2019-06-06 MED ORDER — FERROUS SULFATE 324 MG PO TBEC: 324.0000 mg | DELAYED_RELEASE_TABLET | Freq: Two times a day (BID) | ORAL | 6 refills | Status: DC

## 2019-06-06 MED ORDER — FERROUS FUMARATE 324 (106 FE) MG PO TABS: 1.0000 | ORAL_TABLET | Freq: Two times a day (BID) | ORAL | 5 refills | Status: DC

## 2019-06-10 ENCOUNTER — Ambulatory Visit: Payer: BLUE CROSS/BLUE SHIELD | Admitting: Family Medicine

## 2019-06-11 ENCOUNTER — Encounter: Payer: Self-pay | Admitting: Physical Therapy

## 2019-06-11 ENCOUNTER — Other Ambulatory Visit: Payer: Self-pay

## 2019-06-11 ENCOUNTER — Ambulatory Visit: Payer: BLUE CROSS/BLUE SHIELD | Attending: Family Medicine | Admitting: Physical Therapy

## 2019-06-11 DIAGNOSIS — M6281 Muscle weakness (generalized): Secondary | ICD-10-CM | POA: Diagnosis present

## 2019-06-11 DIAGNOSIS — G8929 Other chronic pain: Secondary | ICD-10-CM | POA: Insufficient documentation

## 2019-06-11 DIAGNOSIS — M25511 Pain in right shoulder: Secondary | ICD-10-CM | POA: Diagnosis not present

## 2019-06-11 NOTE — Therapy (Signed)
[facility] [address] [place], [place], [address] Phone: [phone]   Fax:  [phone]  Physical Therapy Evaluation  Patient Details  Name: Johnny Foster MRN: [id number] Date of Birth: 08/04/1956 Referring Provider (PT): Mechele ClaudeWarren Stacks, MD   Encounter Date: 06/11/2019  PT End of Session - 06/11/19 1043    Visit Number  1    Number of Visits  12    Date for PT Re-Evaluation  07/23/19    Authorization Type  Progress note every 10th visit;    PT Start Time  0815    PT Stop Time  0900    PT Time Calculation (min)  45 min    Activity Tolerance  Patient tolerated treatment well    Behavior During Therapy  [facility] for tasks assessed/performed       Past Medical History:  Diagnosis Date  . Hepatitis C    Treated  . Iron deficiency anemia 10/02/2018  . Myocardial infarction [facility](HCC)    May 2019.  LAD moderate 60 to 80% stenosis.  Circumflex OM occluded.  RCA 80% stenosis.  OM was thought to be a acute infarct vessel.  It was treated with a Synergy stent.  This is a 2.5 x 20 mm, RCA was treated with a 3.  3.0 by 16 mm Synergy.  Elective PCI in June of an LAD lesion with 3.0 x 38 mm Synergy.  . 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 TONSILECTOMY, ADENOIDECTOMY, BILATERAL MYRINGOTOMY AND TUBES  1969    There were no vitals filed for this visit.   Subjective Assessment - 06/11/19 1804    Subjective  COVID-19 screening performed upon arrival.Patient arrives to physical therapy with intermittent right shoulder pain that began over 4 years ago. Patient reports repetitive motions at work may have been a cause for the pain. Patient has had chiropractic care and pain management over the years, but pain persists intermittently. Patient reports pain with long distance driving, with sleeping, and sitting for long period of time. Patient reports 7-8/10 pain with pain as  sore, tight, and tingling. Patient reports pain at best 0/10. Patient's goals are to decrease pain, improve mobility and improve strength.    Pertinent History  HTN, history of MI    Limitations  Sitting;Lifting;House hold activities    Diagnostic tests  x-ray    Patient Stated Goals  decrease pain, improve movement and improve strength    Currently in Pain?  No/denies    Aggravating Factors   certain movements    Pain Relieving Factors  extra strength tylenol    Effect of Pain on Daily Activities  "depends" "wakes me up at night"         [facility] PT Assessment - 06/11/19 0001      Assessment   Medical Diagnosis  Acute pain of right shoulder    Referring Provider (PT)  Mechele ClaudeWarren Stacks, MD    Onset Date/Surgical Date  --   ongoing since 2016   Hand Dominance  Right    Next MD Visit  "in four months"    Prior Therapy  no      Precautions   Precautions  None      Balance Screen   Has the patient fallen in the past 6 months  No    Has the patient had a decrease in activity level because of a fear of falling?  No    Is the patient reluctant to leave their home because of a fear of falling?   No      Home Public house managernvironment   Living Environment  Private residence    Living Arrangements  Spouse/significant other;Children      Prior Function   Level of Independence  Independent    Vocation  Retired      Metallurgistosture/Postural Control   Posture/Postural Control  Postural limitations    Postural Limitations  Rounded Shoulders;Forward head;Posterior pelvic tilt   increased fwd head and rounded shoulder in sitting     ROM / Strength   AROM / PROM / Strength  AROM;Strength      AROM   Overall AROM   Within functional limits for tasks performed    Overall AROM Comments  Shoulder AROM WFL    AROM Assessment Site  Shoulder      Strength   Strength Assessment Site  Shoulder    Right/Left Shoulder  Right    Right Shoulder Flexion  4/5    Right Shoulder ABduction  4/5    Right Shoulder Internal  Rotation  4+/5    Right Shoulder External Rotation  4+/5      Palpation   Palpation comment  tenderness to palpation to right UT and levator scap      Special Tests    Special Tests  Rotator Cuff Impingement    Rotator Cuff Impingment tests  Leanord AsalHawkins- Kennedy test;Speed's test      Hawkins-Kennedy test   Findings  Negative      Speed's test   Findings  Negative                Objective measurements completed on examination: See above findings.              PT Education - 06/11/19 1811    Education Details  scapular retraction, UT stretch, levator scap stretch    Person(s) Educated  Patient    Methods  Explanation;Demonstration;Handout    Comprehension  Verbalized understanding          PT Long Term Goals - 06/11/19 1815      PT LONG TERM GOAL #1   Title  Patient will be independent with HEP    Time  6    Period  Weeks    Status  New      PT LONG TERM GOAL #2   Title  Patient will demonstrate 5/5 right shoulder MMT to improve stability during functional tasks.    Time  6    Period  Weeks    Status  New      PT LONG TERM GOAL #3   Title  Patient will report ability to perform functional tasks with right shoulder pain less than 2/10    Time  6    Period  Weeks    Status  New      PT LONG TERM GOAL #4   Title  Patient will report ability to drive long distances 2+ hours with right shoulder pain less than or equal to 2/10.    Time  6    Period  Weeks    Status  New             Plan - 06/11/19 1811    Clinical Impression Statement  Patient is a 63 year old right handed male who presents to physical therapy with right shoulder pain that began about 4 years ago. Patient noted with WNL  right AROM and with decreased right shoulder MMT (+) pain with flexion and abduction MMT. Patient (-) with right Hawkin's-Kennedy Test and Speed's test. Patient noted with slight tenderness to palpation to right UT and levator scapular area. Tone in UT and  levator equal bilaterally. Patient noted with slight forward head and rounded shoulders in standing but with increased rounded shoulders and forward head when in sitting. Patient would benefit from skilled physical therapy to address deficits and address patient's goals.    Personal Factors and Comorbidities  Age;Comorbidity 1    Comorbidities  HTN    Examination-Activity Limitations  Reach Overhead;Sit    Examination-Participation Restrictions  Driving    Stability/Clinical Decision Making  Stable/Uncomplicated    Clinical Decision Making  Low    Rehab Potential  Good    PT Frequency  2x / week    PT Duration  4 weeks    PT Next Visit Plan  UBE, postural exercises, UT stretch, levator scap stretch, STW/M and myofascial release to right UT. Modalities PRN for pain relief.    PT Home Exercise Plan  see patient education section    Consulted and Agree with Plan of Care  Patient       Patient will benefit from skilled therapeutic intervention in order to improve the following deficits and impairments:  Postural dysfunction, Pain, Decreased activity tolerance, Decreased strength  Visit Diagnosis: 1. Chronic right shoulder pain   2. Muscle weakness (generalized)        Problem List Patient Active Problem List   Diagnosis Date Noted  . Absolute anemia 10/11/2018  . Iron deficiency anemia 10/02/2018  . Encounter for long-term (current) use of medications 09/19/2018  . Tobacco abuse 09/19/2018  . ASCVD (arteriosclerotic cardiovascular disease) 09/09/2018  . Essential hypertension 09/09/2018  . Mixed hyperlipidemia 09/09/2018   Gabriela Eves, PT, DPT 06/11/2019, 6:16 PM  [facility] [address] [place], [place], [address] Phone: [phone]   Fax:  [phone]  Name: Johnny Foster MRN: [id number] Date of Birth: 25-Dec-1955

## 2019-07-08 ENCOUNTER — Other Ambulatory Visit: Payer: Self-pay

## 2019-07-08 ENCOUNTER — Inpatient Hospital Stay (HOSPITAL_COMMUNITY): Payer: BLUE CROSS/BLUE SHIELD | Attending: Hematology

## 2019-07-08 DIAGNOSIS — D508 Other iron deficiency anemias: Secondary | ICD-10-CM | POA: Diagnosis present

## 2019-07-08 LAB — COMPREHENSIVE METABOLIC PANEL
ALT: 19 U/L (ref 0–44)
AST: 18 U/L (ref 15–41)
Albumin: 4.3 g/dL (ref 3.5–5.0)
Alkaline Phosphatase: 58 U/L (ref 38–126)
Anion gap: 7 (ref 5–15)
BUN: 15 mg/dL (ref 8–23)
CO2: 24 mmol/L (ref 22–32)
Calcium: 9.1 mg/dL (ref 8.9–10.3)
Chloride: 108 mmol/L (ref 98–111)
Creatinine, Ser: 0.86 mg/dL (ref 0.61–1.24)
GFR calc Af Amer: >60 mL/min (ref >60)
GFR calc non Af Amer: >60 mL/min (ref >60)
Glucose, Bld: 122 mg/dL — ABNORMAL HIGH (ref 70–99)
Potassium: 4.8 mmol/L (ref 3.5–5.1)
Sodium: 139 mmol/L (ref 135–145)
Total Bilirubin: 0.7 mg/dL (ref 0.3–1.2)
Total Protein: 7.4 g/dL (ref 6.5–8.1)

## 2019-07-08 LAB — CBC WITH DIFFERENTIAL/PLATELET
Abs Immature Granulocytes: 0.04 10*3/uL (ref 0.00–0.07)
Basophils Absolute: 0.1 10*3/uL (ref 0.0–0.1)
Basophils Relative: 1 %
Eosinophils Absolute: 0.6 10*3/uL — ABNORMAL HIGH (ref 0.0–0.5)
Eosinophils Relative: 5 %
HCT: 46.7 % (ref 39.0–52.0)
Hemoglobin: 15 g/dL (ref 13.0–17.0)
Immature Granulocytes: 0 %
Lymphocytes Relative: 20 %
Lymphs Abs: 2.2 10*3/uL (ref 0.7–4.0)
MCH: 30.9 pg (ref 26.0–34.0)
MCHC: 32.1 g/dL (ref 30.0–36.0)
MCV: 96.1 fL (ref 80.0–100.0)
Monocytes Absolute: 0.8 10*3/uL (ref 0.1–1.0)
Monocytes Relative: 8 %
Neutro Abs: 7.2 10*3/uL (ref 1.7–7.7)
Neutrophils Relative %: 66 %
Platelets: 268 10*3/uL (ref 150–400)
RBC: 4.86 MIL/uL (ref 4.22–5.81)
RDW: 14.5 % (ref 11.5–15.5)
WBC: 10.9 10*3/uL — ABNORMAL HIGH (ref 4.0–10.5)
nRBC: 0 % (ref 0.0–0.2)

## 2019-07-08 LAB — IRON AND TIBC
Iron: 44 ug/dL — ABNORMAL LOW (ref 45–182)
Saturation Ratios: 9 % — ABNORMAL LOW (ref 17.9–39.5)
TIBC: 481 ug/dL — ABNORMAL HIGH (ref 250–450)
UIBC: 437 ug/dL

## 2019-07-08 LAB — FERRITIN: Ferritin: 10 ng/mL — ABNORMAL LOW (ref 24–336)

## 2019-07-15 ENCOUNTER — Ambulatory Visit (HOSPITAL_COMMUNITY): Payer: BLUE CROSS/BLUE SHIELD | Admitting: Hematology

## 2019-08-02 ENCOUNTER — Other Ambulatory Visit: Payer: Self-pay

## 2019-08-02 ENCOUNTER — Ambulatory Visit (INDEPENDENT_AMBULATORY_CARE_PROVIDER_SITE_OTHER): Payer: BLUE CROSS/BLUE SHIELD

## 2019-08-02 DIAGNOSIS — Z23 Encounter for immunization: Secondary | ICD-10-CM

## 2019-09-02 ENCOUNTER — Other Ambulatory Visit: Payer: Self-pay | Admitting: Family Medicine

## 2019-10-03 ENCOUNTER — Other Ambulatory Visit: Payer: Self-pay

## 2019-10-03 ENCOUNTER — Ambulatory Visit: Payer: BLUE CROSS/BLUE SHIELD | Admitting: Family Medicine

## 2019-10-03 ENCOUNTER — Encounter: Payer: Self-pay | Admitting: Family Medicine

## 2019-10-03 VITALS — BP 120/76 | HR 60 | Temp 96.8°F | Ht 70.0 in | Wt 195.8 lb

## 2019-10-03 DIAGNOSIS — M19041 Primary osteoarthritis, right hand: Secondary | ICD-10-CM

## 2019-10-03 DIAGNOSIS — E782 Mixed hyperlipidemia: Secondary | ICD-10-CM | POA: Diagnosis not present

## 2019-10-03 DIAGNOSIS — I10 Essential (primary) hypertension: Secondary | ICD-10-CM

## 2019-10-03 DIAGNOSIS — D508 Other iron deficiency anemias: Secondary | ICD-10-CM

## 2019-10-03 DIAGNOSIS — I2510 Atherosclerotic heart disease of native coronary artery without angina pectoris: Secondary | ICD-10-CM

## 2019-10-03 MED ORDER — ASPIRIN 81 MG PO TBEC: 81.0000 mg | DELAYED_RELEASE_TABLET | Freq: Every day | ORAL | 3 refills | Status: DC

## 2019-10-03 MED ORDER — CELECOXIB 200 MG PO CAPS: 200.0000 mg | ORAL_CAPSULE | Freq: Every day | ORAL | 5 refills | Status: DC

## 2019-10-03 MED ORDER — CLOTRIMAZOLE-BETAMETHASONE 1-0.05 % EX CREA: 1.0000 "application " | TOPICAL_CREAM | Freq: Two times a day (BID) | CUTANEOUS | 5 refills | Status: DC

## 2019-10-03 NOTE — Progress Notes (Signed)
Subjective:  Patient ID: Johnny Foster, male    DOB: Oct 01, 1956  Age: 63 y.o. MRN: [id number]  CC: Medical Management of Chronic Issues (left hand pain)   HPI Johnny Foster presents for  follow-up of hypertension. Patient has no history of headache chest pain or shortness of breath or recent cough. Patient also denies symptoms of TIA such as focal numbness or weakness. Patient denies side effects from medication. States taking it regularly.  Also taking iron for anemia. Needs blood work to check for status.   Hand pain continues. Right worse than left. PIP Joints lock up. Getting worse. Would like referral. History Johnny Foster has a past medical history of Hepatitis C, Iron deficiency anemia (10/02/2018), Myocardial infarction [facility]), and Substance abuse ([place]).   He has a past surgical history that includes Tonsilectomy, adenoidectomy, bilateral myringotomy and tubes (1969).   His family history includes Arthritis in his mother; CAD (age of onset: 54) in his mother; Cancer in his brother; Lung disease in his father.He reports that he has been smoking cigarettes. He has a 40.00 pack-year smoking history. He has never used smokeless tobacco. He reports current alcohol use. He reports previous drug use. Drug: Other-see comments.  Current Outpatient Medications on File Prior to Visit  Medication Sig Dispense Refill  . acetaminophen (TYLENOL) 500 MG tablet Take 500 mg by mouth daily as needed for moderate pain.    Marland Kitchen atorvastatin (LIPITOR) 80 MG tablet TAKE 1 TABLET(80 MG) BY MOUTH DAILY 90 tablet 0  . BRILINTA 90 MG TABS tablet Take 90 mg by mouth 2 (two) times daily.     . Ferrous Fumarate (HEMOCYTE - 106 MG FE) 324 (106 Fe) MG TABS tablet Take 1 tablet (106 mg of iron total) by mouth 2 (two) times daily. 60 tablet 5  . ferrous sulfate 324 MG TBEC Take 1 tablet (324 mg total) by mouth 2 (two) times a day. 60 tablet 6  . lisinopril (ZESTRIL) 5 MG tablet TAKE 1 TABLET(5 MG) BY MOUTH DAILY 90  tablet 0  . metoprolol tartrate (LOPRESSOR) 25 MG tablet TAKE 1 TABLET(25 MG) BY MOUTH TWICE DAILY 180 tablet 0  . nitroGLYCERIN (NITROSTAT) 0.4 MG SL tablet Place 1 tablet (0.4 mg total) under the tongue every 5 (five) minutes as needed for chest pain. 25 tablet prn   No current facility-administered medications on file prior to visit.     ROS Review of Systems  Constitutional: Negative.   HENT: Negative.   Eyes: Negative for visual disturbance.  Respiratory: Negative for cough and shortness of breath.   Cardiovascular: Negative for chest pain and leg swelling.  Gastrointestinal: Negative for abdominal pain, diarrhea, nausea and vomiting.  Genitourinary: Negative for difficulty urinating.  Musculoskeletal: Positive for arthralgias (hands). Negative for myalgias.  Skin: Negative for rash.  Neurological: Negative for headaches.  Psychiatric/Behavioral: Negative for sleep disturbance.    Objective:  BP 120/76   Pulse 60   Temp (!) 96.8 F (36 C) (Temporal)   Ht 5' 10"  (1.778 m)   Wt 195 lb 12.8 oz (88.8 kg)   SpO2 98%   BMI 28.09 kg/m   BP Readings from Last 3 Encounters:  10/03/19 120/76  06/05/19 126/81  05/01/19 114/89    Wt Readings from Last 3 Encounters:  10/03/19 195 lb 12.8 oz (88.8 kg)  06/05/19 188 lb (85.3 kg)  05/01/19 191 lb (86.6 kg)     Physical Exam Constitutional:      General: He is not in acute distress.  Appearance: He is well-developed.  HENT:     Head: Normocephalic and atraumatic.     Right Ear: External ear normal.     Left Ear: External ear normal.     Nose: Nose normal.  Eyes:     Conjunctiva/sclera: Conjunctivae normal.     Pupils: Pupils are equal, round, and reactive to light.  Neck:     Musculoskeletal: Normal range of motion and neck supple.  Cardiovascular:     Rate and Rhythm: Normal rate and regular rhythm.     Heart sounds: Normal heart sounds. No murmur.  Pulmonary:     Effort: Pulmonary effort is normal. No  respiratory distress.     Breath sounds: Normal breath sounds. No wheezing or rales.  Abdominal:     Palpations: Abdomen is soft.     Tenderness: There is no abdominal tenderness.  Musculoskeletal: Normal range of motion.  Skin:    General: Skin is warm and dry.  Neurological:     Mental Status: He is alert and oriented to person, place, and time.     Deep Tendon Reflexes: Reflexes are normal and symmetric.  Psychiatric:        Behavior: Behavior normal.        Thought Content: Thought content normal.        Judgment: Judgment normal.       Assessment & Plan:   Johnny Foster was seen today for medical management of chronic issues.  Diagnoses and all orders for this visit:  ASCVD (arteriosclerotic cardiovascular disease) -     Anemia Profile with CBC -     Cancel: CBC -     Lipid  Essential hypertension -     Anemia Profile with CBC -     Cancel: CBC -     Lipid  Mixed hyperlipidemia -     Anemia Profile with CBC -     Cancel: CBC -     Lipid  Other iron deficiency anemia -     Anemia Profile with CBC -     Cancel: CBC -     Lipid  Arthritis of right hand -     Hand Surg Referral  Other orders -     aspirin (ASPIRIN LOW DOSE) 81 MG EC tablet; Take 1 tablet (81 mg total) by mouth daily. Swallow whole. -     clotrimazole-betamethasone (LOTRISONE) cream; Apply 1 application topically 2 (two) times daily. To affected areas until rash clears -     celecoxib (CELEBREX) 200 MG capsule; Take 1 capsule (200 mg total) by mouth daily. With food -     CMP14+EGFR   Allergies as of 10/03/2019   No Known Allergies     Medication List       Accurate as of October 03, 2019 11:59 PM. If you have any questions, ask your nurse or doctor.        acetaminophen 500 MG tablet Commonly known as: TYLENOL Take 500 mg by mouth daily as needed for moderate pain.   aspirin 81 MG EC tablet Commonly known as: Aspirin Low Dose Take 1 tablet (81 mg total) by mouth daily. Swallow  whole. What changed:   how much to take  additional instructions Changed by: Claretta Fraise, MD   atorvastatin 80 MG tablet Commonly known as: LIPITOR TAKE 1 TABLET(80 MG) BY MOUTH DAILY   Brilinta 90 MG Tabs tablet Generic drug: ticagrelor Take 90 mg by mouth 2 (two) times daily.   celecoxib 200 MG  capsule Commonly known as: CeleBREX Take 1 capsule (200 mg total) by mouth daily. With food Started by: Claretta Fraise, MD   clotrimazole-betamethasone cream Commonly known as: Lotrisone Apply 1 application topically 2 (two) times daily. To affected areas until rash clears   Ferrous Fumarate 324 (106 Fe) MG Tabs tablet Commonly known as: HEMOCYTE - 106 mg FE Take 1 tablet (106 mg of iron total) by mouth 2 (two) times daily.   ferrous sulfate 324 MG Tbec Take 1 tablet (324 mg total) by mouth 2 (two) times a day.   lisinopril 5 MG tablet Commonly known as: ZESTRIL TAKE 1 TABLET(5 MG) BY MOUTH DAILY   metoprolol tartrate 25 MG tablet Commonly known as: LOPRESSOR TAKE 1 TABLET(25 MG) BY MOUTH TWICE DAILY   nitroGLYCERIN 0.4 MG SL tablet Commonly known as: NITROSTAT Place 1 tablet (0.4 mg total) under the tongue every 5 (five) minutes as needed for chest pain.       Meds ordered this encounter  Medications  . aspirin (ASPIRIN LOW DOSE) 81 MG EC tablet    Sig: Take 1 tablet (81 mg total) by mouth daily. Swallow whole.    Dispense:  90 tablet    Refill:  3  . clotrimazole-betamethasone (LOTRISONE) cream    Sig: Apply 1 application topically 2 (two) times daily. To affected areas until rash clears    Dispense:  45 g    Refill:  5  . celecoxib (CELEBREX) 200 MG capsule    Sig: Take 1 capsule (200 mg total) by mouth daily. With food    Dispense:  30 capsule    Refill:  5      Follow-up: Return in about 4 months (around 01/31/2020).  Claretta Fraise, M.D.

## 2019-10-04 LAB — ANEMIA PROFILE B
Basophils Absolute: 0.1 10*3/uL (ref 0.0–0.2)
Basos: 0 %
EOS (ABSOLUTE): 0.5 10*3/uL — ABNORMAL HIGH (ref 0.0–0.4)
Eos: 4 %
Ferritin: 45 ng/mL (ref 30–400)
Folate: 10.9 ng/mL (ref >3.0)
Hematocrit: 49.8 % (ref 37.5–51.0)
Hemoglobin: 16.1 g/dL (ref 13.0–17.7)
Immature Grans (Abs): 0 10*3/uL (ref 0.0–0.1)
Immature Granulocytes: 0 %
Iron Saturation: 26 % (ref 15–55)
Iron: 94 ug/dL (ref 38–169)
Lymphocytes Absolute: 2.2 10*3/uL (ref 0.7–3.1)
Lymphs: 20 %
MCH: 30.6 pg (ref 26.6–33.0)
MCHC: 32.3 g/dL (ref 31.5–35.7)
MCV: 95 fL (ref 79–97)
Monocytes Absolute: 1 10*3/uL — ABNORMAL HIGH (ref 0.1–0.9)
Monocytes: 9 %
Neutrophils Absolute: 7.6 10*3/uL — ABNORMAL HIGH (ref 1.4–7.0)
Neutrophils: 67 %
Platelets: 249 10*3/uL (ref 150–450)
RBC: 5.26 x10E6/uL (ref 4.14–5.80)
RDW: 14.2 % (ref 11.6–15.4)
Retic Ct Pct: 1.7 % (ref 0.6–2.6)
Total Iron Binding Capacity: 360 ug/dL (ref 250–450)
UIBC: 266 ug/dL (ref 111–343)
Vitamin B-12: 332 pg/mL (ref 232–1245)
WBC: 11.3 10*3/uL — ABNORMAL HIGH (ref 3.4–10.8)

## 2019-10-04 LAB — CMP14+EGFR
ALT: 14 IU/L (ref 0–44)
AST: 17 IU/L (ref 0–40)
Albumin/Globulin Ratio: 2.1 (ref 1.2–2.2)
Albumin: 4.7 g/dL (ref 3.8–4.8)
Alkaline Phosphatase: 73 IU/L (ref 39–117)
BUN/Creatinine Ratio: 11 (ref 10–24)
BUN: 10 mg/dL (ref 8–27)
Bilirubin Total: 0.4 mg/dL (ref 0.0–1.2)
CO2: 19 mmol/L — ABNORMAL LOW (ref 20–29)
Calcium: 9.7 mg/dL (ref 8.6–10.2)
Chloride: 107 mmol/L — ABNORMAL HIGH (ref 96–106)
Creatinine, Ser: 0.95 mg/dL (ref 0.76–1.27)
GFR calc Af Amer: 99 mL/min/{1.73_m2} (ref >59)
GFR calc non Af Amer: 86 mL/min/{1.73_m2} (ref >59)
Globulin, Total: 2.2 g/dL (ref 1.5–4.5)
Glucose: 87 mg/dL (ref 65–99)
Potassium: 4.7 mmol/L (ref 3.5–5.2)
Sodium: 139 mmol/L (ref 134–144)
Total Protein: 6.9 g/dL (ref 6.0–8.5)

## 2019-10-04 LAB — LIPID PANEL
Chol/HDL Ratio: 2.1 ratio (ref 0.0–5.0)
Cholesterol, Total: 125 mg/dL (ref 100–199)
HDL: 60 mg/dL (ref >39)
LDL Chol Calc (NIH): 50 mg/dL (ref 0–99)
Triglycerides: 76 mg/dL (ref 0–149)
VLDL Cholesterol Cal: 15 mg/dL (ref 5–40)

## 2019-10-07 NOTE — Progress Notes (Signed)
Hello Prithvi,  Your lab result is normal and/or stable.Some minor variations that are not significant are commonly marked abnormal, but do not represent any medical problem for you.  Best regards, Aeneas Longsworth, M.D.

## 2019-10-08 ENCOUNTER — Encounter: Payer: Self-pay | Admitting: Family Medicine

## 2019-10-10 DIAGNOSIS — M720 Palmar fascial fibromatosis [Dupuytren]: Secondary | ICD-10-CM | POA: Insufficient documentation

## 2019-10-10 DIAGNOSIS — M65331 Trigger finger, right middle finger: Secondary | ICD-10-CM | POA: Insufficient documentation

## 2019-10-22 NOTE — Progress Notes (Signed)
Cardiology Office Note   Date:  10/23/2019   ID:  Johnny Foster, DOB Aug 09, 1956, MRN [id number]  PCP:  Mechele Claude, MD  Cardiologist:   Rollene Rotunda, MD     Chief Complaint  Patient presents with  . Coronary Artery Disease      History of Present Illness: Johnny Foster is a 63 y.o. male who presents for follow up of CAD  He had a cardiac cath in June 2019 in [place] after presenting with a acute inferolateral MI.  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  EF was normal.      Since I last saw him he has had no new cardiovascular complaints.  He does walk his dog twice a day. The patient denies any new symptoms such as chest discomfort, neck or arm discomfort. There has been no new shortness of breath, PND or orthopnea. There have been no reported palpitations, presyncope or syncope.    Past Medical History:  Diagnosis Date  . Hepatitis C    Treated  . Iron deficiency anemia 10/02/2018  . Myocardial infarction [facility])    May 2019.  LAD moderate 60 to 80% stenosis.  Circumflex OM occluded.  RCA 80% stenosis.  OM was thought to be a acute infarct vessel.  It was treated with a Synergy stent.  This is a 2.5 x 20 mm, RCA was treated with a 3.  3.0 by 16 mm Synergy.  Elective PCI in June of an LAD lesion with 3.0 x 38 mm Synergy.  . 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 TONSILECTOMY, ADENOIDECTOMY, BILATERAL MYRINGOTOMY AND TUBES  1969     Current Outpatient Medications  Medication Sig Dispense Refill  . acetaminophen (TYLENOL) 500 MG tablet Take 500 mg by mouth daily as needed for moderate pain.    Marland Kitchen aspirin (ASPIRIN LOW DOSE) 81 MG EC tablet Take 1 tablet (81 mg total) by mouth daily. Swallow whole. 90 tablet 3  . atorvastatin (LIPITOR) 80 MG tablet TAKE 1 TABLET(80 MG) BY MOUTH DAILY 90 tablet 0  . BRILINTA 90 MG  TABS tablet Take 90 mg by mouth 2 (two) times daily.     . clotrimazole-betamethasone (LOTRISONE) cream Apply 1 application topically 2 (two) times daily. To affected areas until rash clears 45 g 5  . Ferrous Fumarate (HEMOCYTE - 106 MG FE) 324 (106 Fe) MG TABS tablet Take 1 tablet (106 mg of iron total) by mouth 2 (two) times daily. 60 tablet 5  . ferrous sulfate 324 MG TBEC Take 1 tablet (324 mg total) by mouth 2 (two) times a day. 60 tablet 6  . lisinopril (ZESTRIL) 5 MG tablet TAKE 1 TABLET(5 MG) BY MOUTH DAILY 90 tablet 0  . metoprolol tartrate (LOPRESSOR) 25 MG tablet TAKE 1 TABLET(25 MG) BY MOUTH TWICE DAILY 180 tablet 0  . nitroGLYCERIN (NITROSTAT) 0.4 MG SL tablet Place 1 tablet (0.4 mg total) under the tongue every 5 (five) minutes as needed for chest pain. 25 tablet prn   No current facility-administered medications for this visit.     Allergies:   Patient has no known allergies.    ROS:  Please see the history of present illness.   Otherwise, review of systems are positive for none.   All other systems are reviewed and negative.  PHYSICAL EXAM: VS:  BP 111/74   Pulse 66   Ht 5\' 10"  (1.778 m)   Wt 194 lb (88 kg)   BMI 27.84 kg/m  , BMI Body mass index is 27.84 kg/m. GENERAL:  Well appearing NECK:  No jugular venous distention, waveform within normal limits, carotid upstroke brisk and symmetric, no bruits, no thyromegaly LUNGS:  Clear to auscultation bilaterally CHEST:  Unremarkable HEART:  PMI not displaced or sustained,S1 and S2 within normal limits, no S3, no S4, no clicks, no rubs, no murmurs ABD:  Flat, positive bowel sounds normal in frequency in pitch, no bruits, no rebound, no guarding, no midline pulsatile mass, no hepatomegaly, no splenomegaly EXT:  2 plus pulses throughout, no edema, no cyanosis no clubbing   EKG:  EKG is ordered today. The ekg ordered today demonstrates sinus rhythm, rate 66, axis within normal limits, intervals within normal limits, early  repolarization changes unchanged from previous.   Recent Labs: 10/03/2019: ALT 14; BUN 10; Creatinine, Ser 0.95; Hemoglobin 16.1; Platelets 249; Potassium 4.7; Sodium 139    Lipid Panel    Component Value Date/Time   CHOL 125 10/03/2019 0948   TRIG 76 10/03/2019 0948   HDL 60 10/03/2019 0948   CHOLHDL 2.1 10/03/2019 0948   LDLCALC 50 10/03/2019 0948      Wt Readings from Last 3 Encounters:  10/23/19 194 lb (88 kg)  10/03/19 195 lb 12.8 oz (88.8 kg)  06/05/19 188 lb (85.3 kg)      Other studies Reviewed: Additional studies/ records that were reviewed today include: Labs. Review of the above records demonstrates:  Please see elsewhere in the note.     ASSESSMENT AND PLAN:  CAD:The patient has no new sypmtoms.  No further cardiovascular testing is indicated.  We will continue with aggressive risk reduction and meds as listed.  TOBACCO ABUSE :   We have talked about stopping smoking several times in the past.   DYSLIPIDEMIA:    LDL was 50.  HDL 60.  Continue current therapy.   COVID 19: We had a long discussion about the vaccine and when it might be safe to travel.  I answered all his questions.    Current medicines are reviewed at length with the patient today.  The patient does not have concerns regarding medicines.  The following changes have been made:  no change  Labs/ tests ordered today include: None  Orders Placed This Encounter  Procedures  . EKG 12-Lead     Disposition:   FU with in six months.     Signed, Foster Breeding, MD  10/23/2019 2:54 PM    [place]

## 2019-10-23 ENCOUNTER — Encounter: Payer: Self-pay | Admitting: Cardiology

## 2019-10-23 ENCOUNTER — Other Ambulatory Visit: Payer: Self-pay

## 2019-10-23 ENCOUNTER — Ambulatory Visit (INDEPENDENT_AMBULATORY_CARE_PROVIDER_SITE_OTHER): Payer: BLUE CROSS/BLUE SHIELD | Admitting: Cardiology

## 2019-10-23 VITALS — BP 111/74 | HR 66 | Ht 70.0 in | Wt 194.0 lb

## 2019-10-23 DIAGNOSIS — Z720 Tobacco use: Secondary | ICD-10-CM | POA: Diagnosis not present

## 2019-10-23 DIAGNOSIS — E785 Hyperlipidemia, unspecified: Secondary | ICD-10-CM

## 2019-10-23 DIAGNOSIS — I2510 Atherosclerotic heart disease of native coronary artery without angina pectoris: Secondary | ICD-10-CM | POA: Diagnosis not present

## 2019-10-23 DIAGNOSIS — Z7189 Other specified counseling: Secondary | ICD-10-CM | POA: Diagnosis not present

## 2019-10-23 NOTE — Patient Instructions (Signed)
Medication Instructions:  The current medical regimen is effective;  continue present plan and medications.  *If you need a refill on your cardiac medications before your next appointment, please call your pharmacy*  Follow-Up: At [facility], you and your health needs are our priority.  As part of our continuing mission to provide you with exceptional heart care, we have created designated Provider Care Teams.  These Care Teams include your primary Cardiologist (physician) and Advanced Practice Providers (APPs -  Physician Assistants and Nurse Practitioners) who all work together to provide you with the care you need, when you need it.  Your next appointment:   6 month(s)  The format for your next appointment:   In Person  Provider:   James Hochrein, MD  Thank you for choosing [facility]!!     

## 2019-12-07 ENCOUNTER — Other Ambulatory Visit: Payer: Self-pay

## 2019-12-07 ENCOUNTER — Encounter (HOSPITAL_COMMUNITY): Payer: Self-pay | Admitting: Emergency Medicine

## 2019-12-07 ENCOUNTER — Emergency Department (HOSPITAL_COMMUNITY)
Admission: EM | Admit: 2019-12-07 | Discharge: 2019-12-07 | Disposition: A | Discharge status: Home / Self Care | Payer: BLUE CROSS/BLUE SHIELD | Attending: Emergency Medicine | Admitting: Emergency Medicine

## 2019-12-07 ENCOUNTER — Emergency Department (HOSPITAL_COMMUNITY): Payer: BLUE CROSS/BLUE SHIELD

## 2019-12-07 DIAGNOSIS — Z79899 Other long term (current) drug therapy: Secondary | ICD-10-CM | POA: Diagnosis not present

## 2019-12-07 DIAGNOSIS — Z955 Presence of coronary angioplasty implant and graft: Secondary | ICD-10-CM | POA: Insufficient documentation

## 2019-12-07 DIAGNOSIS — R0789 Other chest pain: Secondary | ICD-10-CM | POA: Insufficient documentation

## 2019-12-07 DIAGNOSIS — Z7982 Long term (current) use of aspirin: Secondary | ICD-10-CM | POA: Diagnosis not present

## 2019-12-07 DIAGNOSIS — F17210 Nicotine dependence, cigarettes, uncomplicated: Secondary | ICD-10-CM | POA: Diagnosis not present

## 2019-12-07 DIAGNOSIS — I10 Essential (primary) hypertension: Secondary | ICD-10-CM | POA: Diagnosis not present

## 2019-12-07 DIAGNOSIS — I252 Old myocardial infarction: Secondary | ICD-10-CM | POA: Diagnosis not present

## 2019-12-07 DIAGNOSIS — R079 Chest pain, unspecified: Secondary | ICD-10-CM

## 2019-12-07 LAB — BASIC METABOLIC PANEL
Anion gap: 8 (ref 5–15)
BUN: 16 mg/dL (ref 8–23)
CO2: 22 mmol/L (ref 22–32)
Calcium: 9.3 mg/dL (ref 8.9–10.3)
Chloride: 110 mmol/L (ref 98–111)
Creatinine, Ser: 0.84 mg/dL (ref 0.61–1.24)
GFR calc Af Amer: >60 mL/min (ref >60)
GFR calc non Af Amer: >60 mL/min (ref >60)
Glucose, Bld: 95 mg/dL (ref 70–99)
Potassium: 4 mmol/L (ref 3.5–5.1)
Sodium: 140 mmol/L (ref 135–145)

## 2019-12-07 LAB — CBC
HCT: 49.6 % (ref 39.0–52.0)
Hemoglobin: 16.4 g/dL (ref 13.0–17.0)
MCH: 31.7 pg (ref 26.0–34.0)
MCHC: 33.1 g/dL (ref 30.0–36.0)
MCV: 95.9 fL (ref 80.0–100.0)
Platelets: 241 10*3/uL (ref 150–400)
RBC: 5.17 MIL/uL (ref 4.22–5.81)
RDW: 13.8 % (ref 11.5–15.5)
WBC: 9.5 10*3/uL (ref 4.0–10.5)
nRBC: 0 % (ref 0.0–0.2)

## 2019-12-07 LAB — TROPONIN I (HIGH SENSITIVITY)
Troponin I (High Sensitivity): <2 ng/L (ref <18)
Troponin I (High Sensitivity): <2 ng/L (ref <18)

## 2019-12-07 LAB — CBG MONITORING, ED: Glucose-Capillary: 148 mg/dL — ABNORMAL HIGH (ref 70–99)

## 2019-12-07 MED ORDER — ASPIRIN 81 MG PO CHEW
324.0000 mg | CHEWABLE_TABLET | Freq: Once | ORAL | Status: AC
  Administered 2019-12-07: 324 mg via ORAL
  Filled 2019-12-07: qty 4

## 2019-12-07 MED ORDER — SODIUM CHLORIDE 0.9% FLUSH
3.0000 mL | Freq: Once | INTRAVENOUS | Status: AC
  Administered 2019-12-07: 3 mL via INTRAVENOUS

## 2019-12-07 MED ORDER — NITROGLYCERIN 0.4 MG SL SUBL
0.4000 mg | SUBLINGUAL_TABLET | Freq: Once | SUBLINGUAL | Status: AC
  Administered 2019-12-07: 0.4 mg via SUBLINGUAL
  Filled 2019-12-07: qty 1

## 2019-12-07 NOTE — ED Notes (Signed)
JI to assess  Second trop being drawn  Pt reports that he came to ED after high blood pressure of 167 over something  Reports that he came here for evaluation   His doctor is out of the country

## 2019-12-07 NOTE — ED Notes (Signed)
Pt reports he has only been in [place] for 2 years   Needs to find a [place] as he was followed by [place] in [place]  He is informed that he can transfer [facility] to [place] or [place] in [place] Will Need his DOD DA 214

## 2019-12-07 NOTE — ED Provider Notes (Signed)
[facility] EMERGENCY DEPARTMENT Provider Note   CSN: [id number] Arrival date & time: 12/07/19  1343     History Chief Complaint  Patient presents with  . Chest Pain    Johnny Foster is a 64 y.o. male with a history of MI 5/19 with stenting x 3, Dr. Antoine Foster cardiologist presenting with left localized chest wall pain described as a pinching sensation that started around 12 noon today.  He denies diaphoresis, n/v, palpitations.  He denies sob but states he was breathing faster and more shallow then normal when the pain first started which he decided was due to anxiety.  He continues to have persistent mild pain without any worsening of sx.  No radiation.  He states similar initial sx with previous MI but expanded to crushing pressure across his chest which was not present today.  He has had no tx prior to arrival.  He did take his asa 81 mg this am (takes daily) and his other morning meds.  States he bowled 4-5 games last night without any sign of chest discomfort.  His pain is not reproducible with movement or palpation.   The history is provided by the patient.    HPI: A 64 year old patient with a history of hypertension and hypercholesterolemia presents for evaluation of chest pain. Initial onset of pain was approximately 3-6 hours ago. The patient's chest pain is well-localized, is not worse with exertion and is relieved by nitroglycerin. The patient's chest pain is middle- or left-sided, is not described as heaviness/pressure/tightness, is not sharp and does not radiate to the arms/jaw/neck. The patient does not complain of nausea and denies diaphoresis. The patient has smoked in the past 90 days. The patient has no history of stroke, has no history of peripheral artery disease, denies any history of treated diabetes, has no relevant family history of coronary artery disease (first degree relative at less than age 20) and does not have an elevated BMI (>=30).   Past Medical History:    Diagnosis Date  . Hepatitis C    Treated  . Iron deficiency anemia 10/02/2018  . Myocardial infarction [facility])    May 2019.  LAD moderate 60 to 80% stenosis.  Circumflex OM occluded.  RCA 80% stenosis.  OM was thought to be a acute infarct vessel.  It was treated with a Synergy stent.  This is a 2.5 x 20 mm, RCA was treated with a 3.  3.0 by 16 mm Synergy.  Elective PCI in June of an LAD lesion with 3.0 x 38 mm Synergy.  . 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 Absolute anemia 10/11/2018  . Iron deficiency anemia 10/02/2018  . Encounter for long-term (current) use of medications 09/19/2018  . Tobacco abuse 09/19/2018  . ASCVD (arteriosclerotic cardiovascular disease) 09/09/2018  . Essential hypertension 09/09/2018  . Mixed hyperlipidemia 09/09/2018    Past Surgical History:  Procedure Laterality Date  . TONSILECTOMY, ADENOIDECTOMY, BILATERAL MYRINGOTOMY AND TUBES  1969       Family History  Problem Relation Age of Onset  . Arthritis Mother   . CAD Mother 44  . Lung disease Father   . Cancer Brother     Social History   Tobacco Use  . Smoking status: Current Every Day Smoker    Packs/day: 1.00    Years: 40.00    Pack years: 40.00    Types: Cigarettes  .  Smokeless tobacco: Never Used  Substance Use Topics  . Alcohol use: Yes    Comment: rarely  . Drug use: Not Currently    Types: Other-see comments    Comment: history of Heroin and Cocaine use in his teens     Home Medications Prior to Admission medications   Medication Sig Start Date End Date Taking? Authorizing Provider  acetaminophen (TYLENOL) 500 MG tablet Take 500 mg by mouth daily as needed for moderate pain.    [provider]  aspirin (ASPIRIN LOW DOSE) 81 MG EC tablet Take 1 tablet (81 mg total) by mouth daily. Swallow whole. 10/03/19   Claretta Fraise, MD  atorvastatin (LIPITOR) 80 MG tablet TAKE 1 TABLET(80 MG)  BY MOUTH DAILY 09/02/19   Claretta Fraise, MD  BRILINTA 90 MG TABS tablet Take 90 mg by mouth 2 (two) times daily.  12/06/18   [provider]  clotrimazole-betamethasone (LOTRISONE) cream Apply 1 application topically 2 (two) times daily. To affected areas until rash clears 10/03/19   Claretta Fraise, MD  Ferrous Fumarate (HEMOCYTE - 106 MG FE) 324 (106 Fe) MG TABS tablet Take 1 tablet (106 mg of iron total) by mouth 2 (two) times daily. 06/06/19   Claretta Fraise, MD  ferrous sulfate 324 MG TBEC Take 1 tablet (324 mg total) by mouth 2 (two) times a day. 06/06/19   Claretta Fraise, MD  lisinopril (ZESTRIL) 5 MG tablet TAKE 1 TABLET(5 MG) BY MOUTH DAILY 09/02/19   Claretta Fraise, MD  metoprolol tartrate (LOPRESSOR) 25 MG tablet TAKE 1 TABLET(25 MG) BY MOUTH TWICE DAILY 09/02/19   Claretta Fraise, MD  nitroGLYCERIN (NITROSTAT) 0.4 MG SL tablet Place 1 tablet (0.4 mg total) under the tongue every 5 (five) minutes as needed for chest pain. 09/19/18   Minus Breeding, MD    Allergies    Patient has no known allergies.  Review of Systems   Review of Systems  Constitutional: Negative for chills, diaphoresis and fever.  HENT: Negative for congestion and sore throat.   Eyes: Negative.   Respiratory: Negative for cough, chest tightness and shortness of breath.   Cardiovascular: Positive for chest pain. Negative for palpitations and leg swelling.  Gastrointestinal: Negative for abdominal pain, nausea and vomiting.  Genitourinary: Negative.   Musculoskeletal: Negative for arthralgias, joint swelling and neck pain.  Skin: Negative.  Negative for rash and wound.  Neurological: Negative for dizziness, weakness, light-headedness, numbness and headaches.  Psychiatric/Behavioral: Negative.     Physical Exam Updated Vital Signs BP 128/69 (BP Location: Right Arm)   Pulse 69   Temp 98.1 F (36.7 C) (Oral)   Resp 16   Ht 5\' 10"  (1.778 m)   Wt 88.5 kg   SpO2 100%   BMI 27.98 kg/m   Physical  Exam Vitals and nursing note reviewed.  Constitutional:      Appearance: He is well-developed.  HENT:     Head: Normocephalic and atraumatic.  Eyes:     Conjunctiva/sclera: Conjunctivae normal.  Cardiovascular:     Rate and Rhythm: Normal rate and regular rhythm.     Heart sounds: Normal heart sounds. No murmur. No friction rub.  Pulmonary:     Effort: Pulmonary effort is normal.     Breath sounds: Normal breath sounds. No decreased breath sounds, wheezing, rhonchi or rales.  Abdominal:     General: Bowel sounds are normal.     Palpations: Abdomen is soft.     Tenderness: There is no abdominal tenderness.  Musculoskeletal:  General: Normal range of motion.     Cervical back: Normal range of motion.  Skin:    General: Skin is warm and dry.  Neurological:     Mental Status: He is alert.     ED Results / Procedures / Treatments   Labs (all labs ordered are listed, but only abnormal results are displayed) Labs Reviewed  CBG MONITORING, ED - Abnormal; Notable for the following components:      Result Value   Glucose-Capillary 148 (*)    All other components within normal limits  BASIC METABOLIC PANEL  CBC  TROPONIN I (HIGH SENSITIVITY)  TROPONIN I (HIGH SENSITIVITY)    EKG EKG Interpretation  Date/Time:  Saturday December 07 2019 13:59:08 EST Ventricular Rate:  75 PR Interval:  150 QRS Duration: 86 QT Interval:  398 QTC Calculation: 444 R Axis:   49 Text Interpretation: Normal sinus rhythm Normal ECG Confirmed by Donnetta Hutching ([id number]) on 12/07/2019 5:34:23 PM   Radiology DG Chest 2 View  Result Date: 12/07/2019 CLINICAL DATA:  Chest pain. EXAM: CHEST - 2 VIEW COMPARISON:  Chest CT October 31, 2018 FINDINGS: Cardiomediastinal silhouette is normal. Mediastinal contours appear intact. There is no evidence of focal airspace consolidation, pleural effusion or pneumothorax. Osseous structures are without acute abnormality. Soft tissues are grossly normal. IMPRESSION:  No active cardiopulmonary disease. Electronically Signed   By: Ted Mcalpine M.D.   On: 12/07/2019 14:20    Procedures Procedures (including critical care time)  Medications Ordered in ED Medications  sodium chloride flush (NS) 0.9 % injection 3 mL (3 mLs Intravenous Given 12/07/19 1704)  aspirin chewable tablet 324 mg (324 mg Oral Given 12/07/19 1705)  nitroGLYCERIN (NITROSTAT) SL tablet 0.4 mg (0.4 mg Sublingual Given 12/07/19 1705)    ED Course  I have reviewed the triage vital signs and the nursing notes.  Pertinent labs & imaging results that were available during my care of the patient were reviewed by me and considered in my medical decision making (see chart for details).    MDM Rules/Calculators/A&P HEAR Score: 3                    Pt with localized chest pain, atypical presentation with normal labs including a delta troponin.  ekg and cxr also stable.  He was given one SL ntg here with resolution of sx.  He has ntg, brought with him, expires 5/21.  Discussed role of this medicine and he understands its use. Felt appropriate for dc home with close cardiology f/u.  Pt understands to call for appt with Dr. Antoine Foster.    Pt seen by Dr. Adriana Simas during this visit.  Final Clinical Impression(s) / ED Diagnoses Final diagnoses:  Chest pain, unspecified type    Rx / DC Orders ED Discharge Orders    None       Victoriano Lain 12/07/19 1840    Donnetta Hutching, MD 12/08/19 1727

## 2019-12-07 NOTE — Discharge Instructions (Addendum)
Your lab tests, ekg and chest xray is stable today.  I do recommend an office followup with Dr. Antoine Poche. Call on Monday to set up an appointment.  In the interim, get rechecked for any return or worsening of symptoms.

## 2019-12-07 NOTE — ED Notes (Signed)
EKG shows NSR  Pt in hall due to no bed

## 2019-12-07 NOTE — ED Triage Notes (Signed)
Patient complains of chest pain that began around noon. Patient has a history of MI in the past. Only medication taken prior to arrival was tylenol.

## 2019-12-07 NOTE — ED Notes (Signed)
Awaiting recheck and dispo 

## 2019-12-09 ENCOUNTER — Ambulatory Visit (INDEPENDENT_AMBULATORY_CARE_PROVIDER_SITE_OTHER): Payer: BLUE CROSS/BLUE SHIELD | Admitting: Cardiology

## 2019-12-09 ENCOUNTER — Encounter: Payer: Self-pay | Admitting: Cardiology

## 2019-12-09 ENCOUNTER — Telehealth: Payer: Self-pay | Admitting: Cardiology

## 2019-12-09 ENCOUNTER — Other Ambulatory Visit: Payer: Self-pay

## 2019-12-09 VITALS — BP 114/76 | HR 57 | Temp 97.0°F | Ht 70.0 in | Wt 191.0 lb

## 2019-12-09 DIAGNOSIS — R079 Chest pain, unspecified: Secondary | ICD-10-CM

## 2019-12-09 DIAGNOSIS — Z7189 Other specified counseling: Secondary | ICD-10-CM | POA: Diagnosis not present

## 2019-12-09 NOTE — Telephone Encounter (Signed)
-----   Message from Teressa Senter, RN sent at 12/09/2019  8:36 AM EST ----- Regarding: Please Schedule Could you please schedule a follow up appointment with Dr. Antoine Poche due to patient being in the ER recently for chest pain? Thank you! Dorathy Daft

## 2019-12-09 NOTE — Telephone Encounter (Signed)
Patient scheduled for 1/18 at 11:40 am with Dr. Antoine Poche.

## 2019-12-09 NOTE — Patient Instructions (Signed)
Medication Instructions:  No Changes *If you need a refill on your cardiac medications before your next appointment, please call your pharmacy*  Lab Work: None  Testing/Procedures: None  Follow-Up: At [facility], you and your health needs are our priority.  As part of our continuing mission to provide you with exceptional heart care, we have created designated Provider Care Teams.  These Care Teams include your primary Cardiologist (physician) and Advanced Practice Providers (APPs -  Physician Assistants and Nurse Practitioners) who all work together to provide you with the care you need, when you need it.  Your next appointment:   6 month(s)  The format for your next appointment:   In Person  Provider:   Rollene Rotunda, MD  Other Instructions  [phone] [place] [phone] [place] [phone] [place]  COVID-19 Vaccine Information can be found at: [email or web address] For questions related to vaccine distribution or appointments, please email [email or web address] or call [phone].   [email or web address]

## 2019-12-09 NOTE — Progress Notes (Signed)
Cardiology Office Note   Date:  12/09/2019   ID:  Johnny Foster, DOB November 10, 1956, MRN [id number]  PCP:  Claretta Fraise, MD  Cardiologist:   Minus Breeding, MD     Chief Complaint  Patient presents with  . Chest Pain      History of Present Illness: Johnny Foster is a 64 y.o. male who presents for follow up of CAD  He had a cardiac cath in June 2019 in [place] after presenting with a acute inferolateral MI.  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  EF was normal.      Since I last saw him he was in the emergency room.  This was just 2 days ago.   I reviewed these records for this visit.  The discomfort was felt to be atypical.  I troponin was negative .  However, he was referred here for follow-up.  He said that the pain was sharp.  There was a pinpoint location in the mid chest.  It was worse when he moved forward.  He started to panic he thought.  He was not like his previous angina.  The day before he had gone bowling.  Otherwise he been active things like erecting swingset.  He not been having any symptoms with that.  He denies any new shortness of breath, PND or orthopnea.  He said no palpitations, presyncope or syncope.  Past Medical History:  Diagnosis Date  . Hepatitis C    Treated  . Iron deficiency anemia 10/02/2018  . Myocardial infarction [facility])    May 2019.  LAD moderate 60 to 80% stenosis.  Circumflex OM occluded.  RCA 80% stenosis.  OM was thought to be a acute infarct vessel.  It was treated with a Synergy stent.  This is a 2.5 x 20 mm, RCA was treated with a 3.  3.0 by 16 mm Synergy.  Elective PCI in June of an LAD lesion with 3.0 x 38 mm Synergy.  . 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 TONSILECTOMY, ADENOIDECTOMY, BILATERAL MYRINGOTOMY AND TUBES  1969     Current Outpatient Medications  Medication  Sig Dispense Refill  . acetaminophen (TYLENOL) 500 MG tablet Take 500 mg by mouth daily as needed for moderate pain.    Marland Kitchen aspirin (ASPIRIN LOW DOSE) 81 MG EC tablet Take 1 tablet (81 mg total) by mouth daily. Swallow whole. 90 tablet 3  . atorvastatin (LIPITOR) 80 MG tablet TAKE 1 TABLET(80 MG) BY MOUTH DAILY 90 tablet 0  . clotrimazole-betamethasone (LOTRISONE) cream Apply 1 application topically 2 (two) times daily. To affected areas until rash clears 45 g 5  . ferrous sulfate 324 MG TBEC Take 1 tablet (324 mg total) by mouth 2 (two) times a day. 60 tablet 6  . lisinopril (ZESTRIL) 5 MG tablet TAKE 1 TABLET(5 MG) BY MOUTH DAILY 90 tablet 0  . metoprolol tartrate (LOPRESSOR) 25 MG tablet TAKE 1 TABLET(25 MG) BY MOUTH TWICE DAILY 180 tablet 0  . nitroGLYCERIN (NITROSTAT) 0.4 MG SL tablet Place 1 tablet (0.4 mg total) under the tongue every 5 (five) minutes as needed for chest pain. 25 tablet prn  . BRILINTA 90 MG TABS tablet Take 90 mg by mouth 2 (two) times daily.     . Ferrous Fumarate (HEMOCYTE - 106  MG FE) 324 (106 Fe) MG TABS tablet Take 1 tablet (106 mg of iron total) by mouth 2 (two) times daily. (Patient not taking: Reported on 12/09/2019) 60 tablet 5   No current facility-administered medications for this visit.    Allergies:   Patient has no known allergies.    ROS:  Please see the history of present illness.   Otherwise, review of systems are positive for none.   All other systems are reviewed and negative.    PHYSICAL EXAM: VS:  BP 114/76   Pulse (!) 57   Temp (!) 97 F (36.1 C)   Ht 5\' 10"  (1.778 m)   Wt 191 lb (86.6 kg)   SpO2 98%   BMI 27.41 kg/m  , BMI Body mass index is 27.41 kg/m. GENERAL:  Well appearing NECK:  No jugular venous distention, waveform within normal limits, carotid upstroke brisk and symmetric, no bruits, no thyromegaly LUNGS:  Clear to auscultation bilaterally CHEST:  Unremarkable HEART:  PMI not displaced or sustained,S1 and S2 within normal  limits, no S3, no S4, no clicks, no rubs, no murmurs ABD:  Flat, positive bowel sounds normal in frequency in pitch, no bruits, no rebound, no guarding, no midline pulsatile mass, no hepatomegaly, no splenomegaly EXT:  2 plus pulses throughout, no edema, no cyanosis no clubbing  EKG:  EKG is  ordered today. The ekg ordered today demonstrates sinus rhythm, rate 57, axis within normal limits, intervals within normal limits, early repolarization changes unchanged from previous.   Recent Labs: 10/03/2019: ALT 14 12/07/2019: BUN 16; Creatinine, Ser 0.84; Hemoglobin 16.4; Platelets 241; Potassium 4.0; Sodium 140    Lipid Panel    Component Value Date/Time   CHOL 125 10/03/2019 0948   TRIG 76 10/03/2019 0948   HDL 60 10/03/2019 0948   CHOLHDL 2.1 10/03/2019 0948   LDLCALC 50 10/03/2019 0948      Wt Readings from Last 3 Encounters:  12/09/19 191 lb (86.6 kg)  12/07/19 195 lb (88.5 kg)  10/23/19 194 lb (88 kg)      Other studies Reviewed: Additional studies/ records that were reviewed today include: ED records. Review of the above records demonstrates:  Please see elsewhere in the note.     ASSESSMENT AND PLAN:  CAD:The patient has no new sypmtoms.  No further cardiovascular testing is indicated.  We will continue with aggressive risk reduction and meds as listed.  I reviewed the symptoms carefully and the ER evaluation I think it was nonanginal chest pain this acute episode.  He needs continued risk reduction  TOBACCO ABUSE :  We talked again about the need to stop smoking and he says "I am working on it.  DYSLIPIDEMIA:    LDL was 45.  No change in therapy.   COVID 19: He is very interested in getting the vaccine when able.   Current medicines are reviewed at length with the patient today.  The patient does not have concerns regarding medicines.  The following changes have been made:  None  Labs/ tests ordered today include: None  No orders of the defined types were  placed in this encounter.    Disposition:   FU with in 6 months.     Signed, 14/02/20, MD  12/09/2019 12:32 PM    [facility]

## 2019-12-11 ENCOUNTER — Ambulatory Visit (HOSPITAL_COMMUNITY)
Admission: RE | Admit: 2019-12-11 | Discharge: 2019-12-11 | Disposition: A | Discharge status: Home / Self Care | Payer: BLUE CROSS/BLUE SHIELD | Source: Ambulatory Visit | Attending: Acute Care | Admitting: Acute Care

## 2019-12-11 ENCOUNTER — Other Ambulatory Visit: Payer: Self-pay

## 2019-12-11 ENCOUNTER — Ambulatory Visit: Payer: BLUE CROSS/BLUE SHIELD | Admitting: Cardiology

## 2019-12-11 DIAGNOSIS — Z122 Encounter for screening for malignant neoplasm of respiratory organs: Secondary | ICD-10-CM | POA: Insufficient documentation

## 2019-12-11 DIAGNOSIS — F17200 Nicotine dependence, unspecified, uncomplicated: Secondary | ICD-10-CM | POA: Diagnosis present

## 2019-12-11 DIAGNOSIS — F17210 Nicotine dependence, cigarettes, uncomplicated: Secondary | ICD-10-CM

## 2019-12-11 DIAGNOSIS — Z87891 Personal history of nicotine dependence: Secondary | ICD-10-CM | POA: Insufficient documentation

## 2019-12-12 ENCOUNTER — Other Ambulatory Visit: Payer: Self-pay | Admitting: *Deleted

## 2019-12-12 DIAGNOSIS — F17210 Nicotine dependence, cigarettes, uncomplicated: Secondary | ICD-10-CM

## 2019-12-12 NOTE — Progress Notes (Signed)

## 2019-12-12 NOTE — Addendum Note (Signed)
Addended by: Dorris Fetch on: 12/12/2019 02:56 PM   Modules accepted: Orders

## 2019-12-12 NOTE — Progress Notes (Signed)
This encounter was created in error - please disregard.

## 2020-01-14 ENCOUNTER — Ambulatory Visit
Admission: EM | Admit: 2020-01-14 | Discharge: 2020-01-14 | Disposition: A | Discharge status: Home / Self Care | Payer: BLUE CROSS/BLUE SHIELD | Attending: Emergency Medicine | Admitting: Emergency Medicine

## 2020-01-14 ENCOUNTER — Other Ambulatory Visit: Payer: Self-pay

## 2020-01-14 DIAGNOSIS — J209 Acute bronchitis, unspecified: Secondary | ICD-10-CM | POA: Diagnosis not present

## 2020-01-14 MED ORDER — AZITHROMYCIN 250 MG PO TABS: 250.0000 mg | ORAL_TABLET | Freq: Every day | ORAL | 0 refills | Status: DC

## 2020-01-14 MED ORDER — FLUTICASONE PROPIONATE 50 MCG/ACT NA SUSP: 1.0000 | Freq: Every day | NASAL | 0 refills | Status: DC

## 2020-01-14 MED ORDER — BENZONATATE 100 MG PO CAPS: 100.0000 mg | ORAL_CAPSULE | Freq: Three times a day (TID) | ORAL | 0 refills | Status: DC

## 2020-01-14 NOTE — Discharge Instructions (Addendum)
Get plenty of rest and push fluids Tessalon Perles prescribed as needed for symptomatic relief Flonase was prescribed Azithromycin was prescribed / take as directed Use OTC medication as needed for symptomatic relief Follow up with PCP if symptoms persist Return or go to ER if you have any new or worsening symptoms

## 2020-01-14 NOTE — ED Triage Notes (Addendum)
Pt presents to UC w/ c/o nasal congestion/drainage and some cough at night while sleeping from nasal drainage x 1 week. no other symptoms

## 2020-01-14 NOTE — ED Provider Notes (Signed)
[facility]    CSN: [id number] Arrival date & time: 01/14/20  1011      History   Chief Complaint Chief Complaint  Patient presents with  . Nasal Congestion    HPI Johnny Foster is a 64 y.o. male.   Who presented to the urgent care for complaint of nasal congestion, cough and white greenish mucus for the past 1week.  Denies sick exposure to COVID, flu or strep.  Denies recent travel.  Denies aggravating or alleviating symptoms.  Denies previous COVID infection.   Denies fever, chills, fatigue,  rhinorrhea, sore throat,  SOB, wheezing, chest pain, nausea, vomiting, changes in bowel or bladder habits.       Past Medical History:  Diagnosis Date  . Hepatitis C    Treated  . Iron deficiency anemia 10/02/2018  . Myocardial infarction [facility])    May 2019.  LAD moderate 60 to 80% stenosis.  Circumflex OM occluded.  RCA 80% stenosis.  OM was thought to be a acute infarct vessel.  It was treated with a Synergy stent.  This is a 2.5 x 20 mm, RCA was treated with a 3.  3.0 by 16 mm Synergy.  Elective PCI in June of an LAD lesion with 3.0 x 38 mm Synergy.  . 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 Absolute anemia 10/11/2018  . Iron deficiency anemia 10/02/2018  . Encounter for long-term (current) use of medications 09/19/2018  . Tobacco abuse 09/19/2018  . ASCVD (arteriosclerotic cardiovascular disease) 09/09/2018  . Essential hypertension 09/09/2018  . Mixed hyperlipidemia 09/09/2018    Past Surgical History:  Procedure Laterality Date  . TONSILECTOMY, ADENOIDECTOMY, BILATERAL MYRINGOTOMY AND TUBES  1969       Home Medications    Prior to Admission medications   Medication Sig Start Date End Date Taking? Authorizing Provider  acetaminophen (TYLENOL) 500 MG tablet Take 500 mg by mouth daily as needed for moderate pain.    [provider]  aspirin (ASPIRIN LOW DOSE) 81 MG  EC tablet Take 1 tablet (81 mg total) by mouth daily. Swallow whole. 10/03/19   Mechele Claude, MD  atorvastatin (LIPITOR) 80 MG tablet TAKE 1 TABLET(80 MG) BY MOUTH DAILY 09/02/19   Mechele Claude, MD  azithromycin (ZITHROMAX) 250 MG tablet Take 1 tablet (250 mg total) by mouth daily. Take first 2 tablets together, then 1 every day until finished. 01/14/20   Azelyn Batie, Zachery Dakins, FNP  benzonatate (TESSALON) 100 MG capsule Take 1 capsule (100 mg total) by mouth every 8 (eight) hours. 01/14/20   Mahkayla Preece, Zachery Dakins, FNP  BRILINTA 90 MG TABS tablet Take 90 mg by mouth 2 (two) times daily.  12/06/18   [provider]  clotrimazole-betamethasone (LOTRISONE) cream Apply 1 application topically 2 (two) times daily. To affected areas until rash clears 10/03/19   Mechele Claude, MD  Ferrous Fumarate (HEMOCYTE - 106 MG FE) 324 (106 Fe) MG TABS tablet Take 1 tablet (106 mg of iron total) by mouth 2 (two) times daily. Patient not taking: Reported on 12/09/2019 06/06/19   Mechele Claude, MD  ferrous sulfate 324 MG TBEC Take 1 tablet (324 mg total) by mouth 2 (two) times a day. 06/06/19   Mechele Claude, MD  fluticasone (FLONASE) 50 MCG/ACT nasal spray Place 1 spray into both nostrils daily for 14 days. 01/14/20 01/28/20  Leldon Steege, Zachery Dakins, FNP  lisinopril (ZESTRIL)  5 MG tablet TAKE 1 TABLET(5 MG) BY MOUTH DAILY 09/02/19   Mechele Claude, MD  metoprolol tartrate (LOPRESSOR) 25 MG tablet TAKE 1 TABLET(25 MG) BY MOUTH TWICE DAILY 09/02/19   Mechele Claude, MD  nitroGLYCERIN (NITROSTAT) 0.4 MG SL tablet Place 1 tablet (0.4 mg total) under the tongue every 5 (five) minutes as needed for chest pain. 09/19/18   Rollene Rotunda, MD    Family History Family History  Problem Relation Age of Onset  . Arthritis Mother   . CAD Mother 17  . Lung disease Father   . Cancer Brother     Social History Social History   Tobacco Use  . Smoking status: Current Every Day Smoker    Packs/day: 1.00    Years: 40.00    Pack  years: 40.00    Types: Cigarettes  . Smokeless tobacco: Never Used  Substance Use Topics  . Alcohol use: Yes    Comment: rarely  . Drug use: Not Currently    Types: Other-see comments    Comment: history of Heroin and Cocaine use in his teens      Allergies   Patient has no known allergies.   Review of Systems Review of Systems  Constitutional: Negative.   HENT: Positive for congestion.   Respiratory: Positive for cough.   Cardiovascular: Negative.   Gastrointestinal: Negative.   Neurological: Negative.   All other systems reviewed and are negative.    Physical Exam Triage Vital Signs ED Triage Vitals  Enc Vitals Group     BP 01/14/20 1019 132/86     Pulse Rate 01/14/20 1019 62     Resp 01/14/20 1019 16     Temp 01/14/20 1019 98 F (36.7 C)     Temp Source 01/14/20 1019 Oral     SpO2 01/14/20 1019 98 %     Weight --      Height --      Head Circumference --      Peak Flow --      Pain Score 01/14/20 1034 0     Pain Loc --      Pain Edu? --      Excl. in GC? --    No data found.  Updated Vital Signs BP 132/86 (BP Location: Right Arm)   Pulse 62   Temp 98 F (36.7 C) (Oral)   Resp 16   SpO2 98%   Visual Acuity Right Eye Distance:   Left Eye Distance:   Bilateral Distance:    Right Eye Near:   Left Eye Near:    Bilateral Near:     Physical Exam Vitals and nursing note reviewed.  Constitutional:      General: He is not in acute distress.    Appearance: Normal appearance. He is normal weight. He is not ill-appearing or toxic-appearing.  HENT:     Head: Normocephalic.     Right Ear: Tympanic membrane, ear canal and external ear normal. There is no impacted cerumen.     Left Ear: Tympanic membrane, ear canal and external ear normal. There is no impacted cerumen.     Nose: Nose normal. No congestion.     Mouth/Throat:     Mouth: Mucous membranes are moist.     Pharynx: Oropharynx is clear. No oropharyngeal exudate.  Cardiovascular:     Rate and  Rhythm: Normal rate and regular rhythm.     Pulses: Normal pulses.     Heart sounds: Normal heart sounds. No murmur.  Pulmonary:     Effort: Pulmonary effort is normal. No respiratory distress.     Breath sounds: Normal breath sounds. No wheezing or rhonchi.  Chest:     Chest wall: No tenderness.  Skin:    Capillary Refill: Capillary refill takes less than 2 seconds.  Neurological:     General: No focal deficit present.     Mental Status: He is alert and oriented to person, place, and time.      UC Treatments / Results  Labs (all labs ordered are listed, but only abnormal results are displayed) Labs Reviewed - No data to display  EKG   Radiology No results found.  Procedures Procedures (including critical care time)  Medications Ordered in UC Medications - No data to display  Initial Impression / Assessment and Plan / UC Course  I have reviewed the triage vital signs and the nursing notes.  Pertinent labs & imaging results that were available during my care of the patient were reviewed by me and considered in my medical decision making (see chart for details).   Patient stable on discharge and will be treated for possible bronchitis Azithromycin will be prescribed Flonase will be prescribed Tessalon Perles was prescribed Advised patient to take medication as directed Follow-up with primary care or return for worsening symptoms  Final Clinical Impressions(s) / UC Diagnoses   Final diagnoses:  Acute bronchitis, unspecified organism     Discharge Instructions     Get plenty of rest and push fluids Tessalon Perles prescribed as needed for symptomatic relief Flonase was prescribed Azithromycin was prescribed / take as directed Use OTC medication as needed for symptomatic relief Follow up with PCP if symptoms persist Return or go to ER if you have any new or worsening symptoms      ED Prescriptions    Medication Sig Dispense Auth. Provider   benzonatate  (TESSALON) 100 MG capsule Take 1 capsule (100 mg total) by mouth every 8 (eight) hours. 21 capsule Aswad Wandrey S, FNP   fluticasone (FLONASE) 50 MCG/ACT nasal spray Place 1 spray into both nostrils daily for 14 days. 16 g Conner Neiss, Darrelyn Hillock, FNP   azithromycin (ZITHROMAX) 250 MG tablet Take 1 tablet (250 mg total) by mouth daily. Take first 2 tablets together, then 1 every day until finished. 6 tablet Egbert Seidel, Darrelyn Hillock, FNP     PDMP not reviewed this encounter.   Emerson Monte, [place] 01/14/20 1052

## 2020-01-30 IMAGING — CT CT CHEST LUNG CANCER SCREENING LOW DOSE W/O CM
2 of 4 series · 15 of 40 positions shown, 18 images · non-contrast
Comparison: None.

CLINICAL DATA: 62-year-old male current smoker with 40 pack-year
history of smoking. Lung cancer screening examination.

EXAM:
CT CHEST WITHOUT CONTRAST LOW-DOSE FOR LUNG CANCER SCREENING
TECHNIQUE: Multidetector CT imaging of the chest was performed following the
standard protocol without IV contrast.

[Series 2: thorax 5.0 i31f 3 · axial · 0.76mm/px · z∈[-331,-41]mm · 12 of 70 slices shown, 15 images]
[im 6/70  mediastinal]
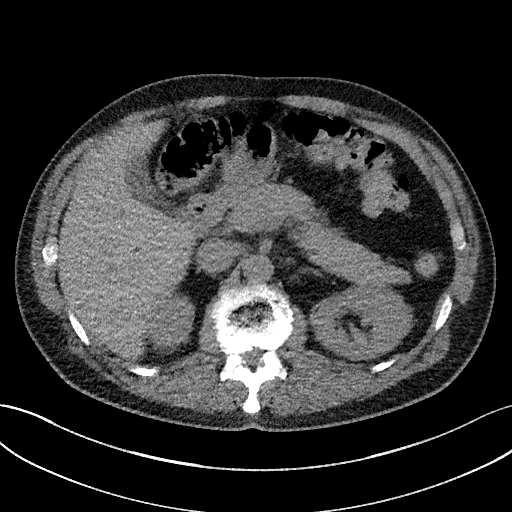
[im 6/70  lung]
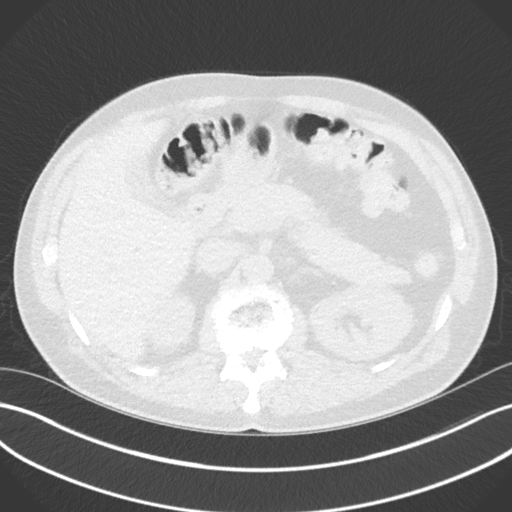
[im 11/70  lung]
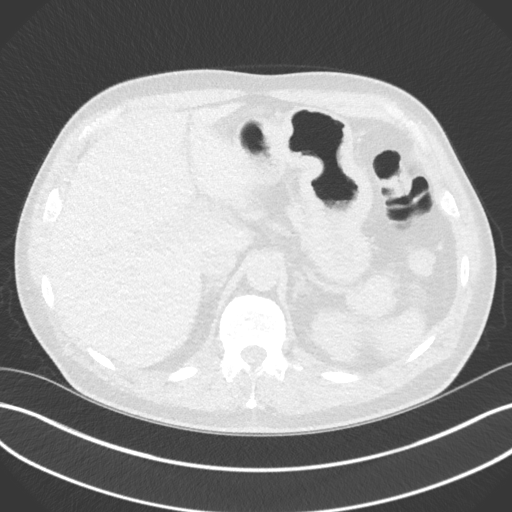
[im 16/70  lung]
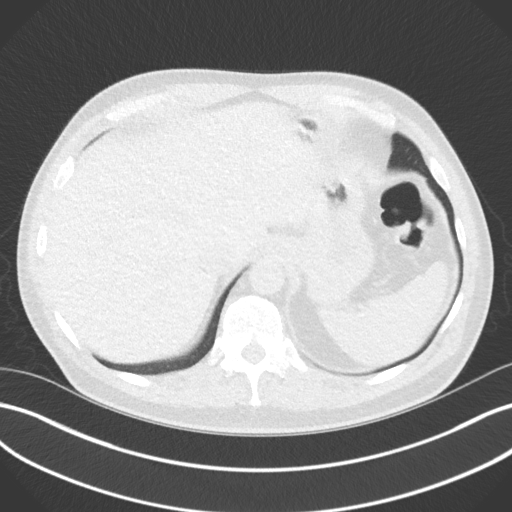
[im 22/70  lung]
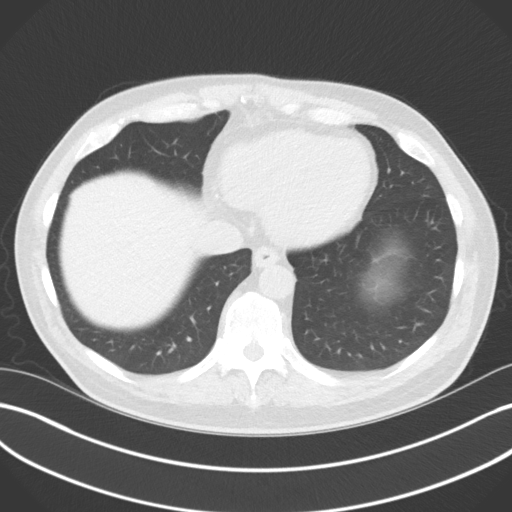
[im 27/70  mediastinal]
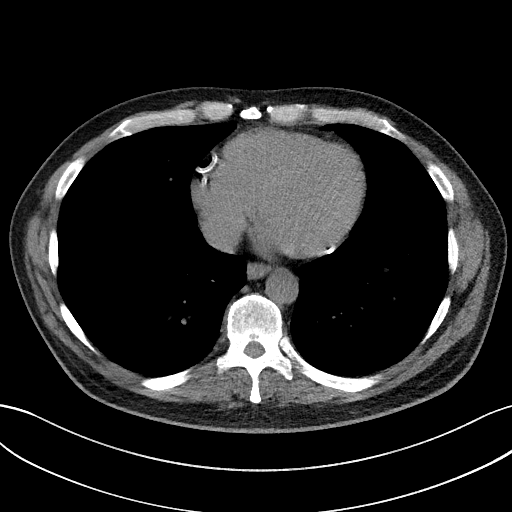
[im 27/70  lung]
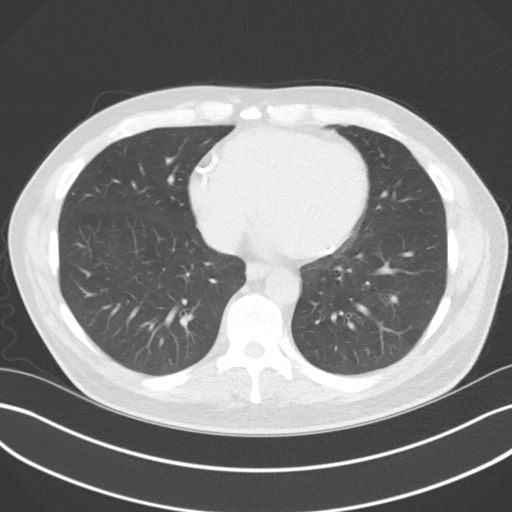
[im 32/70  lung]
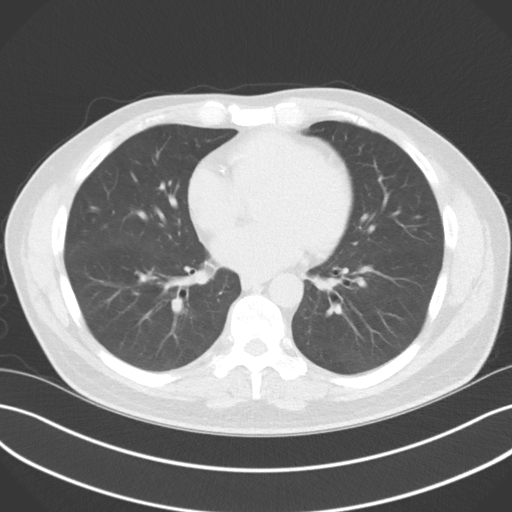
[im 38/70  lung]
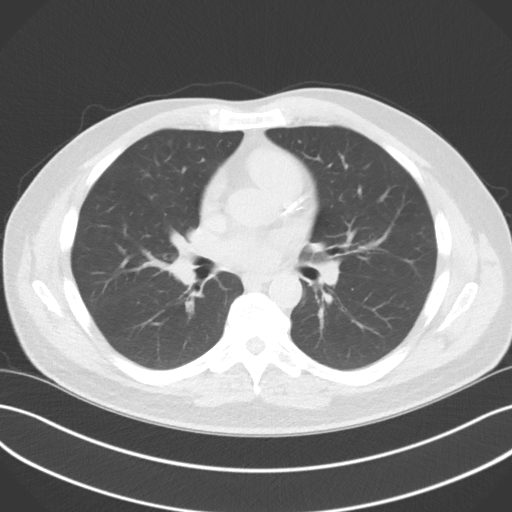
[im 43/70  lung]
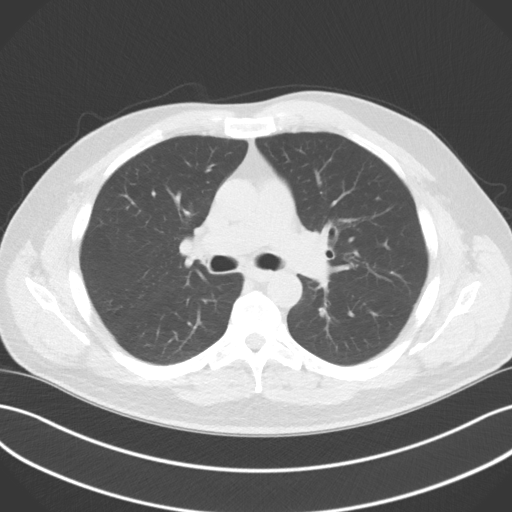
[im 48/70  mediastinal]
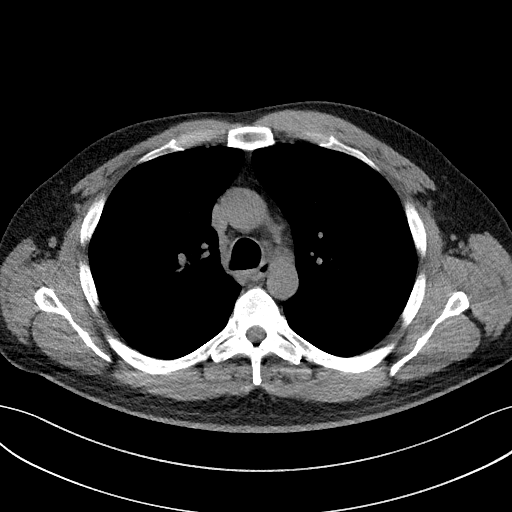
[im 48/70  lung]
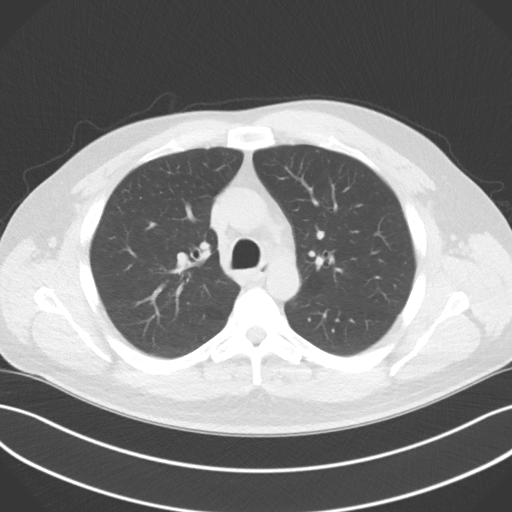
[im 54/70  lung]
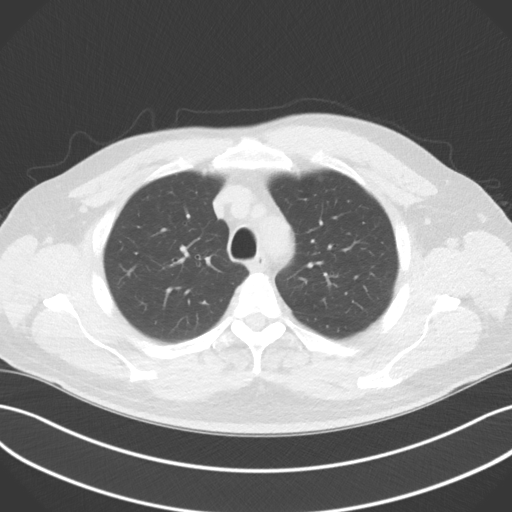
[im 59/70  lung]
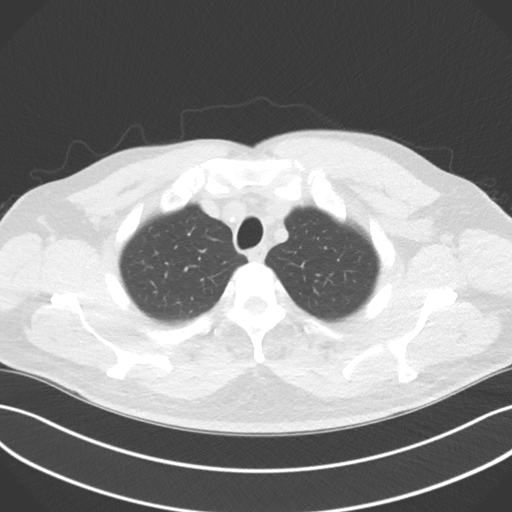
[im 64/70  lung]
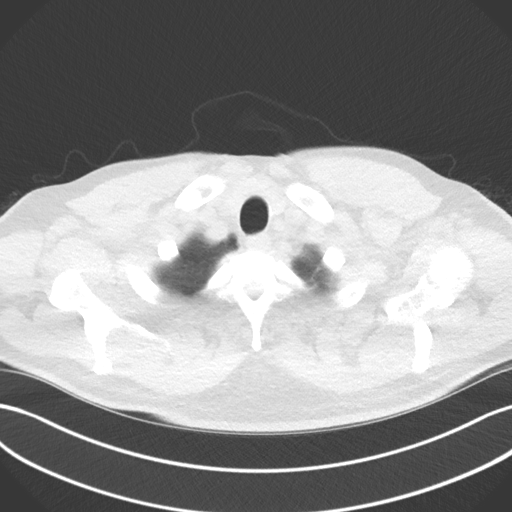

[Series 5: coronal · coronal · 0.70mm/px · 3 of 118 slices shown]
[im 24/118  lung]
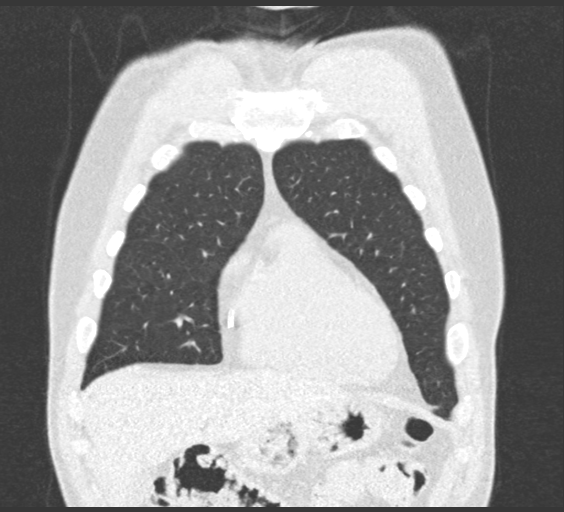
[im 47/118  lung]
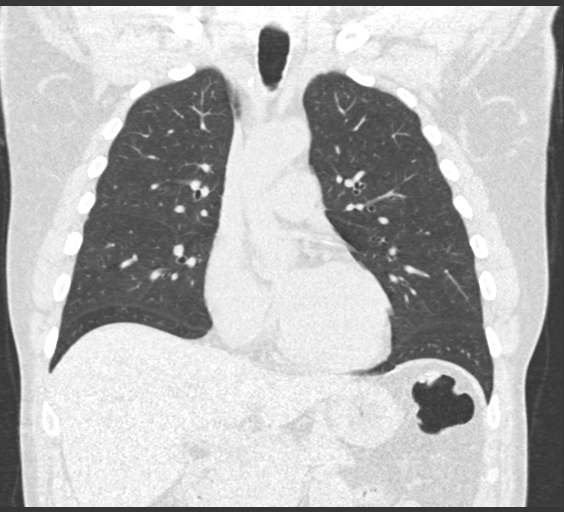
[im 71/118  lung]
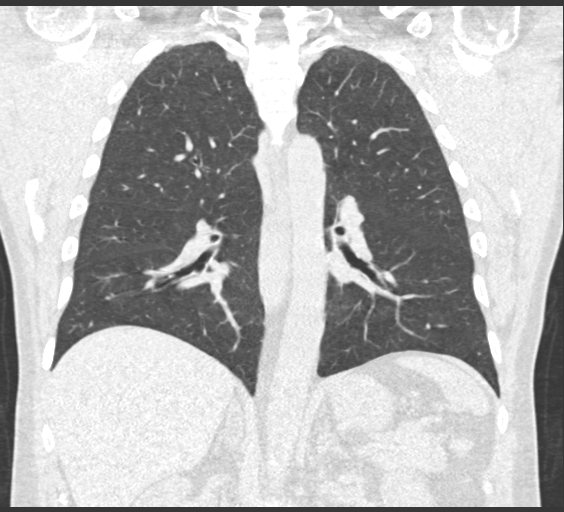

[15 of 40 positions shown; findings below may reference images not displayed]

FINDINGS: Cardiovascular: Heart size is normal. There is no significant
pericardial fluid, thickening or pericardial calcification. There is
aortic atherosclerosis, as well as atherosclerosis of the great
vessels of the mediastinum and the coronary arteries, including
calcified atherosclerotic plaque in the left main, left anterior
descending, left circumflex and right coronary arteries.

Mediastinum/Nodes: No pathologically enlarged mediastinal or hilar
lymph nodes. Please note that accurate exclusion of hilar adenopathy
is limited on noncontrast CT scans. Esophagus is unremarkable in
appearance. No axillary lymphadenopathy.

Lungs/Pleura: Multiple small pulmonary nodules are noted throughout
the lungs bilaterally, largest of which is in the periphery of the
right middle lobe near the minor fissure (axial image 149 of series
3), with a volume derived mean diameter of only 2.4 mm. No larger
more suspicious appearing pulmonary nodules or masses are noted. No
acute consolidative airspace disease. No pleural effusions. Mild
diffuse bronchial wall thickening with mild centrilobular and
paraseptal emphysema.

Upper Abdomen: Unremarkable.

Musculoskeletal: There are no aggressive appearing lytic or blastic
lesions noted in the visualized portions of the skeleton.
IMPRESSION: 1. Lung-RADS 2S, benign appearance or behavior. Continue annual
screening with low-dose chest CT without contrast in 12 months.
2. The "S" modifier above refers to potentially clinically
significant non lung cancer related findings. Specifically, there is
aortic atherosclerosis, in addition to left main and 3 vessel
coronary artery disease. Please note that although the presence of
coronary artery calcium documents the presence of coronary artery
disease, the severity of this disease and any potential stenosis
cannot be assessed on this non-gated CT examination. Assessment for
potential risk factor modification, dietary therapy or pharmacologic
therapy may be warranted, if clinically indicated.
3. Mild diffuse bronchial wall thickening with mild centrilobular
and paraseptal emphysema; imaging findings suggestive of underlying
COPD.

Aortic Atherosclerosis (N60YN-N2D.D) and Emphysema (N60YN-0F5.S).

## 2020-04-22 ENCOUNTER — Other Ambulatory Visit: Payer: Self-pay

## 2020-04-22 MED ORDER — NITROGLYCERIN 0.4 MG SL SUBL: 0.4000 mg | SUBLINGUAL_TABLET | SUBLINGUAL | 99 refills | Status: DC | PRN

## 2020-05-14 ENCOUNTER — Ambulatory Visit: Payer: BLUE CROSS/BLUE SHIELD | Admitting: Family Medicine

## 2020-05-14 ENCOUNTER — Encounter: Payer: Self-pay | Admitting: Family Medicine

## 2020-05-14 ENCOUNTER — Other Ambulatory Visit: Payer: Self-pay

## 2020-05-14 VITALS — BP 127/76 | HR 64 | Temp 97.6°F | Resp 20 | Ht 70.0 in | Wt 197.0 lb

## 2020-05-14 DIAGNOSIS — M79672 Pain in left foot: Secondary | ICD-10-CM

## 2020-05-14 DIAGNOSIS — S51811A Laceration without foreign body of right forearm, initial encounter: Secondary | ICD-10-CM | POA: Diagnosis not present

## 2020-05-14 DIAGNOSIS — M79671 Pain in right foot: Secondary | ICD-10-CM

## 2020-05-17 ENCOUNTER — Encounter: Payer: Self-pay | Admitting: Family Medicine

## 2020-05-17 NOTE — Progress Notes (Signed)
Chief Complaint  Patient presents with  . Suture / Staple Removal    HPI  Patient presents today for removal of stitches from the right forearm.  These were placed as result of injury about 10 days ago.  A branch from a tree sprung back lacerating his forearm.  He was in the emergency room 5 sutures were placed.  He has no complaints about lack of healing signs of infection etc.  Patient is concerned about foot pain.  He is concerned for an ingrown toenail.  He is interested in referral to podiatry.  PMH: Smoking status noted ROS: Per HPI  Objective: BP 127/76   Pulse 64   Temp 97.6 F (36.4 C) (Temporal)   Resp 20   Ht 5\' 10"  (1.778 m)   Wt 197 lb (89.4 kg)   SpO2 99%   BMI 28.27 kg/m  Gen: NAD, alert, cooperative with exam HEENT: NCAT, EOMI, PERRL CV: RRR, good S1/S2, no murmur Resp: CTABL, no wheezes, non-labored Skin: The right upper extremity shows a 4 cm laceration with 5 sutures in place.  These are simple interrupted of apparent 4-0 Ethilon.  There is mild eschar formation along the length of this wound.  The sutures were removed without difficulty.  Wound care was reviewed.  Forearm neuro: Alert and oriented, No gross deficits  Foot exam was negative for any signs of ingrown toenail.  There was some early signs of onychomycosis.  Assessment and plan:  1. Foot pain, bilateral   2. Laceration of right forearm, initial encounter     Sutures were removed.  Wound care reviewed.  He should keep it covered and wash it daily with warm soapy water apply Neosporin and gauze.  Orders Placed This Encounter  Procedures  . Ambulatory referral to Podiatry    Referral Priority:   Routine    Referral Type:   Consultation    Referral Reason:   Specialty Services Required    Requested Specialty:   Podiatry    Number of Visits Requested:   1    Follow up as needed.  , MD

## 2020-05-25 DIAGNOSIS — E785 Hyperlipidemia, unspecified: Secondary | ICD-10-CM | POA: Insufficient documentation

## 2020-05-25 DIAGNOSIS — Z7189 Other specified counseling: Secondary | ICD-10-CM | POA: Insufficient documentation

## 2020-05-25 NOTE — Progress Notes (Signed)
Cardiology Office Note   Date:  05/27/2020   ID:  Johnny Foster, DOB Feb 12, 1956, MRN [id number]  PCP:  Mechele Claude, MD  Cardiologist:   Rollene Rotunda, MD     Chief Complaint  Patient presents with  . Coronary Artery Disease      History of Present Illness: Johnny Foster is a 64 y.o. male who presents for follow up of CAD  He had a cardiac cath in June 2019 in [place] after presenting with a acute inferolateral MI.  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  EF was normal.     Since I last saw him he has done well.  The patient denies any new symptoms such as chest discomfort, neck or arm discomfort. There has been no new shortness of breath, PND or orthopnea. There have been no reported palpitations, presyncope or syncope.  He is not exercising as much as I would like when he is done a little swimming denies any cardiovascular symptoms.   Past Medical History:  Diagnosis Date  . Hepatitis C    Treated  . Iron deficiency anemia 10/02/2018  . Myocardial infarction [facility])    May 2019.  LAD moderate 60 to 80% stenosis.  Circumflex OM occluded.  RCA 80% stenosis.  OM was thought to be a acute infarct vessel.  It was treated with a Synergy stent.  This is a 2.5 x 20 mm, RCA was treated with a 3.  3.0 by 16 mm Synergy.  Elective PCI in June of an LAD lesion with 3.0 x 38 mm Synergy.  . 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 TONSILECTOMY, ADENOIDECTOMY, BILATERAL MYRINGOTOMY AND TUBES  1969     Current Outpatient Medications  Medication Sig Dispense Refill  . acetaminophen (TYLENOL) 500 MG tablet Take 500 mg by mouth daily as needed for moderate pain.    Marland Kitchen aspirin (ASPIRIN LOW DOSE) 81 MG EC tablet Take 1 tablet (81 mg total) by mouth daily. Swallow whole. 90 tablet 3  . atorvastatin (LIPITOR) 80 MG tablet TAKE 1  TABLET(80 MG) BY MOUTH DAILY 90 tablet 0  . clotrimazole-betamethasone (LOTRISONE) cream Apply 1 application topically 2 (two) times daily. To affected areas until rash clears 45 g 5  . lisinopril (ZESTRIL) 5 MG tablet TAKE 1 TABLET(5 MG) BY MOUTH DAILY 90 tablet 0  . metoprolol tartrate (LOPRESSOR) 25 MG tablet TAKE 1 TABLET(25 MG) BY MOUTH TWICE DAILY 180 tablet 0  . nitroGLYCERIN (NITROSTAT) 0.4 MG SL tablet Place 1 tablet (0.4 mg total) under the tongue every 5 (five) minutes as needed for chest pain. 25 tablet prn   No current facility-administered medications for this visit.    Allergies:   Patient has no known allergies.    ROS:  Please see the history of present illness.   Otherwise, review of systems are positive for none.   All other systems are reviewed and negative.    PHYSICAL EXAM: VS:  BP (!) 158/88   Pulse 68   Ht 5\' 10"  (1.778 m)   Wt 195 lb (88.5 kg)   BMI 27.98 kg/m  , BMI Body mass index is 27.98 kg/m. GENERAL:  Well appearing NECK:  No jugular venous distention, waveform within normal limits, carotid upstroke brisk and symmetric, no bruits, no thyromegaly LUNGS:  Clear to auscultation bilaterally CHEST:  Unremarkable HEART:  PMI not displaced or sustained,S1 and S2 within normal limits, no S3, no S4, no clicks, no rubs, no murmurs ABD:  Flat, positive bowel sounds normal in frequency in pitch, no bruits, no rebound, no guarding, no midline pulsatile mass, no hepatomegaly, no splenomegaly EXT:  2 plus pulses throughout, no edema, no cyanosis no clubbing   EKG:  EKG is not ordered today. The ekg ordered 12/09/19 demonstrates sinus rhythm, rate 57, axis within normal limits, intervals within normal limits, early repolarization changes unchanged from previous.   Recent Labs: 10/03/2019: ALT 14 12/07/2019: BUN 16; Creatinine, Ser 0.84; Hemoglobin 16.4; Platelets 241; Potassium 4.0; Sodium 140    Lipid Panel    Component Value Date/Time   CHOL 125 10/03/2019  0948   TRIG 76 10/03/2019 0948   HDL 60 10/03/2019 0948   CHOLHDL 2.1 10/03/2019 0948   LDLCALC 50 10/03/2019 0948      Wt Readings from Last 3 Encounters:  05/27/20 195 lb (88.5 kg)  05/14/20 197 lb (89.4 kg)  12/09/19 191 lb (86.6 kg)      Other studies Reviewed: Additional studies/ records that were reviewed today include: None Review of the above records demonstrates:  Please see elsewhere in the note.     ASSESSMENT AND PLAN:  CAD:The patient has no new sypmtoms.  No further cardiovascular testing is indicated.  We will continue with aggressive risk reduction and meds as listed.  TOBACCO ABUSE :    He is "working on stopping smoking."   We talked again about the need to stop smoking and he says "I am working on it.  DYSLIPIDEMIA:    LDL was 45 last year.  I will set him up for repeat labs.   COVID 19:   He had his vaccination.  Current medicines are reviewed at length with the patient today.  The patient does not have concerns regarding medicines.  The following changes have been made:  None  Labs/ tests ordered today include:   Orders Placed This Encounter  Procedures  . Hepatic function panel  . Lipid panel     Disposition:   FU with in 12  months.     Signed, Rollene Rotunda, MD  05/27/2020 9:55 AM    [facility]

## 2020-05-26 ENCOUNTER — Telehealth: Payer: Self-pay | Admitting: Family Medicine

## 2020-05-26 NOTE — Telephone Encounter (Signed)
Pt states he will come by the office tomorrow around 10:20am. Wanting to discuss his referral for podiatry.

## 2020-05-27 ENCOUNTER — Other Ambulatory Visit: Payer: Self-pay

## 2020-05-27 ENCOUNTER — Ambulatory Visit: Payer: BLUE CROSS/BLUE SHIELD | Admitting: Cardiology

## 2020-05-27 ENCOUNTER — Encounter: Payer: Self-pay | Admitting: Cardiology

## 2020-05-27 VITALS — BP 158/88 | HR 68 | Ht 70.0 in | Wt 195.0 lb

## 2020-05-27 DIAGNOSIS — E785 Hyperlipidemia, unspecified: Secondary | ICD-10-CM

## 2020-05-27 DIAGNOSIS — Z720 Tobacco use: Secondary | ICD-10-CM

## 2020-05-27 DIAGNOSIS — I2510 Atherosclerotic heart disease of native coronary artery without angina pectoris: Secondary | ICD-10-CM

## 2020-05-27 DIAGNOSIS — Z79899 Other long term (current) drug therapy: Secondary | ICD-10-CM

## 2020-05-27 DIAGNOSIS — Z7189 Other specified counseling: Secondary | ICD-10-CM

## 2020-05-27 NOTE — Patient Instructions (Signed)
Medication Instructions:  The current medical regimen is effective;  continue present plan and medications.  *If you need a refill on your cardiac medications before your next appointment, please call your pharmacy*  Lab Work: Please have fasting blood work at [facility] (Lipid,hepatic panel)  If you have labs (blood work) drawn today and your tests are completely normal, you will receive your results only by: Marland Kitchen MyChart Message (if you have MyChart) OR . A paper copy in the mail If you have any lab test that is abnormal or we need to change your treatment, we will call you to review the results.  Follow-Up: At [facility], you and your health needs are our priority.  As part of our continuing mission to provide you with exceptional heart care, we have created designated Provider Care Teams.  These Care Teams include your primary Cardiologist (physician) and Advanced Practice Providers (APPs -  Physician Assistants and Nurse Practitioners) who all work together to provide you with the care you need, when you need it.  We recommend signing up for the patient portal called "MyChart".  Sign up information is provided on this After Visit Summary.  MyChart is used to connect with patients for Virtual Visits (Telemedicine).  Patients are able to view lab/test results, encounter notes, upcoming appointments, etc.  Non-urgent messages can be sent to your provider as well.   To learn more about what you can do with MyChart, go to [email or web address].    Your next appointment:   12 month(s)  The format for your next appointment:   In Person  Provider:   Rollene Rotunda, MD   BP goal 130/85  Thank you for choosing [facility]!!

## 2020-05-28 ENCOUNTER — Other Ambulatory Visit: Payer: BLUE CROSS/BLUE SHIELD

## 2020-05-28 DIAGNOSIS — E785 Hyperlipidemia, unspecified: Secondary | ICD-10-CM

## 2020-05-28 DIAGNOSIS — Z79899 Other long term (current) drug therapy: Secondary | ICD-10-CM

## 2020-05-28 NOTE — Telephone Encounter (Signed)
Spoke with Patient today in office - Patient is aware his Ref was sent to Dr. Darden Dates Office and Patient was given telephone number to call and make appt.

## 2020-05-29 LAB — HEPATIC FUNCTION PANEL
ALT: 18 IU/L (ref 0–44)
AST: 18 IU/L (ref 0–40)
Albumin: 4.6 g/dL (ref 3.8–4.8)
Alkaline Phosphatase: 76 IU/L (ref 48–121)
Bilirubin Total: 0.4 mg/dL (ref 0.0–1.2)
Bilirubin, Direct: 0.14 mg/dL (ref 0.00–0.40)
Total Protein: 6.8 g/dL (ref 6.0–8.5)

## 2020-05-29 LAB — LIPID PANEL
Chol/HDL Ratio: 2.1 ratio (ref 0.0–5.0)
Cholesterol, Total: 142 mg/dL (ref 100–199)
HDL: 69 mg/dL (ref >39)
LDL Chol Calc (NIH): 61 mg/dL (ref 0–99)
Triglycerides: 56 mg/dL (ref 0–149)
VLDL Cholesterol Cal: 12 mg/dL (ref 5–40)

## 2020-06-03 ENCOUNTER — Other Ambulatory Visit: Payer: Self-pay | Admitting: Family Medicine

## 2020-08-24 ENCOUNTER — Other Ambulatory Visit: Payer: Self-pay

## 2020-08-24 ENCOUNTER — Ambulatory Visit (INDEPENDENT_AMBULATORY_CARE_PROVIDER_SITE_OTHER): Payer: BLUE CROSS/BLUE SHIELD | Admitting: Family Medicine

## 2020-08-24 ENCOUNTER — Encounter: Payer: Self-pay | Admitting: Family Medicine

## 2020-08-24 VITALS — BP 138/82 | HR 73 | Temp 97.8°F | Resp 20 | Ht 70.0 in | Wt 198.0 lb

## 2020-08-24 DIAGNOSIS — I10 Essential (primary) hypertension: Secondary | ICD-10-CM | POA: Diagnosis not present

## 2020-08-24 DIAGNOSIS — Z23 Encounter for immunization: Secondary | ICD-10-CM | POA: Diagnosis not present

## 2020-08-24 DIAGNOSIS — E782 Mixed hyperlipidemia: Secondary | ICD-10-CM

## 2020-08-24 DIAGNOSIS — D508 Other iron deficiency anemias: Secondary | ICD-10-CM | POA: Diagnosis not present

## 2020-08-24 DIAGNOSIS — Z720 Tobacco use: Secondary | ICD-10-CM

## 2020-08-24 DIAGNOSIS — I2510 Atherosclerotic heart disease of native coronary artery without angina pectoris: Secondary | ICD-10-CM | POA: Diagnosis not present

## 2020-08-24 MED ORDER — ATORVASTATIN CALCIUM 80 MG PO TABS: ORAL_TABLET | ORAL | 1 refills | Status: DC

## 2020-08-24 MED ORDER — LISINOPRIL 5 MG PO TABS
ORAL_TABLET | ORAL | 3 refills | Status: DC
Start: 2020-08-24 — End: 2021-09-03

## 2020-08-24 MED ORDER — METOPROLOL TARTRATE 25 MG PO TABS: ORAL_TABLET | ORAL | 0 refills | Status: DC

## 2020-08-24 MED ORDER — LISINOPRIL 5 MG PO TABS: ORAL_TABLET | ORAL | 1 refills | Status: DC

## 2020-08-24 MED ORDER — NITROGLYCERIN 0.4 MG SL SUBL: 0.4000 mg | SUBLINGUAL_TABLET | SUBLINGUAL | 99 refills | Status: DC | PRN

## 2020-08-24 MED ORDER — ASPIRIN 81 MG PO TBEC: 81.0000 mg | DELAYED_RELEASE_TABLET | Freq: Every day | ORAL | 3 refills | Status: DC

## 2020-08-24 MED ORDER — ATORVASTATIN CALCIUM 80 MG PO TABS: ORAL_TABLET | ORAL | 3 refills | Status: DC

## 2020-08-24 MED ORDER — METOPROLOL TARTRATE 25 MG PO TABS
ORAL_TABLET | ORAL | 3 refills | Status: DC
Start: 2020-08-24 — End: 2020-12-02

## 2020-08-24 NOTE — Progress Notes (Signed)
Subjective:  Patient ID: Johnny Foster, male    DOB: [birth year]  Age: 64 y.o. MRN: [id number]  CC: Medical Management of Chronic Issues   HPI Johnny Foster presents for  follow-up of hypertension. Patient has no history of headache chest pain or shortness of breath or recent cough. Patient also denies symptoms of TIA such as focal numbness or weakness. Patient denies side effects from medication. States taking it regularly.   History Johnny Foster has a past medical history of Hepatitis C, Iron deficiency anemia (10/02/2018), Myocardial infarction [facility]), and Substance abuse (HCC).   He has a past surgical history that includes Tonsilectomy, adenoidectomy, bilateral myringotomy and tubes (1969).   His family history includes Arthritis in his mother; CAD (age of onset: 66) in his mother; Cancer in his brother; Lung disease in his father.He reports that he has been smoking cigarettes. He has a 40.00 pack-year smoking history. He has never used smokeless tobacco. He reports current alcohol use. He reports previous drug use. Drug: Other-see comments.  Current Outpatient Medications on File Prior to Visit  Medication Sig Dispense Refill  . acetaminophen (TYLENOL) 500 MG tablet Take 500 mg by mouth daily as needed for moderate pain.    . clotrimazole-betamethasone (LOTRISONE) cream Apply 1 application topically 2 (two) times daily. To affected areas until rash clears (Patient not taking: Reported on 08/24/2020) 45 g 5   No current facility-administered medications on file prior to visit.    ROS Review of Systems  Constitutional: Negative for fever.  Respiratory: Negative for shortness of breath.   Cardiovascular: Negative for chest pain.  Musculoskeletal: Negative for arthralgias.  Skin: Negative for rash.    Objective:  BP 138/82   Pulse 73   Temp 97.8 F (36.6 C) (Temporal)   Resp 20   Ht 5\' 10"  (1.778 m)   Wt 198 lb (89.8 kg)   SpO2 98%   BMI 28.41 kg/m   BP Readings from Last  3 Encounters:  08/24/20 138/82  05/27/20 (!) 158/88  05/14/20 127/76    Wt Readings from Last 3 Encounters:  08/24/20 198 lb (89.8 kg)  05/27/20 195 lb (88.5 kg)  05/14/20 197 lb (89.4 kg)     Physical Exam Vitals reviewed.  Constitutional:      Appearance: He is well-developed.  HENT:     Head: Normocephalic and atraumatic.     Right Ear: External ear normal.     Left Ear: External ear normal.     Mouth/Throat:     Pharynx: No oropharyngeal exudate or posterior oropharyngeal erythema.  Eyes:     Pupils: Pupils are equal, round, and reactive to light.  Cardiovascular:     Rate and Rhythm: Normal rate and regular rhythm.     Heart sounds: No murmur heard.   Pulmonary:     Effort: No respiratory distress.     Breath sounds: Normal breath sounds.  Musculoskeletal:     Cervical back: Normal range of motion and neck supple.  Neurological:     Mental Status: He is alert and oriented to person, place, and time.       Assessment & Plan:   Johnny Foster was seen today for medical management of chronic issues.  Diagnoses and all orders for this visit:  Essential hypertension  ASCVD (arteriosclerotic cardiovascular disease) -     aspirin (ASPIRIN LOW DOSE) 81 MG EC tablet; Take 1 tablet (81 mg total) by mouth daily. Swallow whole.  Mixed hyperlipidemia  Other iron deficiency anemia  Tobacco abuse  Other orders -     nitroGLYCERIN (NITROSTAT) 0.4 MG SL tablet; Place 1 tablet (0.4 mg total) under the tongue every 5 (five) minutes as needed for chest pain. -     Discontinue: metoprolol tartrate (LOPRESSOR) 25 MG tablet; TAKE 1 TABLET(25 MG) BY MOUTH TWICE DAILY -     Discontinue: lisinopril (ZESTRIL) 5 MG tablet; TAKE 1 TABLET(5 MG) BY MOUTH DAILY -     Discontinue: atorvastatin (LIPITOR) 80 MG tablet; TAKE 1 TABLET(80 MG) BY MOUTH DAILY -     atorvastatin (LIPITOR) 80 MG tablet; TAKE 1 TABLET(80 MG) BY MOUTH DAILY -     lisinopril (ZESTRIL) 5 MG tablet; TAKE 1 TABLET(5 MG)  BY MOUTH DAILY -     metoprolol tartrate (LOPRESSOR) 25 MG tablet; TAKE 1 TABLET(25 MG) BY MOUTH TWICE DAILY   Allergies as of 08/24/2020   No Known Allergies     Medication List       Accurate as of August 24, 2020  9:11 AM. If you have any questions, ask your nurse or doctor.        acetaminophen 500 MG tablet Commonly known as: TYLENOL Take 500 mg by mouth daily as needed for moderate pain.   aspirin 81 MG EC tablet Commonly known as: Aspirin Low Dose Take 1 tablet (81 mg total) by mouth daily. Swallow whole.   atorvastatin 80 MG tablet Commonly known as: LIPITOR TAKE 1 TABLET(80 MG) BY MOUTH DAILY   clotrimazole-betamethasone cream Commonly known as: Lotrisone Apply 1 application topically 2 (two) times daily. To affected areas until rash clears   lisinopril 5 MG tablet Commonly known as: ZESTRIL TAKE 1 TABLET(5 MG) BY MOUTH DAILY   metoprolol tartrate 25 MG tablet Commonly known as: LOPRESSOR TAKE 1 TABLET(25 MG) BY MOUTH TWICE DAILY   nitroGLYCERIN 0.4 MG SL tablet Commonly known as: NITROSTAT Place 1 tablet (0.4 mg total) under the tongue every 5 (five) minutes as needed for chest pain.       Meds ordered this encounter  Medications  . nitroGLYCERIN (NITROSTAT) 0.4 MG SL tablet    Sig: Place 1 tablet (0.4 mg total) under the tongue every 5 (five) minutes as needed for chest pain.    Dispense:  25 tablet    Refill:  prn  . DISCONTD: metoprolol tartrate (LOPRESSOR) 25 MG tablet    Sig: TAKE 1 TABLET(25 MG) BY MOUTH TWICE DAILY    Dispense:  180 tablet    Refill:  0    Must be seen before next refill  . DISCONTD: lisinopril (ZESTRIL) 5 MG tablet    Sig: TAKE 1 TABLET(5 MG) BY MOUTH DAILY    Dispense:  90 tablet    Refill:  1    Must be seen before next refill  . DISCONTD: atorvastatin (LIPITOR) 80 MG tablet    Sig: TAKE 1 TABLET(80 MG) BY MOUTH DAILY    Dispense:  90 tablet    Refill:  1    Must be seen before next refill  . aspirin (ASPIRIN LOW  DOSE) 81 MG EC tablet    Sig: Take 1 tablet (81 mg total) by mouth daily. Swallow whole.    Dispense:  90 tablet    Refill:  3  . atorvastatin (LIPITOR) 80 MG tablet    Sig: TAKE 1 TABLET(80 MG) BY MOUTH DAILY    Dispense:  90 tablet    Refill:  3  . lisinopril (ZESTRIL) 5 MG tablet  Sig: TAKE 1 TABLET(5 MG) BY MOUTH DAILY    Dispense:  90 tablet    Refill:  3  . metoprolol tartrate (LOPRESSOR) 25 MG tablet    Sig: TAKE 1 TABLET(25 MG) BY MOUTH TWICE DAILY    Dispense:  180 tablet    Refill:  3      Follow-up: Return in about 6 weeks (around 10/05/2020) for Welcome to Medicare.  Mechele Claude, M.D.

## 2020-09-08 ENCOUNTER — Other Ambulatory Visit: Payer: Self-pay | Admitting: Family Medicine

## 2020-09-08 DIAGNOSIS — M19041 Primary osteoarthritis, right hand: Secondary | ICD-10-CM

## 2020-09-22 ENCOUNTER — Ambulatory Visit (INDEPENDENT_AMBULATORY_CARE_PROVIDER_SITE_OTHER): Payer: Medicare HMO | Admitting: Family Medicine

## 2020-09-22 ENCOUNTER — Encounter: Payer: Self-pay | Admitting: Family Medicine

## 2020-09-22 ENCOUNTER — Other Ambulatory Visit: Payer: Self-pay

## 2020-09-22 VITALS — BP 134/78 | HR 68 | Temp 97.7°F | Resp 20 | Ht 70.0 in | Wt 200.0 lb

## 2020-09-22 DIAGNOSIS — Z1159 Encounter for screening for other viral diseases: Secondary | ICD-10-CM

## 2020-09-22 DIAGNOSIS — Z1211 Encounter for screening for malignant neoplasm of colon: Secondary | ICD-10-CM

## 2020-09-22 DIAGNOSIS — Z125 Encounter for screening for malignant neoplasm of prostate: Secondary | ICD-10-CM

## 2020-09-22 DIAGNOSIS — E782 Mixed hyperlipidemia: Secondary | ICD-10-CM

## 2020-09-22 DIAGNOSIS — Z114 Encounter for screening for human immunodeficiency virus [HIV]: Secondary | ICD-10-CM

## 2020-09-22 DIAGNOSIS — Z0000 Encounter for general adult medical examination without abnormal findings: Secondary | ICD-10-CM | POA: Diagnosis not present

## 2020-09-22 DIAGNOSIS — E559 Vitamin D deficiency, unspecified: Secondary | ICD-10-CM

## 2020-09-22 NOTE — Progress Notes (Signed)
WELCOME TO MEDICARE (IPPE) VISIT  09/22/2020  Johnny Foster DOB:1956-06-13     TFT:[id number]  I explained that today's visit was for the purpose of health promotion and disease detection, as well as an introduction to Medicare and it's covered benefits.  I explained that no labs or other services would be performed today. If labs or other services are determined to be necessary the appropriate orders/referrals will be arranged for these to be done at a future date.  Johnny Foster is a 64 y.o. year old male primary care patient of Dr. Mechele Claude.   MEDICAL AND SOCIAL HISTORY Past Medical History Past Medical History:  Diagnosis Date   Hepatitis C    Treated   Hyperlipidemia    Hypertension    Iron deficiency anemia 10/02/2018   Myocardial infarction [facility])    May 2019.  LAD moderate 60 to 80% stenosis.  Circumflex OM occluded.  RCA 80% stenosis.  OM was thought to be a acute infarct vessel.  It was treated with a Synergy stent.  This is a 2.5 x 20 mm, RCA was treated with a 3.  3.0 by 16 mm Synergy.  Elective PCI in June of an LAD lesion with 3.0 x 38 mm Synergy.   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    Surgical History Past Surgical History:  Procedure Laterality Date   TONSILECTOMY, ADENOIDECTOMY, BILATERAL MYRINGOTOMY AND TUBES  1969    Family History Family History  Problem Relation Age of Onset   Arthritis Mother    CAD Mother 2   Lung disease Father    Cancer Brother     Social History Social History   Socioeconomic History   Marital status: Married    Spouse name: Not on file   Number of children: Not on file   Years of education: Not on file   Highest education level: Not on file  Occupational History   Occupation: retired  Tobacco Use   Smoking status: Current Every Day Smoker    Packs/day: 1.00    Years: 40.00    Pack years: 40.00    Types: Cigarettes   Smokeless tobacco: Never Used  Haematologist Use: Never used  Substance and Sexual Activity   Alcohol use: Yes    Comment: rarely   Drug use: Not Currently    Types: Other-see comments    Comment: history of Heroin and Cocaine use in his teens    Sexual activity: Yes  Other Topics Concern   Not on file  Social History Narrative   Not on file   Social Determinants of Health   Financial Resource Strain: Low Risk    Difficulty of Paying Living Expenses: Not hard at all  Food Insecurity: No Food Insecurity   Worried About Programme researcher, broadcasting/film/video in the Last Year: Never true   Ran Out of Food in the Last Year: Never true  Transportation Needs: No Transportation Needs   Lack of Transportation (Medical): No   Lack of Transportation (Non-Medical): No  Physical Activity: Insufficiently Active   Days of Exercise per Week: 2 days   Minutes of Exercise per Session: 30 min  Stress: No Stress Concern Present   Feeling of Stress : Only a little  Social Connections: Unknown   Frequency of Communication with Friends and Family: Not on file   Frequency of Social Gatherings with Friends and Family: Not on file   Attends Religious Services:  Not on file   Active Member of Clubs or Organizations: Not on file   Attends Club or Organization Meetings: Not on file   Marital Status: Married    Current Medications & Allergies Outpatient Encounter Medications as of 09/22/2020  Medication Sig   acetaminophen (TYLENOL) 500 MG tablet Take 500 mg by mouth daily as needed for moderate pain.   aspirin (ASPIRIN LOW DOSE) 81 MG EC tablet Take 1 tablet (81 mg total) by mouth daily. Swallow whole.   atorvastatin (LIPITOR) 80 MG tablet TAKE 1 TABLET(80 MG) BY MOUTH DAILY   clotrimazole-betamethasone (LOTRISONE) cream Apply 1 application topically 2 (two) times daily. To affected areas until rash clears   lisinopril (ZESTRIL) 5 MG tablet TAKE 1 TABLET(5 MG) BY MOUTH DAILY   metoprolol tartrate (LOPRESSOR) 25 MG tablet TAKE 1  TABLET(25 MG) BY MOUTH TWICE DAILY   nitroGLYCERIN (NITROSTAT) 0.4 MG SL tablet Place 1 tablet (0.4 mg total) under the tongue every 5 (five) minutes as needed for chest pain.   No facility-administered encounter medications on file as of 09/22/2020.   Patient has no known allergies.  Diet & Physical Activity     Pt consumes 3 meals a day and 2 snacks a day.  The patient feels that he mostly follow a Regular diet.   DEPRESSION SCREENING PHQ 2/9 Scores 09/22/2020 08/24/2020 05/14/2020 10/03/2019 06/05/2019 01/09/2019 12/25/2018  PHQ - 2 Score 0 0 0 0 0 0 0  PHQ- 9 Score - - - - 0 - -     FUNCTIONAL ABILITY & LEVEL OF SAFETY Fall Risk Fall Risk  08/24/2020 10/03/2019 01/09/2019 12/25/2018 06/04/2018  Falls in the past year? 0 0 0 0 No  Follow up Falls evaluation completed - - - -    Activities of Daily Living No flowsheet data found.  Cognitive Function       Normal Cognitive Function Screening today: Yes   EXAM (physical exam is not indicated for this visit type) Today's Vitals   09/22/20 1056  BP: 134/78  Pulse: 68  Resp: 20  Temp: 97.7 F (36.5 C)  TempSrc: Temporal  SpO2: 97%  Weight: 200 lb (90.7 kg)  Height: 5\' 10"  (1.778 m)   Body mass index is 28.7 kg/m.  Visual Acuity Screen: Pt. Hasn't seen an eye doctor in several years. No additional physical exam required or indicated today.  Health Maintenance Health Maintenance  Topic Date Due   Hepatitis C Screening  Never done   COVID-19 Vaccine (1) Never done   TETANUS/TDAP  08/24/2021 (Originally 04/01/1977)   COLONOSCOPY  03/05/2025   INFLUENZA VACCINE  Completed   HIV Screening  Completed     END OF LIFE PLANNING Advanced directives and power of attorney information specific to the patient were discussed.    Advanced Directives 12/07/2019 06/11/2019 03/08/2019 11/12/2018 10/12/2018 10/05/2018 10/02/2018  Does Patient Have a Medical Advance Directive? No No No Yes Yes Yes Yes  Type of Advance Directive  - - - 13/10/2018 Power of Customer service manager Power of Attorney  Does patient want to make changes to medical advance directive? - - - No - Patient declined No - Patient declined No - Patient declined No - Patient declined  Copy of Healthcare Power of Attorney in Chart? - - - No - copy requested No - copy requested No - copy requested No - copy requested  Would patient like information on creating a medical advance directive? No - Patient  declined - No - Patient declined - - - -     Education, counseling, and referrals based on the information obtained/reviewed today: Pt. Will work on smoking cessation. See an Eye doctor - [facility]. Recommended, Increase exercise  Education, counseling, and referral for other preventive services: Written checklist was completed and given to pt for obtaining, as appropriate, the other preventive services that are covered as separate Medicare Part B benefits. Possible services that were reviewed with pt are:  -annual wellness visit (AWV) performed. Recommended annually -Cardiovascular screening blood tests ordered -Colorectal cancer screening - UTD -Diabetes screening tests - ordered -Glaucoma screening recommended -HIV screening ordered -Medical nutrition therapy reviewed -Prostate cancer screening- ordered -Seasonal influenza, pneumococcal, and Hep B vaccines pending  -ultrasound screening for AAA    Patient Did not  have an additional complaint/problem that was discussed and evaluated today.  Patient was given opportunity to ask any additional questions regarding Medicare and covered benefits.  Patient was informed that Medicare does not provide coverage for routine physical exams.  I answered all questions to the best of my ability today.    Follow up: 6 mos

## 2020-09-22 NOTE — Patient Instructions (Signed)
Take zinc 40 mg daily, Vitamin D 3000 units a day and Vitamin C daily for immune system  Aortic aneurysm test will be arranged for you.  Set up CoVID Booster  Arrange appointment for Eye Exam  Remember daily exercise

## 2020-09-23 LAB — HEPATITIS C ANTIBODY: Hep C Virus Ab: >11 s/co ratio — ABNORMAL HIGH (ref 0.0–0.9)

## 2020-09-23 LAB — CMP14+EGFR
ALT: 18 IU/L (ref 0–44)
AST: 17 IU/L (ref 0–40)
Albumin/Globulin Ratio: 1.9 (ref 1.2–2.2)
Albumin: 4.6 g/dL (ref 3.8–4.8)
Alkaline Phosphatase: 76 IU/L (ref 44–121)
BUN/Creatinine Ratio: 13 (ref 10–24)
BUN: 12 mg/dL (ref 8–27)
Bilirubin Total: 0.3 mg/dL (ref 0.0–1.2)
CO2: 25 mmol/L (ref 20–29)
Calcium: 9.8 mg/dL (ref 8.6–10.2)
Chloride: 105 mmol/L (ref 96–106)
Creatinine, Ser: 0.92 mg/dL (ref 0.76–1.27)
GFR calc Af Amer: 103 mL/min/{1.73_m2} (ref >59)
GFR calc non Af Amer: 89 mL/min/{1.73_m2} (ref >59)
Globulin, Total: 2.4 g/dL (ref 1.5–4.5)
Glucose: 67 mg/dL (ref 65–99)
Potassium: 4.7 mmol/L (ref 3.5–5.2)
Sodium: 141 mmol/L (ref 134–144)
Total Protein: 7 g/dL (ref 6.0–8.5)

## 2020-09-23 LAB — LIPID PANEL
Chol/HDL Ratio: 2.3 ratio (ref 0.0–5.0)
Cholesterol, Total: 125 mg/dL (ref 100–199)
HDL: 55 mg/dL (ref >39)
LDL Chol Calc (NIH): 51 mg/dL (ref 0–99)
Triglycerides: 103 mg/dL (ref 0–149)
VLDL Cholesterol Cal: 19 mg/dL (ref 5–40)

## 2020-09-23 LAB — CBC WITH DIFFERENTIAL/PLATELET
Basophils Absolute: 0.1 10*3/uL (ref 0.0–0.2)
Basos: 1 %
EOS (ABSOLUTE): 0.5 10*3/uL — ABNORMAL HIGH (ref 0.0–0.4)
Eos: 5 %
Hematocrit: 48.5 % (ref 37.5–51.0)
Hemoglobin: 16.5 g/dL (ref 13.0–17.7)
Immature Grans (Abs): 0.1 10*3/uL (ref 0.0–0.1)
Immature Granulocytes: 1 %
Lymphocytes Absolute: 2.1 10*3/uL (ref 0.7–3.1)
Lymphs: 21 %
MCH: 32.4 pg (ref 26.6–33.0)
MCHC: 34 g/dL (ref 31.5–35.7)
MCV: 95 fL (ref 79–97)
Monocytes Absolute: 0.9 10*3/uL (ref 0.1–0.9)
Monocytes: 9 %
Neutrophils Absolute: 6.6 10*3/uL (ref 1.4–7.0)
Neutrophils: 63 %
Platelets: 230 10*3/uL (ref 150–450)
RBC: 5.1 x10E6/uL (ref 4.14–5.80)
RDW: 12.8 % (ref 11.6–15.4)
WBC: 10.2 10*3/uL (ref 3.4–10.8)

## 2020-09-23 LAB — PSA TOTAL (REFLEX TO FREE): Prostate Specific Ag, Serum: 0.6 ng/mL (ref 0.0–4.0)

## 2020-09-23 LAB — HIV ANTIBODY (ROUTINE TESTING W REFLEX): HIV Screen 4th Generation wRfx: NONREACTIVE

## 2020-09-24 ENCOUNTER — Other Ambulatory Visit: Payer: Self-pay | Admitting: *Deleted

## 2020-09-24 ENCOUNTER — Telehealth: Payer: Self-pay

## 2020-09-24 DIAGNOSIS — Z8619 Personal history of other infectious and parasitic diseases: Secondary | ICD-10-CM

## 2020-09-24 NOTE — Telephone Encounter (Signed)
Pt rc for nurse 

## 2020-09-24 NOTE — Telephone Encounter (Signed)
PATIENT AWARE AND COMING FOR LABS IN THE MORNING

## 2020-09-24 NOTE — Telephone Encounter (Signed)
Patient's Hep C elevated.  Patient has had past infection with Hep C and has been treated.  Dr. Darlyn Read would like patient to come back in for Hep C viral load test.  Left message to call back.

## 2020-09-25 ENCOUNTER — Other Ambulatory Visit: Payer: Medicare HMO

## 2020-09-25 ENCOUNTER — Other Ambulatory Visit: Payer: Self-pay

## 2020-09-27 LAB — HEPATITIS C VRS RNA DETECT BY PCR-QUAL: HCV RNA NAA Qualitative: NEGATIVE

## 2020-09-28 ENCOUNTER — Telehealth: Payer: Self-pay

## 2020-09-28 NOTE — Telephone Encounter (Signed)
Patient came by Friday, 09/25/2020, to have his Hep C viral load test performed.  He wanted to make you aware that when he was treated in the past for Hep C he was given  Harvoni 90-400 mg tablets.  This was in 2017.  No need to return call, just wanted to inform you.

## 2020-09-29 ENCOUNTER — Other Ambulatory Visit: Payer: Self-pay

## 2020-09-29 ENCOUNTER — Ambulatory Visit
Admission: EM | Admit: 2020-09-29 | Discharge: 2020-09-29 | Disposition: A | Discharge status: Home / Self Care | Payer: Medicare HMO | Attending: Emergency Medicine | Admitting: Emergency Medicine

## 2020-09-29 DIAGNOSIS — R059 Cough, unspecified: Secondary | ICD-10-CM | POA: Diagnosis not present

## 2020-09-29 MED ORDER — PREDNISONE 20 MG PO TABS: 20.0000 mg | ORAL_TABLET | Freq: Two times a day (BID) | ORAL | 0 refills | Status: AC

## 2020-09-29 MED ORDER — PREDNISONE 20 MG PO TABS: 20.0000 mg | ORAL_TABLET | Freq: Two times a day (BID) | ORAL | 0 refills | Status: DC

## 2020-09-29 MED ORDER — BENZONATATE 100 MG PO CAPS: 100.0000 mg | ORAL_CAPSULE | Freq: Three times a day (TID) | ORAL | 0 refills | Status: DC

## 2020-09-29 NOTE — Discharge Instructions (Signed)
Declines covid test Get plenty of rest and push fluids Tessalon Perles prescribed for cough Use OTC zyrtec for nasal congestion, runny nose, and/or sore throat Use OTC flonase for nasal congestion and runny nose Use medications daily for symptom relief Use OTC medications like ibuprofen or tylenol as needed fever or pain Call or go to the ED if you have any new or worsening symptoms such as fever, worsening cough, shortness of breath, chest tightness, chest pain, turning blue, changes in mental status, etc...  

## 2020-09-29 NOTE — ED Triage Notes (Signed)
Pt presents with co nasal congestion and cough that began yesterday 

## 2020-09-29 NOTE — ED Triage Notes (Signed)
Declines covid test

## 2020-09-29 NOTE — ED Provider Notes (Signed)
[facility] CARE CENTER   [id number] 09/29/20 Arrival Time: 0808   CC: COVID symptoms  SUBJECTIVE: History from: patient.  Johnny Foster is a 64 y.o. male who presents with nasal congestion, sinus pain/ pressure, sore throat, and cough x 1 day.  Denies sick exposure to COVID, flu or strep.  Has tried OTC medications without relief.  Symptoms are made worse with swallowing.  Reports previous symptoms in the past.   Denies fever, chills, SOB, wheezing, chest pain, nausea, changes in bowel or bladder habits.     ROS: As per HPI.  All other pertinent ROS negative.     Past Medical History:  Diagnosis Date  . Hepatitis C    Treated  . Hyperlipidemia   . Hypertension   . Iron deficiency anemia 10/02/2018  . Myocardial infarction [facility])    May 2019.  LAD moderate 60 to 80% stenosis.  Circumflex OM occluded.  RCA 80% stenosis.  OM was thought to be a acute infarct vessel.  It was treated with a Synergy stent.  This is a 2.5 x 20 mm, RCA was treated with a 3.  3.0 by 16 mm Synergy.  Elective PCI in June of an LAD lesion with 3.0 x 38 mm Synergy.  . 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 TONSILECTOMY, ADENOIDECTOMY, BILATERAL MYRINGOTOMY AND TUBES  1969   No Known Allergies No current facility-administered medications on file prior to encounter.   Current Outpatient Medications on File Prior to Encounter  Medication Sig Dispense Refill  . acetaminophen (TYLENOL) 500 MG tablet Take 500 mg by mouth daily as needed for moderate pain.    Marland Kitchen aspirin (ASPIRIN LOW DOSE) 81 MG EC tablet Take 1 tablet (81 mg total) by mouth daily. Swallow whole. 90 tablet 3  . atorvastatin (LIPITOR) 80 MG tablet TAKE 1 TABLET(80 MG) BY MOUTH DAILY 90 tablet 3  . clotrimazole-betamethasone (LOTRISONE) cream Apply 1 application topically 2 (two) times daily. To affected areas until rash clears 45 g 5  . lisinopril (ZESTRIL) 5 MG tablet  TAKE 1 TABLET(5 MG) BY MOUTH DAILY 90 tablet 3  . metoprolol tartrate (LOPRESSOR) 25 MG tablet TAKE 1 TABLET(25 MG) BY MOUTH TWICE DAILY 180 tablet 3  . nitroGLYCERIN (NITROSTAT) 0.4 MG SL tablet Place 1 tablet (0.4 mg total) under the tongue every 5 (five) minutes as needed for chest pain. 25 tablet prn   Social History   Socioeconomic History  . Marital status: Married    Spouse name: Not on file  . Number of children: 2  . Years of education: Not on file  . Highest education level: Not on file  Occupational History  . Occupation: retired  Tobacco Use  . Smoking status: Current Every Day Smoker    Packs/day: 0.25    Years: 40.00    Pack years: 10.00    Types: Cigarettes  . Smokeless tobacco: Never Used  Vaping Use  . Vaping Use: Never used  Substance and Sexual Activity  . Alcohol use: Yes    Comment: rarely  . Drug use: Not Currently    Types: Other-see comments    Comment: history of Heroin and Cocaine use in his teens   . Sexual activity: Yes  Other Topics Concern  . Not on file  Social History Narrative  . Not on file   Social Determinants of Health   Financial Resource Strain: Low Risk   .  Difficulty of Paying Living Expenses: Not hard at all  Food Insecurity: No Food Insecurity  . Worried About Programme researcher, broadcasting/film/video in the Last Year: Never true  . Ran Out of Food in the Last Year: Never true  Transportation Needs: No Transportation Needs  . Lack of Transportation (Medical): No  . Lack of Transportation (Non-Medical): No  Physical Activity: Insufficiently Active  . Days of Exercise per Week: 2 days  . Minutes of Exercise per Session: 30 min  Stress: No Stress Concern Present  . Feeling of Stress : Only a little  Social Connections: Unknown  . Frequency of Communication with Friends and Family: Not on file  . Frequency of Social Gatherings with Friends and Family: Not on file  . Attends Religious Services: Not on file  . Active Member of Clubs or  Organizations: Not on file  . Attends Banker Meetings: Not on file  . Marital Status: Married  Catering manager Violence: Not At Risk  . Fear of Current or Ex-Partner: No  . Emotionally Abused: No  . Physically Abused: No  . Sexually Abused: No   Family History  Problem Relation Age of Onset  . Arthritis Mother   . CAD Mother 13  . Lung disease Father   . Cancer Brother     OBJECTIVE:  Vitals:   09/29/20 0825  BP: 133/76  Pulse: 66  Resp: 18  Temp: 98.2 F (36.8 C)  SpO2: 95%     General appearance: alert; mildly fatigued appearing, nontoxic; speaking in full sentences and tolerating own secretions HEENT: NCAT; Ears: EACs clear, TMs pearly gray; Eyes: PERRL.  EOM grossly intact.Nose: nares patent without rhinorrhea, Throat: oropharynx clear, tonsils non erythematous or enlarged, uvula midline  Neck: supple without LAD Lungs: unlabored respirations, symmetrical air entry; cough: absent; no respiratory distress; CTAB Heart: regular rate and rhythm.  Skin: warm and dry Psychological: alert and cooperative; normal mood and affect   ASSESSMENT & PLAN:  1. Cough     Meds ordered this encounter  Medications  . benzonatate (TESSALON) 100 MG capsule    Sig: Take 1 capsule (100 mg total) by mouth every 8 (eight) hours.    Dispense:  21 capsule    Refill:  0    Order Specific Question:   Supervising Provider    Answer:   Eustace Moore [[id number]]  . predniSONE (DELTASONE) 20 MG tablet    Sig: Take 1 tablet (20 mg total) by mouth 2 (two) times daily with a meal for 5 days.    Dispense:  10 tablet    Refill:  0    Order Specific Question:   Supervising Provider    Answer:   Eustace Moore [[id number]]   Declines covid test Get plenty of rest and push fluids Tessalon Perles prescribed for cough Use OTC zyrtec for nasal congestion, runny nose, and/or sore throat Use OTC flonase for nasal congestion and runny nose Use medications daily for symptom  relief Use OTC medications like ibuprofen or tylenol as needed fever or pain Call or go to the ED if you have any new or worsening symptoms such as fever, worsening cough, shortness of breath, chest tightness, chest pain, turning blue, changes in mental status, etc...   Prednisone for congestion  Reviewed expectations re: course of current medical issues. Questions answered. Outlined signs and symptoms indicating need for more acute intervention. Patient verbalized understanding. After Visit Summary given.  Rennis Harding, PA-C 09/29/20 [phone]

## 2020-09-30 ENCOUNTER — Other Ambulatory Visit: Payer: Medicare HMO

## 2020-09-30 ENCOUNTER — Other Ambulatory Visit: Payer: Self-pay | Admitting: Critical Care Medicine

## 2020-09-30 DIAGNOSIS — Z20822 Contact with and (suspected) exposure to covid-19: Secondary | ICD-10-CM

## 2020-10-01 ENCOUNTER — Telehealth: Payer: Self-pay

## 2020-10-01 LAB — SARS-COV-2, NAA 2 DAY TAT

## 2020-10-01 LAB — SPECIMEN STATUS REPORT

## 2020-10-01 LAB — NOVEL CORONAVIRUS, NAA: SARS-CoV-2, NAA: NOT DETECTED

## 2020-10-01 NOTE — Telephone Encounter (Signed)
Attempted to contact patient - NA COVID test is not back- will call patient once it has came back

## 2020-10-01 NOTE — Telephone Encounter (Signed)
Will contact patient once results come back.

## 2020-10-06 ENCOUNTER — Ambulatory Visit (HOSPITAL_COMMUNITY): Admission: RE | Admit: 2020-10-06 | Payer: Medicare HMO | Source: Ambulatory Visit

## 2020-10-07 NOTE — Telephone Encounter (Signed)
Attempted to contact patient, no answer.  Covid test was negative, normal result, encounter closed.

## 2020-10-16 ENCOUNTER — Emergency Department (HOSPITAL_COMMUNITY)
Admission: EM | Admit: 2020-10-16 | Discharge: 2020-10-16 | Disposition: A | Discharge status: Home / Self Care | Payer: Medicare HMO | Attending: Emergency Medicine | Admitting: Emergency Medicine

## 2020-10-16 ENCOUNTER — Emergency Department (HOSPITAL_COMMUNITY): Payer: Medicare HMO

## 2020-10-16 ENCOUNTER — Other Ambulatory Visit: Payer: Self-pay

## 2020-10-16 ENCOUNTER — Encounter (HOSPITAL_COMMUNITY): Payer: Self-pay | Admitting: Emergency Medicine

## 2020-10-16 DIAGNOSIS — F17210 Nicotine dependence, cigarettes, uncomplicated: Secondary | ICD-10-CM | POA: Diagnosis not present

## 2020-10-16 DIAGNOSIS — Z7982 Long term (current) use of aspirin: Secondary | ICD-10-CM | POA: Insufficient documentation

## 2020-10-16 DIAGNOSIS — I10 Essential (primary) hypertension: Secondary | ICD-10-CM | POA: Insufficient documentation

## 2020-10-16 DIAGNOSIS — Z79899 Other long term (current) drug therapy: Secondary | ICD-10-CM | POA: Diagnosis not present

## 2020-10-16 DIAGNOSIS — S20212A Contusion of left front wall of thorax, initial encounter: Secondary | ICD-10-CM | POA: Insufficient documentation

## 2020-10-16 DIAGNOSIS — W19XXXA Unspecified fall, initial encounter: Secondary | ICD-10-CM

## 2020-10-16 DIAGNOSIS — W11XXXA Fall on and from ladder, initial encounter: Secondary | ICD-10-CM | POA: Diagnosis not present

## 2020-10-16 DIAGNOSIS — S299XXA Unspecified injury of thorax, initial encounter: Secondary | ICD-10-CM | POA: Diagnosis present

## 2020-10-16 MED ORDER — HYDROCODONE-ACETAMINOPHEN 5-325 MG PO TABS
ORAL_TABLET | ORAL | 0 refills | Status: DC
Start: 2020-10-16 — End: 2021-05-25

## 2020-10-16 MED ORDER — NAPROXEN 500 MG PO TABS: 500.0000 mg | ORAL_TABLET | Freq: Two times a day (BID) | ORAL | 0 refills | Status: DC

## 2020-10-16 NOTE — Discharge Instructions (Addendum)
Your chest x-ray today did not show evidence of a broken rib or punctured lung.  Sometimes small fractures of the ribs are difficult to see on x-ray.  It is important that you use the spirometer as directed.  You may hold a small pillow or folded towel to your side for coughing or deep breathing.  You may find that sleeping in a recliner for the first several nights is less painful.  Avoid twisting, bending, or heavy lifting for at least 7 to 10 days.  Follow-up with your primary care provider for recheck.  Return to emergency department if you develop any sudden shortness of breath, increasing chest pain or noticing blood when you cough

## 2020-10-16 NOTE — ED Triage Notes (Signed)
On Wednesday, Johnny Foster of off 2nd step on six foot ladder.  Hit left rib.  Rates pain 9/10 to left rib, Pain with cough and deep breathe.

## 2020-10-19 NOTE — ED Provider Notes (Signed)
[facility] EMERGENCY DEPARTMENT Provider Note   CSN: [id number] Arrival date & time: 10/16/20  2094     History Chief Complaint  Patient presents with  . Chest Pain    left    Johnny Foster is a 64 y.o. male.  HPI      Johnny Foster is a 64 y.o. male who presents to the Emergency Department complaining of left rib pain secondary to mechanical fall.  He states he was standing on a ladder and fell approximately 2 feet landing on his left side.  Initially, he states pain was minimal and he continued to perform his daily activities.  Pain became worse on the day prior to arrival after chopping wood.  He describes the pain is worse with sitting up, deep breathing and coughing.  He denies hemoptysis, abdominal pain, and shortness of breath.  Past Medical History:  Diagnosis Date  . Hepatitis C    Treated  . Hyperlipidemia   . Hypertension   . Iron deficiency anemia 10/02/2018  . Myocardial infarction [facility])    May 2019.  LAD moderate 60 to 80% stenosis.  Circumflex OM occluded.  RCA 80% stenosis.  OM was thought to be a acute infarct vessel.  It was treated with a Synergy stent.  This is a 2.5 x 20 mm, RCA was treated with a 3.  3.0 by 16 mm Synergy.  Elective PCI in June of an LAD lesion with 3.0 x 38 mm Synergy.  . 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 Educated about COVID-19 virus infection 05/25/2020  . Absolute anemia 10/11/2018  . Iron deficiency anemia 10/02/2018  . Encounter for long-term (current) use of medications 09/19/2018  . Tobacco abuse 09/19/2018  . ASCVD (arteriosclerotic cardiovascular disease) 09/09/2018  . Essential hypertension 09/09/2018  . Mixed hyperlipidemia 09/09/2018    Past Surgical History:  Procedure Laterality Date  . TONSILECTOMY, ADENOIDECTOMY, BILATERAL MYRINGOTOMY AND TUBES  1969       Family History  Problem Relation Age of Onset  . Arthritis  Mother   . CAD Mother 38  . Lung disease Father   . Cancer Brother     Social History   Tobacco Use  . Smoking status: Current Every Day Smoker    Packs/day: 0.25    Years: 40.00    Pack years: 10.00    Types: Cigarettes  . Smokeless tobacco: Never Used  Vaping Use  . Vaping Use: Never used  Substance Use Topics  . Alcohol use: Yes    Comment: rarely  . Drug use: Not Currently    Types: Other-see comments    Comment: history of Heroin and Cocaine use in his teens     Home Medications Prior to Admission medications   Medication Sig Start Date End Date Taking? Authorizing Provider  aspirin (ASPIRIN LOW DOSE) 81 MG EC tablet Take 1 tablet (81 mg total) by mouth daily. Swallow whole. 08/24/20   Mechele Claude, MD  atorvastatin (LIPITOR) 80 MG tablet TAKE 1 TABLET(80 MG) BY MOUTH DAILY 08/24/20   Mechele Claude, MD  benzonatate (TESSALON) 100 MG capsule Take 1 capsule (100 mg total) by mouth every 8 (eight) hours. 09/29/20   Wurst, [place], PA-C  clotrimazole-betamethasone (LOTRISONE) cream Apply 1 application topically 2 (two) times daily. To affected areas until rash clears 10/03/19   Mechele Claude, MD  HYDROcodone-acetaminophen (NORCO/VICODIN) 5-325 MG tablet Take  one tab po q 4 hrs prn pain 10/16/20   Cephas Revard, PA-C  lisinopril (ZESTRIL) 5 MG tablet TAKE 1 TABLET(5 MG) BY MOUTH DAILY 08/24/20   Mechele Claude, MD  metoprolol tartrate (LOPRESSOR) 25 MG tablet TAKE 1 TABLET(25 MG) BY MOUTH TWICE DAILY 08/24/20   Mechele Claude, MD  naproxen (NAPROSYN) 500 MG tablet Take 1 tablet (500 mg total) by mouth 2 (two) times daily with a meal. 10/16/20   Dalisha Shively, PA-C  nitroGLYCERIN (NITROSTAT) 0.4 MG SL tablet Place 1 tablet (0.4 mg total) under the tongue every 5 (five) minutes as needed for chest pain. 08/24/20   Mechele Claude, MD    Allergies    Patient has no known allergies.  Review of Systems   Review of Systems  Constitutional: Negative for chills and fever.    Respiratory: Negative for cough and shortness of breath.   Cardiovascular: Positive for chest pain (Left chest wall pain).  Gastrointestinal: Negative for abdominal pain, nausea and vomiting.  Genitourinary: Negative for difficulty urinating, dysuria and hematuria.  Musculoskeletal: Negative for arthralgias, back pain, joint swelling and neck pain.  Skin: Negative for color change and wound.  Neurological: Negative for dizziness, weakness, numbness and headaches.    Physical Exam Updated Vital Signs BP 129/84   Pulse 66   Temp 98.2 F (36.8 C) (Oral)   Resp 16   Ht 5\' 10"  (1.778 m)   Wt 88.5 kg   SpO2 96%   BMI 27.98 kg/m   Physical Exam Constitutional:      Appearance: Normal appearance. He is not ill-appearing or toxic-appearing.  HENT:     Head: Atraumatic.     Mouth/Throat:     Mouth: Mucous membranes are moist.     Pharynx: Oropharynx is clear.  Neck:     Thyroid: No thyromegaly.     Meningeal: Kernig's sign absent.  Cardiovascular:     Rate and Rhythm: Normal rate and regular rhythm.     Pulses: Normal pulses.  Pulmonary:     Effort: Pulmonary effort is normal.     Breath sounds: Normal breath sounds. No wheezing.  Chest:     Chest wall: Tenderness (Patient has focal tenderness to palpation along the mid left lateral rib.  No guarding, crepitus, or bony deformity.) present.  Abdominal:     Palpations: Abdomen is soft.     Tenderness: There is no abdominal tenderness. There is no guarding or rebound.  Musculoskeletal:        General: No swelling. Normal range of motion.     Cervical back: Normal range of motion and neck supple. No tenderness.  Skin:    General: Skin is warm.     Findings: No rash.  Neurological:     General: No focal deficit present.     Mental Status: He is alert.     Sensory: No sensory deficit.     Motor: No weakness.     ED Results / Procedures / Treatments   Labs (all labs ordered are listed, but only abnormal results are  displayed) Labs Reviewed - No data to display  EKG None  Radiology No results found.  Procedures Procedures (including critical care time)  Medications Ordered in ED Medications - No data to display  ED Course  I have reviewed the triage vital signs and the nursing notes.  Pertinent labs & imaging results that were available during my care of the patient were reviewed by me and considered in my medical decision  making (see chart for details).    MDM Rules/Calculators/A&P                          Patient here for evaluation of rib pain secondary to mechanical fall.  Fall reported as 2 feet in distance and minimal at onset, but has gradually worsened after physical activity.  On exam, patient well-appearing.  Vital signs reassuring.  No increased work of breathing or chest wall guarding on exam.  X-ray without evidence of acute fracture or pneumothorax.  Likely contusion.  Patient appears appropriate for discharge home at this time.  He agrees to symptomatic treatment and outpatient follow-up.  Return precautions were discussed.  Incentive spirometer dispensed with use instruction  Final Clinical Impression(s) / ED Diagnoses Final diagnoses:  Rib contusion, left, initial encounter  Fall, initial encounter    Rx / DC Orders ED Discharge Orders         Ordered    HYDROcodone-acetaminophen (NORCO/VICODIN) 5-325 MG tablet        10/16/20 1101    naproxen (NAPROSYN) 500 MG tablet  2 times daily with meals        10/16/20 1101           Pauline Aus, PA-C 10/19/20 1358    Bethann Berkshire, MD 10/20/20 [phone]

## 2020-10-29 ENCOUNTER — Other Ambulatory Visit: Payer: Medicare HMO

## 2020-10-29 ENCOUNTER — Other Ambulatory Visit: Payer: Self-pay

## 2020-10-29 DIAGNOSIS — Z20822 Contact with and (suspected) exposure to covid-19: Secondary | ICD-10-CM

## 2020-10-31 LAB — SPECIMEN STATUS REPORT

## 2020-10-31 LAB — SARS-COV-2, NAA 2 DAY TAT

## 2020-10-31 LAB — NOVEL CORONAVIRUS, NAA: SARS-CoV-2, NAA: NOT DETECTED

## 2020-12-02 ENCOUNTER — Other Ambulatory Visit: Payer: Self-pay | Admitting: *Deleted

## 2020-12-02 ENCOUNTER — Other Ambulatory Visit: Payer: Self-pay | Admitting: Family Medicine

## 2020-12-02 DIAGNOSIS — Z87891 Personal history of nicotine dependence: Secondary | ICD-10-CM

## 2020-12-02 DIAGNOSIS — F17210 Nicotine dependence, cigarettes, uncomplicated: Secondary | ICD-10-CM

## 2020-12-08 ENCOUNTER — Other Ambulatory Visit: Payer: Medicare HMO

## 2020-12-11 ENCOUNTER — Other Ambulatory Visit: Payer: Medicare HMO

## 2020-12-14 ENCOUNTER — Other Ambulatory Visit: Payer: Self-pay

## 2020-12-14 ENCOUNTER — Other Ambulatory Visit: Payer: Self-pay | Admitting: Internal Medicine

## 2020-12-14 DIAGNOSIS — Z20822 Contact with and (suspected) exposure to covid-19: Secondary | ICD-10-CM

## 2020-12-15 LAB — SARS-COV-2, NAA 2 DAY TAT

## 2020-12-15 LAB — SPECIMEN STATUS REPORT

## 2020-12-15 LAB — NOVEL CORONAVIRUS, NAA: SARS-CoV-2, NAA: NOT DETECTED

## 2021-01-05 ENCOUNTER — Ambulatory Visit (HOSPITAL_COMMUNITY)
Admission: RE | Admit: 2021-01-05 | Discharge: 2021-01-05 | Disposition: A | Discharge status: Home / Self Care | Payer: Medicare HMO | Source: Ambulatory Visit | Attending: Acute Care | Admitting: Acute Care

## 2021-01-05 ENCOUNTER — Other Ambulatory Visit: Payer: Self-pay

## 2021-01-05 DIAGNOSIS — Z87891 Personal history of nicotine dependence: Secondary | ICD-10-CM | POA: Insufficient documentation

## 2021-01-05 DIAGNOSIS — F17210 Nicotine dependence, cigarettes, uncomplicated: Secondary | ICD-10-CM | POA: Insufficient documentation

## 2021-01-07 ENCOUNTER — Telehealth: Payer: Self-pay | Admitting: Acute Care

## 2021-01-07 DIAGNOSIS — F17210 Nicotine dependence, cigarettes, uncomplicated: Secondary | ICD-10-CM

## 2021-01-07 DIAGNOSIS — Z87891 Personal history of nicotine dependence: Secondary | ICD-10-CM

## 2021-01-07 NOTE — Telephone Encounter (Signed)
Pt informed of CT results per Sarah Groce, NP.  PT verbalized understanding.  Copy sent to PCP.  Order placed for 1 yr f/u CT.  

## 2021-01-12 NOTE — Progress Notes (Signed)
Please call patient and let them  know their  low dose Ct was read as a Lung RADS 2: nodules that are benign in appearance and behavior with a very low likelihood of becoming a clinically active cancer due to size or lack of growth. Recommendation per radiology is for a repeat LDCT in 12 months. .Please let them  know we will order and schedule their  annual screening scan for 11/2021. Please let them  know there was notation of CAD on their  scan.  Please remind the patient  that this is a non-gated exam therefore degree or severity of disease  cannot be determined. Please have them  follow up with their PCP regarding potential risk factor modification, dietary therapy or pharmacologic therapy if clinically indicated. Pt.  is  currently on statin therapy. Please place order for annual  screening scan for  12/2021 and fax results to PCP. Thanks so much.

## 2021-01-14 ENCOUNTER — Other Ambulatory Visit: Payer: Self-pay | Admitting: *Deleted

## 2021-05-25 ENCOUNTER — Encounter: Payer: Self-pay | Admitting: Physician Assistant

## 2021-05-25 ENCOUNTER — Other Ambulatory Visit: Payer: Self-pay

## 2021-05-25 ENCOUNTER — Encounter (HOSPITAL_COMMUNITY): Payer: Self-pay | Admitting: Internal Medicine

## 2021-05-25 ENCOUNTER — Ambulatory Visit: Payer: Medicare HMO | Admitting: Physician Assistant

## 2021-05-25 VITALS — BP 119/66 | HR 81 | Temp 97.7°F | Ht 70.0 in | Wt 195.0 lb

## 2021-05-25 DIAGNOSIS — L03011 Cellulitis of right finger: Secondary | ICD-10-CM | POA: Diagnosis not present

## 2021-05-25 MED ORDER — CEPHALEXIN 500 MG PO CAPS: 500.0000 mg | ORAL_CAPSULE | Freq: Two times a day (BID) | ORAL | 0 refills | Status: DC

## 2021-05-25 NOTE — Patient Instructions (Signed)
Paronychia Paronychia is an infection of the skin. It happens near a fingernail or toenail. It may cause pain and swelling around the nail. In some cases, a fluid-filled bump (abscess) can form near or under the nail. Usually, this condition is not serious, and it clears up with treatment. Follow these instructions at home: Wound care Keep the affected area clean. Soak the fingers or toes in warm water as told by your doctor. You may be told to do this for 20 minutes, 2-3 times a day. Keep the area dry when you are not soaking it. Do not try to drain a fluid-filled bump on your own. Follow instructions from your doctor about how to take care of the affected area. Make sure you: Wash your hands with soap and water before you change your bandage (dressing). If you cannot use soap and water, use hand sanitizer. Change your bandage as told by your doctor. If you had a fluid-filled bump and your doctor drained it, check the area every day for signs of infection. Check for: Redness, swelling, or pain. Fluid or blood. Warmth. Pus or a bad smell. Medicines  Take over-the-counter and prescription medicines only as told by your doctor. If you were prescribed an antibiotic medicine, take it as told by your doctor. Do not stop taking it even if you start to feel better.  General instructions Avoid touching any chemicals. Do not pick at the affected area. Prevention To prevent this condition from happening again: Wear rubber gloves when putting your hands in water for washing dishes or other tasks. Wear gloves if your hands might touch cleaners or chemicals. Avoid injuring your nails or fingertips. Do not bite your nails or tear hangnails. Do not cut your nails very short. Do not cut the skin at the base and sides of the nail (cuticles). Use clean nail clippers or scissors when trimming nails. Contact a doctor if: You feel worse. You do not get better. You have more fluid, blood, or pus  coming from the affected area. Your finger or knuckle is swollen or is hard to move. Get help right away if you have: A fever or chills. Redness spreading from the affected area. Pain in a joint or muscle. Summary Paronychia is an infection of the skin. It happens near a fingernail or toenail. This condition may cause pain and swelling around the nail. Soak the fingers or toes in warm water as told by your doctor. Usually, this condition is not serious, and it clears up with treatment. This information is not intended to replace advice given to you by your health care provider. Make sure you discuss any questions you have with your healthcare provider. Document Revised: 09/01/2020 Document Reviewed: 09/02/2020 Elsevier Patient Education  2022 [employer]  

## 2021-05-25 NOTE — Progress Notes (Signed)
  Subjective:     Patient ID: Johnny Foster, male   DOB: October 27, 1956, 65 y.o.   MRN: [id number]  Hand Pain   Pt with pain and swelling around the nail of the R middle finger No trauma to the site No hx of same  Review of Systems  Constitutional: Negative.   Musculoskeletal:  Positive for arthralgias.  Skin:  Positive for color change.      Objective:   Physical Exam Vitals and nursing note reviewed.   + erythema and edema to the lateral nail fold of the R middle finger + area of fluct No drainage noted FROM of the finger Offered drainage Pt consented Area cleansed with Betadine Small incision made Purulent material expressed Area cleansed and dressed    Assessment:     1. Paronychia, finger, right        Plan:     Nl course reviewed Wound care reviewed Keep clean and dry Keflex rx F/u prn

## 2021-08-03 ENCOUNTER — Ambulatory Visit: Payer: Medicare HMO | Admitting: Family Medicine

## 2021-08-03 ENCOUNTER — Encounter: Payer: Self-pay | Admitting: Family Medicine

## 2021-08-03 ENCOUNTER — Ambulatory Visit (INDEPENDENT_AMBULATORY_CARE_PROVIDER_SITE_OTHER): Payer: Medicare HMO | Admitting: Family Medicine

## 2021-08-03 ENCOUNTER — Other Ambulatory Visit: Payer: Self-pay

## 2021-08-03 VITALS — BP 94/60 | HR 65 | Temp 97.9°F | Ht 70.0 in | Wt 190.2 lb

## 2021-08-03 DIAGNOSIS — R1032 Left lower quadrant pain: Secondary | ICD-10-CM

## 2021-08-03 LAB — CBC WITH DIFFERENTIAL/PLATELET
Basophils Absolute: 0 10*3/uL (ref 0.0–0.2)
Basos: 1 %
EOS (ABSOLUTE): 0.2 10*3/uL (ref 0.0–0.4)
Eos: 3 %
Hematocrit: 43.1 % (ref 37.5–51.0)
Hemoglobin: 14.6 g/dL (ref 13.0–17.7)
Immature Grans (Abs): 0 10*3/uL (ref 0.0–0.1)
Immature Granulocytes: 0 %
Lymphocytes Absolute: 1.9 10*3/uL (ref 0.7–3.1)
Lymphs: 24 %
MCH: 31.7 pg (ref 26.6–33.0)
MCHC: 33.9 g/dL (ref 31.5–35.7)
MCV: 94 fL (ref 79–97)
Monocytes Absolute: 0.6 10*3/uL (ref 0.1–0.9)
Monocytes: 8 %
Neutrophils Absolute: 5.2 10*3/uL (ref 1.4–7.0)
Neutrophils: 64 %
Platelets: 285 10*3/uL (ref 150–450)
RBC: 4.61 x10E6/uL (ref 4.14–5.80)
RDW: 12.1 % (ref 11.6–15.4)
WBC: 8.1 10*3/uL (ref 3.4–10.8)

## 2021-08-03 LAB — URINALYSIS
Bilirubin, UA: NEGATIVE
Glucose, UA: NEGATIVE
Ketones, UA: NEGATIVE
Leukocytes,UA: NEGATIVE
Nitrite, UA: NEGATIVE
RBC, UA: NEGATIVE
Specific Gravity, UA: >1.03 — ABNORMAL HIGH (ref 1.005–1.030)
Urobilinogen, Ur: 0.2 mg/dL (ref 0.2–1.0)
pH, UA: 5.5 (ref 5.0–7.5)

## 2021-08-03 LAB — CMP14+EGFR
ALT: 30 IU/L (ref 0–44)
AST: 18 IU/L (ref 0–40)
Albumin/Globulin Ratio: 1.9 (ref 1.2–2.2)
Albumin: 4.1 g/dL (ref 3.8–4.8)
Alkaline Phosphatase: 72 IU/L (ref 44–121)
BUN/Creatinine Ratio: 14 (ref 10–24)
BUN: 12 mg/dL (ref 8–27)
Bilirubin Total: 0.2 mg/dL (ref 0.0–1.2)
CO2: 21 mmol/L (ref 20–29)
Calcium: 9.1 mg/dL (ref 8.6–10.2)
Chloride: 110 mmol/L — ABNORMAL HIGH (ref 96–106)
Creatinine, Ser: 0.87 mg/dL (ref 0.76–1.27)
Globulin, Total: 2.2 g/dL (ref 1.5–4.5)
Glucose: 112 mg/dL — ABNORMAL HIGH (ref 65–99)
Potassium: 4.6 mmol/L (ref 3.5–5.2)
Sodium: 145 mmol/L — ABNORMAL HIGH (ref 134–144)
Total Protein: 6.3 g/dL (ref 6.0–8.5)
eGFR: 97 mL/min/{1.73_m2} (ref >59)

## 2021-08-03 MED ORDER — METRONIDAZOLE 500 MG PO TABS: 500.0000 mg | ORAL_TABLET | Freq: Two times a day (BID) | ORAL | 0 refills | Status: DC

## 2021-08-03 MED ORDER — CIPROFLOXACIN HCL 500 MG PO TABS: 500.0000 mg | ORAL_TABLET | Freq: Two times a day (BID) | ORAL | 0 refills | Status: DC

## 2021-08-03 NOTE — Patient Instructions (Signed)
Diverticulitis Diverticulitis is infection or inflammation of small pouches (diverticula) in the colon that form due to a condition called diverticulosis. Diverticula can trap stool (feces) and bacteria, causing infection and inflammation. Diverticulitis may cause severe stomach pain and diarrhea. It may lead to tissue damage in the colon that causes bleeding or blockage. The diverticula may also burst (rupture) and cause infected stool to enter other areas of the abdomen. What are the causes? This condition is caused by stool becoming trapped in the diverticula, which allows bacteria to grow in the diverticula. This leads to inflammation and infection. What increases the risk? You are more likely to develop this condition if you have diverticulosis. The risk increases if you: Are overweight or obese. Do not get enough exercise. Drink alcohol. Use tobacco products. Eat a diet that has a lot of red meat such as beef, pork, or lamb. Eat a diet that does not include enough fiber. High-fiber foods include fruits, vegetables, beans, nuts, and whole grains. Are over 40 years of age. What are the signs or symptoms? Symptoms of this condition may include: Pain and tenderness in the abdomen. The pain is normally located on the left side of the abdomen, but it may occur in other areas. Fever and chills. Nausea. Vomiting. Cramping. Bloating. Changes in bowel routines. Blood in your stool. How is this diagnosed? This condition is diagnosed based on: Your medical history. A physical exam. Tests to make sure there is nothing else causing your condition. These tests may include: Blood tests. Urine tests. CT scan of the abdomen. How is this treated? Most cases of this condition are mild and can be treated at home. Treatment may include: Taking over-the-counter pain medicines. Following a clear liquid diet. Taking antibiotic medicines by mouth. Resting. More severe cases may need to be treated  at a hospital. Treatment may include: Not eating or drinking. Taking prescription pain medicine. Receiving antibiotic medicines through an IV. Receiving fluids and nutrition through an IV. Surgery. When your condition is under control, your health care provider may recommend that you have a colonoscopy. This is an exam to look at the entire large intestine. During the exam, a lubricated, bendable tube is inserted into the anus and then passed into the rectum, colon, and other parts of the large intestine. A colonoscopy can show how severe your diverticula are and whether something else may be causing your symptoms. Follow these instructions at home: Medicines Take over-the-counter and prescription medicines only as told by your health care provider. These include fiber supplements, probiotics, and stool softeners. If you were prescribed an antibiotic medicine, take it as told by your health care provider. Do not stop taking the antibiotic even if you start to feel better. Ask your health care provider if the medicine prescribed to you requires you to avoid driving or using machinery. Eating and drinking  Follow a full liquid diet or another diet as directed by your health care provider. After your symptoms improve, your health care provider may tell you to change your diet. He or she may recommend that you eat a diet that contains at least 25 grams (25 g) of fiber daily. Fiber makes it easier to pass stool. Healthy sources of fiber include: Berries. One cup contains 4-8 grams of fiber. Beans or lentils. One-half cup contains 5-8 grams of fiber. Green vegetables. One cup contains 4 grams of fiber. Avoid eating red meat. General instructions Do not use any products that contain nicotine or tobacco, such as cigarettes,   e-cigarettes, and chewing tobacco. If you need help quitting, ask your health care provider. Exercise for at least 30 minutes, 3 times each week. You should exercise hard enough to  raise your heart rate and break a sweat. Keep all follow-up visits as told by your health care provider. This is important. You may need to have a colonoscopy. Contact a health care provider if: Your pain does not improve. Your bowel movements do not return to normal. Get help right away if: Your pain gets worse. Your symptoms do not get better with treatment. Your symptoms suddenly get worse. You have a fever. You vomit more than one time. You have stools that are bloody, black, or tarry. Summary Diverticulitis is infection or inflammation of small pouches (diverticula) in the colon that form due to a condition called diverticulosis. Diverticula can trap stool (feces) and bacteria, causing infection and inflammation. You are at higher risk for this condition if you have diverticulosis and you eat a diet that does not include enough fiber. Most cases of this condition are mild and can be treated at home. More severe cases may need to be treated at a hospital. When your condition is under control, your health care provider may recommend that you have an exam called a colonoscopy. This exam can show how severe your diverticula are and whether something else may be causing your symptoms. Keep all follow-up visits as told by your health care provider. This is important. This information is not intended to replace advice given to you by your health care provider. Make sure you discuss any questions you have with your health care provider. Document Revised: 08/19/2019 Document Reviewed: 08/19/2019 Elsevier Patient Education  2022 Elsevier Inc.  

## 2021-08-03 NOTE — Progress Notes (Signed)
 Subjective:  Patient ID: Johnny Foster, male    DOB: 01/23/1956  Age: 65 y.o. MRN: [id number]  CC: Back Pain   HPI Heather Paola presents for onset 2 weeks ago with stomach pain and diarrhea. Felt better after a few days. For the last five days has had low back pain left lumbar area. 8-10/10 A little better today. Stomach still doesn't feel right. Having gas and burping. Stool dark today.   Depression screen PHQ 2/9 08/03/2021 05/25/2021 09/22/2020  Decreased Interest 0 0 0  Down, Depressed, Hopeless 0 0 0  PHQ - 2 Score 0 0 0  Altered sleeping - - -  Tired, decreased energy - - -  Change in appetite - - -  Feeling bad or failure about yourself  - - -  Trouble concentrating - - -  Moving slowly or fidgety/restless - - -  Suicidal thoughts - - -  PHQ-9 Score - - -    History John has a past medical history of Hepatitis C, Hyperlipidemia, Hypertension, Iron deficiency anemia (10/02/2018), Myocardial infarction (HCC), and Substance abuse (HCC).   He has a past surgical history that includes Tonsilectomy, adenoidectomy, bilateral myringotomy and tubes (1969).   His family history includes Arthritis in his mother; CAD (age of onset: 77) in his mother; Cancer in his brother; Lung disease in his father.He reports that he has been smoking cigarettes. He has a 10.00 pack-year smoking history. He has never used smokeless tobacco. He reports current alcohol use. He reports that he does not currently use drugs after having used the following drugs: Other-see comments.    ROS Review of Systems  Constitutional:  Negative for chills, diaphoresis, fever and unexpected weight change.  HENT:  Negative for rhinorrhea and trouble swallowing.   Respiratory:  Negative for cough, chest tightness and shortness of breath.   Cardiovascular:  Negative for chest pain.  Gastrointestinal:  Positive for abdominal pain, diarrhea and vomiting (once at onset). Negative for abdominal distention, blood in  stool, constipation, nausea and rectal pain.  Genitourinary:  Negative for dysuria, flank pain and hematuria.  Musculoskeletal:  Negative for arthralgias and joint swelling.  Skin:  Negative for rash.  Neurological:  Negative for syncope and headaches.   Objective:  BP 94/60   Pulse 65   Temp 97.9 F (36.6 C)   Ht 5' 10" (1.778 m)   Wt 190 lb 3.2 oz (86.3 kg)   SpO2 96%   BMI 27.29 kg/m   BP Readings from Last 3 Encounters:  08/03/21 94/60  05/25/21 119/66  10/16/20 129/84    Wt Readings from Last 3 Encounters:  08/03/21 190 lb 3.2 oz (86.3 kg)  05/25/21 195 lb (88.5 kg)  10/16/20 195 lb (88.5 kg)     Physical Exam Constitutional:      General: He is not in acute distress.    Appearance: He is well-developed.  HENT:     Head: Normocephalic and atraumatic.     Right Ear: External ear normal.     Left Ear: External ear normal.     Nose: Nose normal.  Eyes:     Conjunctiva/sclera: Conjunctivae normal.     Pupils: Pupils are equal, round, and reactive to light.  Cardiovascular:     Rate and Rhythm: Normal rate and regular rhythm.     Heart sounds: Normal heart sounds. No murmur heard. Pulmonary:     Effort: Pulmonary effort is normal. No respiratory distress.     Breath sounds: Normal breath   sounds. No wheezing or rales.  Abdominal:     Palpations: Abdomen is soft.     Tenderness: There is no abdominal tenderness.  Musculoskeletal:        General: Normal range of motion.     Cervical back: Normal range of motion and neck supple.  Skin:    General: Skin is warm and dry.  Neurological:     Mental Status: He is alert and oriented to person, place, and time.     Deep Tendon Reflexes: Reflexes are normal and symmetric.  Psychiatric:        Behavior: Behavior normal.        Thought Content: Thought content normal.        Judgment: Judgment normal.      Assessment & Plan:   Lemoyne was seen today for back pain.  Diagnoses and all orders for this  visit:  Left lower quadrant abdominal pain -     CBC with Differential/Platelet -     CMP14+EGFR -     Urinalysis -     Fecal occult blood, imunochemical; Future -     Ambulatory referral to Gastroenterology  Other orders -     metroNIDAZOLE (FLAGYL) 500 MG tablet; Take 1 tablet (500 mg total) by mouth 2 (two) times daily. -     ciprofloxacin (CIPRO) 500 MG tablet; Take 1 tablet (500 mg total) by mouth 2 (two) times daily.      I am having Alahna Dunne Danes start on metroNIDAZOLE and ciprofloxacin. I am also having him maintain his clotrimazole-betamethasone, nitroGLYCERIN, aspirin, atorvastatin, lisinopril, naproxen, metoprolol tartrate, and cephALEXin.  Allergies as of 08/03/2021   No Known Allergies      Medication List        Accurate as of August 03, 2021 10:00 PM. If you have any questions, ask your nurse or doctor.          aspirin 81 MG EC tablet Commonly known as: Aspirin Low Dose Take 1 tablet (81 mg total) by mouth daily. Swallow whole.   atorvastatin 80 MG tablet Commonly known as: LIPITOR TAKE 1 TABLET(80 MG) BY MOUTH DAILY   cephALEXin 500 MG capsule Commonly known as: KEFLEX Take 1 capsule (500 mg total) by mouth 2 (two) times daily.   ciprofloxacin 500 MG tablet Commonly known as: Cipro Take 1 tablet (500 mg total) by mouth 2 (two) times daily. Started by: Claretta Fraise, MD   clotrimazole-betamethasone cream Commonly known as: Lotrisone Apply 1 application topically 2 (two) times daily. To affected areas until rash clears   lisinopril 5 MG tablet Commonly known as: ZESTRIL TAKE 1 TABLET(5 MG) BY MOUTH DAILY   metoprolol tartrate 25 MG tablet Commonly known as: LOPRESSOR TAKE 1 TABLET(25 MG) BY MOUTH TWICE DAILY   metroNIDAZOLE 500 MG tablet Commonly known as: FLAGYL Take 1 tablet (500 mg total) by mouth 2 (two) times daily. Started by: Claretta Fraise, MD   naproxen 500 MG tablet Commonly known as: NAPROSYN Take 1 tablet (500 mg  total) by mouth 2 (two) times daily with a meal.   nitroGLYCERIN 0.4 MG SL tablet Commonly known as: NITROSTAT Place 1 tablet (0.4 mg total) under the tongue every 5 (five) minutes as needed for chest pain.         Follow-up: No follow-ups on file.  Claretta Fraise, M.D.

## 2021-08-04 NOTE — Progress Notes (Signed)
Hello Kelcy,  Your lab result is normal and/or stable.Some minor variations that are not significant are commonly marked abnormal, but do not represent any medical problem for you.  Best regards, Lauri Purdum, M.D.

## 2021-08-05 ENCOUNTER — Other Ambulatory Visit: Payer: Medicare HMO

## 2021-08-05 ENCOUNTER — Other Ambulatory Visit: Payer: Self-pay

## 2021-08-05 DIAGNOSIS — R1032 Left lower quadrant pain: Secondary | ICD-10-CM

## 2021-08-06 ENCOUNTER — Encounter: Payer: Self-pay | Admitting: Nurse Practitioner

## 2021-08-06 ENCOUNTER — Telehealth: Payer: Self-pay | Admitting: Family Medicine

## 2021-08-06 LAB — FECAL OCCULT BLOOD, IMMUNOCHEMICAL: Fecal Occult Bld: NEGATIVE

## 2021-08-07 NOTE — Telephone Encounter (Signed)
I'm sorry to say I can not speed up the referral

## 2021-08-09 NOTE — Telephone Encounter (Signed)
Patient aware.

## 2021-08-09 NOTE — Telephone Encounter (Signed)
I do not have a way to speed this up, but I can rearrange the referral elsewhere if desired.

## 2021-09-03 ENCOUNTER — Encounter: Payer: Self-pay | Admitting: Nurse Practitioner

## 2021-09-03 ENCOUNTER — Ambulatory Visit (INDEPENDENT_AMBULATORY_CARE_PROVIDER_SITE_OTHER): Payer: Medicare HMO | Admitting: Nurse Practitioner

## 2021-09-03 ENCOUNTER — Other Ambulatory Visit: Payer: Self-pay | Admitting: Family Medicine

## 2021-09-03 VITALS — BP 136/78 | HR 60 | Ht 70.0 in | Wt 193.1 lb

## 2021-09-03 DIAGNOSIS — I2510 Atherosclerotic heart disease of native coronary artery without angina pectoris: Secondary | ICD-10-CM

## 2021-09-03 DIAGNOSIS — R197 Diarrhea, unspecified: Secondary | ICD-10-CM | POA: Diagnosis not present

## 2021-09-03 DIAGNOSIS — R1084 Generalized abdominal pain: Secondary | ICD-10-CM | POA: Insufficient documentation

## 2021-09-03 DIAGNOSIS — R1032 Left lower quadrant pain: Secondary | ICD-10-CM | POA: Insufficient documentation

## 2021-09-03 NOTE — Progress Notes (Signed)
After discharge patient returned and stated he does not want to have a colonoscopy until after he talks to his cardiologist Appointment and referral canceled.

## 2021-09-03 NOTE — Progress Notes (Signed)
Agree with the assessment and plan as outlined by Alcide Evener, NP.  I do not think an EGD is necessary in the absence of any upper GI symptoms.  His IDA was an issue over 3 years ago and appears to have completely resolved.   Donne Baley E. Tomasa Rand, MD [facility] Gastroenterology

## 2021-09-03 NOTE — Patient Instructions (Addendum)
It has been recommended to you by your physician that you have a(n) Colonoscopy completed. Per your request, we did not schedule the procedure(s) today. Please contact our office at [phone] should you decide to have the procedure completed. You will be scheduled for a pre-visit and procedure at that time.   RECOMMENDATIONS: Schedule a follow up with cardiology.  It was great seeing you today! Thank you for entrusting me with your care and choosing [facility].  Arnaldo Natal, CRNP  The [facility] providers would like to encourage you to use [facility] to communicate with providers for non-urgent requests or questions.  Due to long hold times on the telephone, sending your provider a message by [facility] may be faster and more efficient way to get a response. Please allow 48 business hours for a response.  Please remember that this is for non-urgent requests/questions.  If you are age 11 or older, your body mass index should be between 23-30. Your Body mass index is 27.71 kg/m. If this is out of the aforementioned range listed, please consider follow up with your Primary Care Provider.  If you are age 103 or younger, your body mass index should be between 19-25. Your Body mass index is 27.71 kg/m. If this is out of the aformentioned range listed, please consider follow up with your Primary Care Provider.

## 2021-09-03 NOTE — Progress Notes (Signed)
09/03/2021 Johnny Foster [id number] 08-06-1956   CHIEF COMPLAINT: LLQ pain   HISTORY OF PRESENT ILLNESS: Johnny Foster is a 65 year old male with a past medical history of hypertension, CAD s/p MI and s/p stent placement  x 3 in 2019, IDA 2020, chronic hepatitis C (treated with Harvoni) 2015 with reported SVR. He presents to our office today as referred by Dr. Mechele Claude for further evaluation regarding LLQ pain.   He developed nausea with vomiting clear emesis x1 episode with the onset of nonbloody diarrhea with generalized to lower abdominal pain early September 2022.  He took a few doses of Pepto-Bismol and Imodium and his diarrhea abated. Two weeks later, he was evaluated by Dr. Mechele Claude 08/03/2021 and at that time the he had LLQ pain and lower back pain. He was treated with Cipro 500 mg p.o. twice daily and Flagyl 5 mg p.o. twice daily x 7 days for presumed diverticulitis.  He completed the course of Cipro and Flagyl and all of his abdominal pains abated.  He is passing normal formed brown bowel movement daily since then.  No rectal bleeding or black stools.  FOBT negative on 08/05/2021.  He has increased burping at times without heartburn.  Takes ASA 81 mg daily, no other NSAIDs.  He underwent a colonoscopy by a GI in [place] 02/2015 which was normal.  No known family history of colon polyps or colorectal cancer.  He was diagnosed with iron deficiency in 2019 while he was on Brilinta and ASA.  No overt GI bleeding.  He was seen by hematologist Dr. Melton Alar and he received IV iron x 22 September 2018.  No significant hematological disorder was identified and he was advised to follow-up with GI.  He was seen by [facility] and he was scheduled to have a colonoscopy with Dr. Karilyn Cota in 09/2018 but Brilinta and aspirin could not be stopped per cardiology since he had 3 stents placed in May and June 2019 therefore a colonoscopy was not done.  He stated his iron deficiency anemia resolved  after he received iron IV.  His most recent CBC was 08/03/2021 which showed a hemoglobin level of 14.6.  In regard to his cardiac status, he reports feeling great.  He denies having any chest pain, palpitations or shortness of breath.  He remains on ASA 81 mg daily, atorvastatin 80 mg daily, lisinopril 5 mg daily metoprolol 25 mg p.o. twice daily.  He last saw his cardiologist Dr. Antoine Poche 05/27/2020 and his cardiac status was stable at that time and he was advised to quit smoking and to follow-up in 1 year which was not done.  CBC Latest Ref Rng & Units 08/03/2021 09/22/2020 12/07/2019  WBC 3.4 - 10.8 x10E3/uL 8.1 10.2 9.5  Hemoglobin 13.0 - 17.7 g/dL 78.4 69.6 29.5  Hematocrit 37.5 - 51.0 % 43.1 48.5 49.6  Platelets 150 - 450 x10E3/uL 285 230 241    CMP Latest Ref Rng & Units 08/03/2021 09/22/2020 05/28/2020  Glucose 65 - 99 mg/dL 284(X) 67 -  BUN 8 - 27 mg/dL 12 12 -  Creatinine 3.24 - 1.27 mg/dL 4.01 0.27 -  Sodium 253 - 144 mmol/L 145(H) 141 -  Potassium 3.5 - 5.2 mmol/L 4.6 4.7 -  Chloride 96 - 106 mmol/L 110(H) 105 -  CO2 20 - 29 mmol/L 21 25 -  Calcium 8.6 - 10.2 mg/dL 9.1 9.8 -  Total Protein 6.0 - 8.5 g/dL 6.3 7.0 6.8  Total Bilirubin 0.0 -  1.2 mg/dL 0.2 0.3 0.4  Alkaline Phos 44 - 121 IU/L 72 76 76  AST 0 - 40 IU/L 18 17 18   ALT 0 - 44 IU/L 30 18 18     Colonoscopy 03/06/2015 by Dr. : Normal colonoscopy  No polyps Large internal hemorrhoids  Fair bowel prep  Past Medical History:  Diagnosis Date   Hepatitis C    Treated   Hyperlipidemia    Hypertension    Iron deficiency anemia 10/02/2018   Myocardial infarction [facility])    May 2019.  LAD moderate 60 to 80% stenosis.  Circumflex OM occluded.  RCA 80% stenosis.  OM was thought to be a acute infarct vessel.  It was treated with a Synergy stent.  This is a 2.5 x 20 mm, RCA was treated with a 3.  3.0 by 16 mm Synergy.  Elective PCI in June of an LAD lesion with 3.0 x 38 mm Synergy.   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  He has 2 daughters.  He is retired.  He smokes cigarettes age 14 - to present (stopped x 5 years at one point then went back to smoking). He drinks one beer daily or two if he is watching a baseball game. No drug use.   Family History: Father deceased with lung disease. Mother deceased heart disease. Sister with a form of cancer, further details unclear.   No Known Allergies   Outpatient Encounter Medications as of 09/03/2021  Medication Sig   aspirin (ASPIRIN LOW DOSE) 81 MG EC tablet Take 1 tablet (81 mg total) by mouth daily. Swallow whole.   atorvastatin (LIPITOR) 80 MG tablet TAKE 1 TABLET(80 MG) BY MOUTH DAILY   cephALEXin (KEFLEX) 500 MG capsule Take 1 capsule (500 mg total) by mouth 2 (two) times daily.   ciprofloxacin (CIPRO) 500 MG tablet Take 1 tablet (500 mg total) by mouth 2 (two) times daily.   clotrimazole-betamethasone (LOTRISONE) cream Apply 1 application topically 2 (two) times daily. To affected areas until rash clears   lisinopril (ZESTRIL) 5 MG tablet TAKE 1 TABLET(5 MG) BY MOUTH DAILY   metoprolol tartrate (LOPRESSOR) 25 MG tablet TAKE 1 TABLET(25 MG) BY MOUTH TWICE DAILY   metroNIDAZOLE (FLAGYL) 500 MG tablet Take 1 tablet (500 mg total) by mouth 2 (two) times daily.   naproxen (NAPROSYN) 500 MG tablet Take 1 tablet (500 mg total) by mouth 2 (two) times daily with a meal.   nitroGLYCERIN (NITROSTAT) 0.4 MG SL tablet Place 1 tablet (0.4 mg total) under the tongue every 5 (five) minutes as needed for chest pain.   No facility-administered encounter medications on file as of 09/03/2021.   REVIEW OF SYSTEMS:  Gen: Denies fever, sweats or chills. No weight loss.  CV: See HPI.  Denies chest pain, palpitations or edema. Resp: Denies cough, shortness of breath of hemoptysis.  GI: See  HPI.  GU : Denies urinary burning, blood in urine, increased urinary frequency. MS: Denies joint pain, muscles aches or weakness. Derm: Denies rash, itchiness, skin lesions or unhealing ulcers. Psych: Denies depression, anxiety or memory loss. Heme: Denies bruising, bleeding. Neuro:  Denies headaches, dizziness or paresthesias. Endo:  Denies any problems with DM, thyroid or adrenal function.  PHYSICAL EXAM: BP 136/78   Pulse 60   Ht 5\' 10"  (1.778  m)   Wt 193 lb 2 oz (87.6 kg)   BMI 27.71 kg/m   General: 65 year old male in NAD.  Head: Normocephalic and atraumatic. Eyes:  Sclerae non-icteric, conjunctive pink. Ears: Normal auditory acuity. Mouth: Dentition intact. No ulcers or lesions.  Neck: Supple, no lymphadenopathy or thyromegaly.  Lungs: Clear bilaterally to auscultation without wheezes, crackles or rhonchi. Heart: Regular rate and rhythm. No murmur, rub or gallop appreciated.  Abdomen: Soft, nontender, non distended. No masses. No hepatosplenomegaly. Normoactive bowel sounds x 4 quadrants.  Rectal: Deferred.  Musculoskeletal: Symmetrical with no gross deformities. Skin: Warm and dry. No rash or lesions on visible extremities. Extremities: No edema. Neurological: Alert oriented x 4, no focal deficits.  Psychological:  Alert and cooperative. Normal mood and affect.  ASSESSMENT AND PLAN:  20) 65 year old male with LLQ pain, presumed diverticulitis which resolved after he took a course of Cipro and Flagyl by his PCP.  I suspect he had gastroenteritis with initial presenting symptoms including N/V/D and lower abdominal pain. Colonoscopy done by [facility] [place] [facility] 02/2015 without diverticulosis or polyps, fair prep noted. -Colonoscopy to rule out colorectal malignancy benefits and risks discussed including risk with sedation, risk of bleeding, perforation and infection  -Patient prefers to see his cardiologist for his annual review prior to scheduling a colonoscopy which is absolutely  reasonable -Patient will call our office when he is ready to schedule his colonoscopy  2) History of IDA 2019 while on DAPT s/p DES x 3 with negative FOBT.  No overt GI bleeding.  He received IV iron x two 09/2018 per hematology.  A colonoscopy with Dr. Marline Backbone was scheduled 09/2018 but was later canceled as the patient was on Brilinta and aspirin which could not be held due s/p stent placement x 3. Colonoscopy 02/2015 by [place] [facility] showed large internal hemorrhoids, no polyps with a fair bowel prep. IDA resolved. Hg 14.6.  -Colonoscopy as ordered above -?  EGD at time of of colonoscopy to rule out PUD or indolent upper GI malignancy as the etiology for his IDA was never identified.  Await further recommendations per Dr. Tomasa Rand.  3) CAD, MI s/p DES x 22 Mar 2018 and x 21 April 2018. On ASA.   4) History of chronic hepatitis C treated with Harvoni with reported SVR by the patient in 2015.  Normal LFTs on 08/03/2021:          CC:  Mechele Claude, MD

## 2021-09-17 ENCOUNTER — Other Ambulatory Visit: Payer: Self-pay | Admitting: Family Medicine

## 2021-09-20 ENCOUNTER — Ambulatory Visit: Payer: Medicare HMO | Admitting: Family Medicine

## 2021-09-21 ENCOUNTER — Telehealth: Payer: Self-pay | Admitting: Family Medicine

## 2021-09-21 NOTE — Telephone Encounter (Signed)
He doesn't need a new referral. He can call the GI office and ask for an appointment after Jan. 4.

## 2021-09-21 NOTE — Telephone Encounter (Signed)
Patient was to follow up with his cardiologist, Dr. Antoine Poche, in July for his yearly appointment but he failed to do so.  He has spoken to the GI office and explained to them that he would like to see cardiologist before he does colonoscopy.  He has an upcoming appointment with Dr. Antoine Poche on 11/24/21 and will let us know afterwards about another referral for his colonoscopy.

## 2021-09-21 NOTE — Telephone Encounter (Signed)
See what his questions are. Generally, the GI doctor will meet with him first to answer his questions at a consult before scheduling the procedure.

## 2021-09-21 NOTE — Telephone Encounter (Signed)
Wanted to speak with someone about his questions/concerns before scheduling his Colonoscopy

## 2021-09-22 NOTE — Telephone Encounter (Signed)
Patient aware.

## 2021-09-27 ENCOUNTER — Ambulatory Visit (INDEPENDENT_AMBULATORY_CARE_PROVIDER_SITE_OTHER): Payer: Medicare HMO

## 2021-09-27 VITALS — Ht 70.0 in | Wt 193.0 lb

## 2021-09-27 DIAGNOSIS — Z0000 Encounter for general adult medical examination without abnormal findings: Secondary | ICD-10-CM | POA: Diagnosis not present

## 2021-09-27 DIAGNOSIS — Z720 Tobacco use: Secondary | ICD-10-CM | POA: Diagnosis not present

## 2021-09-27 NOTE — Patient Instructions (Signed)
Johnny Foster , Thank you for taking time to come for your Medicare Wellness Visit. I appreciate your ongoing commitment to your health goals. Please review the following plan we discussed and let me know if I can assist you in the future.   Screening recommendations/referrals: Colonoscopy: Done 03/06/2015 Recommended yearly ophthalmology/optometry visit for glaucoma screening and checkup Recommended yearly dental visit for hygiene and checkup  Vaccinations: Influenza vaccine: Due Repeat annually  Pneumococcal vaccine: Not due until age 23. Tdap vaccine: Due. Repeat in 10 years  Shingles vaccine: Shingrix discussed. Please contact your pharmacy for coverage information.     Covid-19: Done 12/06/2019, 01/06/2020 and 11/04/2020   Advanced directives: Please bring a copy of your health care power of attorney and living will to the office to be added to your chart at your convenience.   Conditions/risks identified: If you wish to quit smoking, help is available. For free tobacco cessation program offerings call the [facility] at 773-468-5389 or Live Well Line at (843)283-1354. You may also visit [email or web address] or email [email or web address] for more information on other programs.   You may also call 1-800-QUIT-NOW ((612) 776-3638) or visit www.[place].ch or www.[email or web address] for additional resources on smoking cessation.    Next appointment: Follow up in one year for your annual wellness visit 2023.   Preventive Care 40-64 Years, Male Preventive care refers to lifestyle choices and visits with your health care provider that can promote health and wellness. What does preventive care include? A yearly physical exam. This is also called an annual well check. Dental exams once or twice a year. Routine eye exams. Ask your health care provider how often you should have your eyes checked. Personal lifestyle choices, including: Daily care of your teeth and  gums. Regular physical activity. Eating a healthy diet. Avoiding tobacco and drug use. Limiting alcohol use. Practicing safe sex. Taking low-dose aspirin every day starting at age 82. What happens during an annual well check? The services and screenings done by your health care provider during your annual well check will depend on your age, overall health, lifestyle risk factors, and family history of disease. Counseling  Your health care provider may ask you questions about your: Alcohol use. Tobacco use. Drug use. Emotional well-being. Home and relationship well-being. Sexual activity. Eating habits. Work and work Astronomer. Screening  You may have the following tests or measurements: Height, weight, and BMI. Blood pressure. Lipid and cholesterol levels. These may be checked every 5 years, or more frequently if you are over 81 years old. Skin check. Lung cancer screening. You may have this screening every year starting at age 5 if you have a 30-pack-year history of smoking and currently smoke or have quit within the past 15 years. Fecal occult blood test (FOBT) of the stool. You may have this test every year starting at age 15. Flexible sigmoidoscopy or colonoscopy. You may have a sigmoidoscopy every 5 years or a colonoscopy every 10 years starting at age 40. Prostate cancer screening. Recommendations will vary depending on your family history and other risks. Hepatitis C blood test. Hepatitis B blood test. Sexually transmitted disease (STD) testing. Diabetes screening. This is done by checking your blood sugar (glucose) after you have not eaten for a while (fasting). You may have this done every 1-3 years. Discuss your test results, treatment options, and if necessary, the need for more tests with your health care provider. Vaccines  Your health care provider may recommend certain vaccines, such as: Influenza vaccine.  This is recommended every year. Tetanus, diphtheria, and  acellular pertussis (Tdap, Td) vaccine. You may need a Td booster every 10 years. Zoster vaccine. You may need this after age 52. Pneumococcal 13-valent conjugate (PCV13) vaccine. You may need this if you have certain conditions and have not been vaccinated. Pneumococcal polysaccharide (PPSV23) vaccine. You may need one or two doses if you smoke cigarettes or if you have certain conditions. Talk to your health care provider about which screenings and vaccines you need and how often you need them. This information is not intended to replace advice given to you by your health care provider. Make sure you discuss any questions you have with your health care provider. Document Released: 12/04/2015 Document Revised: 07/27/2016 Document Reviewed: 09/08/2015 Elsevier Interactive Patient Education  2017 [employer].  Fall Prevention in the Home Falls can cause injuries. They can happen to people of all ages. There are many things you can do to make your home safe and to help prevent falls. What can I do on the outside of my home? Regularly fix the edges of walkways and driveways and fix any cracks. Remove anything that might make you trip as you walk through a door, such as a raised step or threshold. Trim any bushes or trees on the path to your home. Use bright outdoor lighting. Clear any walking paths of anything that might make someone trip, such as rocks or tools. Regularly check to see if handrails are loose or broken. Make sure that both sides of any steps have handrails. Any raised decks and porches should have guardrails on the edges. Have any leaves, snow, or ice cleared regularly. Use sand or salt on walking paths during winter. Clean up any spills in your garage right away. This includes oil or grease spills. What can I do in the bathroom? Use night lights. Install grab bars by the toilet and in the tub and shower. Do not use towel bars as grab bars. Use non-skid mats or decals in the  tub or shower. If you need to sit down in the shower, use a plastic, non-slip stool. Keep the floor dry. Clean up any water that spills on the floor as soon as it happens. Remove soap buildup in the tub or shower regularly. Attach bath mats securely with double-sided non-slip rug tape. Do not have throw rugs and other things on the floor that can make you trip. What can I do in the bedroom? Use night lights. Make sure that you have a light by your bed that is easy to reach. Do not use any sheets or blankets that are too big for your bed. They should not hang down onto the floor. Have a firm chair that has side arms. You can use this for support while you get dressed. Do not have throw rugs and other things on the floor that can make you trip. What can I do in the kitchen? Clean up any spills right away. Avoid walking on wet floors. Keep items that you use a lot in easy-to-reach places. If you need to reach something above you, use a strong step stool that has a grab bar. Keep electrical cords out of the way. Do not use floor polish or wax that makes floors slippery. If you must use wax, use non-skid floor wax. Do not have throw rugs and other things on the floor that can make you trip. What can I do with my stairs? Do not leave any items on the stairs.  Make sure that there are handrails on both sides of the stairs and use them. Fix handrails that are broken or loose. Make sure that handrails are as long as the stairways. Check any carpeting to make sure that it is firmly attached to the stairs. Fix any carpet that is loose or worn. Avoid having throw rugs at the top or bottom of the stairs. If you do have throw rugs, attach them to the floor with carpet tape. Make sure that you have a light switch at the top of the stairs and the bottom of the stairs. If you do not have them, ask someone to add them for you. What else can I do to help prevent falls? Wear shoes that: Do not have high  heels. Have rubber bottoms. Are comfortable and fit you well. Are closed at the toe. Do not wear sandals. If you use a stepladder: Make sure that it is fully opened. Do not climb a closed stepladder. Make sure that both sides of the stepladder are locked into place. Ask someone to hold it for you, if possible. Clearly mark and make sure that you can see: Any grab bars or handrails. First and last steps. Where the edge of each step is. Use tools that help you move around (mobility aids) if they are needed. These include: Canes. Walkers. Scooters. Crutches. Turn on the lights when you go into a dark area. Replace any light bulbs as soon as they burn out. Set up your furniture so you have a clear path. Avoid moving your furniture around. If any of your floors are uneven, fix them. If there are any pets around you, be aware of where they are. Review your medicines with your doctor. Some medicines can make you feel dizzy. This can increase your chance of falling. Ask your doctor what other things that you can do to help prevent falls. This information is not intended to replace advice given to you by your health care provider. Make sure you discuss any questions you have with your health care provider. Document Released: 09/03/2009 Document Revised: 04/14/2016 Document Reviewed: 12/12/2014 Elsevier Interactive Patient Education  2017 [employer].

## 2021-09-27 NOTE — Progress Notes (Signed)
Subjective:   Johnny Foster is a 65 y.o. male who presents for Medicare Annual/Subsequent preventive examination. Virtual Visit via Telephone Note  I connected with  Johnny Foster on 09/27/21 at  8:15 AM EST by telephone and verified that I am speaking with the correct person using two identifiers.  Location: Patient: Home Provider: [facility] Persons participating in the virtual visit: patient/Nurse Health Advisor   I discussed the limitations, risks, security and privacy concerns of performing an evaluation and management service by telephone and the availability of in person appointments. The patient expressed understanding and agreed to proceed.  Interactive audio and video telecommunications were attempted between this nurse and patient, however failed, due to patient having technical difficulties OR patient did not have access to video capability.  We continued and completed visit with audio only.  Some vital signs may be absent or patient reported.   Darral Dash, LPN  Review of Systems     Cardiac Risk Factors include: advanced age (>12men, >69 women);hypertension;dyslipidemia;male gender;sedentary lifestyle;smoking/ tobacco exposure     Objective:    Today's Vitals   09/27/21 0812  Weight: 193 lb (87.5 kg)  Height: 5\' 10"  (1.778 m)   Body mass index is 27.69 kg/m.  Advanced Directives 09/27/2021 10/16/2020 12/07/2019 06/11/2019 03/08/2019 11/12/2018 10/12/2018  Does Patient Have a Medical Advance Directive? Yes No No No No Yes Yes  Type of Advance Directive Living will - - - - 10/14/2018  Does patient want to make changes to medical advance directive? - - - - - No - Patient declined No - Patient declined  Copy of Healthcare Power of Attorney in Chart? - - - - - No - copy requested No - copy requested  Would patient like information on creating a medical advance directive? No - Patient declined No - Patient declined No  - Patient declined - No - Patient declined - -    Current Medications (verified) Outpatient Encounter Medications as of 09/27/2021  Medication Sig   aspirin (ASPIRIN LOW DOSE) 81 MG EC tablet Take 1 tablet (81 mg total) by mouth daily. Swallow whole (NEEDS TO BE SEEN BEFORE NEXT REFILL)   atorvastatin (LIPITOR) 80 MG tablet TAKE 1 TABLET(80 MG) BY MOUTH DAILY (NEEDS TO BE SEEN BEFORE NEXT REFILL)   lisinopril (ZESTRIL) 5 MG tablet TAKE 1 TABLET(5 MG) BY MOUTH DAILY (NEEDS TO BE SEEN BEFORE NEXT REFILL)   metoprolol tartrate (LOPRESSOR) 25 MG tablet TAKE 1 TABLET(25 MG) BY MOUTH TWICE DAILY (NEEDS TO BE SEEN BEFORE NEXT REFILL)   nitroGLYCERIN (NITROSTAT) 0.4 MG SL tablet ONE TABLET UNDER TONGUE AS NEEDED FOR CHEST PAIN   cephALEXin (KEFLEX) 500 MG capsule Take 1 capsule (500 mg total) by mouth 2 (two) times daily. (Patient not taking: Reported on 09/27/2021)   ciprofloxacin (CIPRO) 500 MG tablet Take 1 tablet (500 mg total) by mouth 2 (two) times daily. (Patient not taking: Reported on 09/27/2021)   No facility-administered encounter medications on file as of 09/27/2021.    Allergies (verified) Patient has no known allergies.   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  Circumflex OM occluded.  RCA 80% stenosis.  OM was thought to be a acute infarct vessel.  It was treated  with a Synergy stent.  This is a 2.5 x 20 mm, RCA was treated with a 3.  3.0 by 16 mm Synergy.  Elective PCI in June of an LAD lesion with 3.0 x 38 mm Synergy.   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   Past Surgical History:  Procedure Laterality Date   CARDIAC VALVE SURGERY     TONSILECTOMY, ADENOIDECTOMY, BILATERAL MYRINGOTOMY AND TUBES  1969   Family History  Problem Relation Age of Onset   Arthritis Mother     CAD Mother 51   Lung disease Father    Cancer Brother    Social History   Socioeconomic History   Marital status: Married    Spouse name: Not on file   Number of children: 2   Years of education: Not on file   Highest education level: Not on file  Occupational History   Occupation: retired  Tobacco Use   Smoking status: Every Day    Packs/day: 0.25    Years: 40.00    Pack years: 10.00    Types: Cigarettes   Smokeless tobacco: Never  Vaping Use   Vaping Use: Never used  Substance and Sexual Activity   Alcohol use: Yes    Comment: rarely   Drug use: Not Currently    Types: Other-see comments    Comment: history of Heroin and Cocaine use in his teens    Sexual activity: Yes  Other Topics Concern   Not on file  Social History Narrative   2 daughters and 5 grandchildren   Social Determinants of Health   Financial Resource Strain: Low Risk    Difficulty of Paying Living Expenses: Not hard at all  Food Insecurity: No Food Insecurity   Worried About Programme researcher, broadcasting/film/video in the Last Year: Never true   Barista in the Last Year: Never true  Transportation Needs: No Transportation Needs   Lack of Transportation (Medical): No   Lack of Transportation (Non-Medical): No  Physical Activity: Insufficiently Active   Days of Exercise per Week: 5 days   Minutes of Exercise per Session: 20 min  Stress: No Stress Concern Present   Feeling of Stress : Not at all  Social Connections: Socially Integrated   Frequency of Communication with Friends and Family: More than three times a week   Frequency of Social Gatherings with Friends and Family: More than three times a week   Attends Religious Services: 1 to 4 times per year   Active Member of [employer] or Organizations: Yes   Attends Banker Meetings: 1 to 4 times per year   Marital Status: Married    Tobacco Counseling Ready to quit: Not Answered Counseling given: Not Answered   Clinical Intake:  Pre-visit  preparation completed: Yes  Pain : No/denies pain     BMI - recorded: 27.69 Nutritional Status: BMI 25 -29 Overweight Nutritional Risks: None Diabetes: No  How often do you need to have someone help you when you read instructions, pamphlets, or other written materials from your doctor or pharmacy?: 1 - Never  Diabetic?No  Interpreter Needed?: No  Information entered by :: MJ Kalin Amrhein, LPN   Activities of Daily Living In your present state of health, do you have any difficulty performing the following activities: 09/27/2021  Hearing? N  Vision? N  Difficulty concentrating or making decisions? N  Walking or climbing stairs? N  Dressing or bathing? N  Doing errands,  shopping? N  Preparing Food and eating ? N  Using the Toilet? N  In the past six months, have you accidently leaked urine? N  Do you have problems with loss of bowel control? N  Managing your Medications? N  Managing your Finances? N  Housekeeping or managing your Housekeeping? N  Some recent data might be hidden    Patient Care Team: Mechele Claude, MD as PCP - General (Family Medicine) Rollene Rotunda, MD as PCP - Cardiology (Cardiology)  Indicate any recent Medical Services you may have received from other than [facility] providers in the past year (date may be approximate).     Assessment:   This is a routine wellness examination for Johnny Foster.  Hearing/Vision screen Hearing Screening - Comments:: No hearing issues.  Vision Screening - Comments:: Readers. No eye MD. Declines referral.  Dietary issues and exercise activities discussed: Current Exercise Habits: Home exercise routine, Type of exercise: walking, Time (Minutes): 20, Frequency (Times/Week): 5, Weekly Exercise (Minutes/Week): 100, Intensity: Mild, Exercise limited by: cardiac condition(s)   Goals Addressed             This Visit's Progress    Quit Smoking   Not on track      Depression Screen PHQ 2/9 Scores 09/27/2021 08/03/2021 05/25/2021  09/22/2020 08/24/2020 05/14/2020 10/03/2019  PHQ - 2 Score 0 0 0 0 0 0 0  PHQ- 9 Score - - - - - - -    Fall Risk Fall Risk  09/27/2021 08/03/2021 05/25/2021 08/24/2020 10/03/2019  Falls in the past year? 0 0 0 0 0  Number falls in past yr: 0 - - - -  Injury with Fall? 0 - - - -  Risk for fall due to : No Fall Risks - - - -  Follow up Falls prevention discussed - - Falls evaluation completed -    FALL RISK PREVENTION PERTAINING TO THE HOME:  Any stairs in or around the home? Yes  If so, are there any without handrails? No  Home free of loose throw rugs in walkways, pet beds, electrical cords, etc? Yes  Adequate lighting in your home to reduce risk of falls? Yes   ASSISTIVE DEVICES UTILIZED TO PREVENT FALLS:  Life alert? No  Use of a cane, walker or w/c? No  Grab bars in the bathroom? Yes  Shower chair or bench in shower? No  Elevated toilet seat or a handicapped toilet? No   TIMED UP AND GO:  Was the test performed? No . Phone visit.    Cognitive Function: Normal cognitive status assessed by direct observation by this Nurse Health Advisor. No abnormalities found.          Immunizations Immunization History  Administered Date(s) Administered   Influenza,inj,Quad PF,6+ Mos 09/05/2018, 08/02/2019, 08/24/2020   Moderna Covid-19 Vaccine Bivalent Booster 72yrs & up 11/04/2020   Moderna Sars-Covid-2 Vaccination 12/06/2019, 01/06/2020    TDAP status: Due, Education has been provided regarding the importance of this vaccine. Advised may receive this vaccine at local pharmacy or Health Dept. Aware to provide a copy of the vaccination record if obtained from local pharmacy or Health Dept. Verbalized acceptance and understanding.  Flu Vaccine status: Due, Education has been provided regarding the importance of this vaccine. Advised may receive this vaccine at local pharmacy or Health Dept. Aware to provide a copy of the vaccination record if obtained from local pharmacy or Health Dept.  Verbalized acceptance and understanding.  Pneumococcal vaccine status: Due, Education has been provided regarding  the importance of this vaccine. Advised may receive this vaccine at local pharmacy or Health Dept. Aware to provide a copy of the vaccination record if obtained from local pharmacy or Health Dept. Verbalized acceptance and understanding.  Covid-19 vaccine status: Information provided on how to obtain vaccines.   Qualifies for Shingles Vaccine? Yes   Zostavax completed No   Shingrix Completed?: No.    Education has been provided regarding the importance of this vaccine. Patient has been advised to call insurance company to determine out of pocket expense if they have not yet received this vaccine. Advised may also receive vaccine at local pharmacy or Health Dept. Verbalized acceptance and understanding.  Screening Tests Health Maintenance  Topic Date Due   Pneumococcal Vaccine 41-90 Years old (1 - PCV) Never done   TETANUS/TDAP  Never done   Zoster Vaccines- Shingrix (1 of 2) Never done   COVID-19 Vaccine (3 - Moderna risk series) 11/04/2020   INFLUENZA VACCINE  06/21/2021   COLONOSCOPY (Pts 45-62yrs Insurance coverage will need to be confirmed)  03/05/2025   Hepatitis C Screening  Completed   HIV Screening  Completed   HPV VACCINES  Aged Out    Health Maintenance  Health Maintenance Due  Topic Date Due   Pneumococcal Vaccine 21-32 Years old (1 - PCV) Never done   TETANUS/TDAP  Never done   Zoster Vaccines- Shingrix (1 of 2) Never done   COVID-19 Vaccine (3 - Moderna risk series) 11/04/2020   INFLUENZA VACCINE  06/21/2021    Colorectal cancer screening: Type of screening: Colonoscopy. Completed 03/06/2015. Repeat every 10 years  Lung Cancer Screening: (Low Dose CT Chest recommended if Age 53-80 years, 30 pack-year currently smoking OR have quit w/in 15years.) does qualify.   Lung Cancer Screening Referral: Order placed for 12/2021.  Additional Screening:  Hepatitis  C Screening: does not qualify.  Vision Screening: Recommended annual ophthalmology exams for early detection of glaucoma and other disorders of the eye. Is the patient up to date with their annual eye exam?  No  Who is the provider or what is the name of the office in which the patient attends annual eye exams? N/A If pt is not established with a provider, would they like to be referred to a provider to establish care? No .   Dental Screening: Recommended annual dental exams for proper oral hygiene  Community Resource Referral / Chronic Care Management: CRR required this visit?  No   CCM required this visit?  No      Plan:     I have personally reviewed and noted the following in the patient's chart:   Medical and social history Use of alcohol, tobacco or illicit drugs  Current medications and supplements including opioid prescriptions. Patient is not currently taking opioid prescriptions. Functional ability and status Nutritional status Physical activity Advanced directives List of other physicians Hospitalizations, surgeries, and ER visits in previous 12 months Vitals Screenings to include cognitive, depression, and falls Referrals and appointments  In addition, I have reviewed and discussed with patient certain preventive protocols, quality metrics, and best practice recommendations. A written personalized care plan for preventive services as well as general preventive health recommendations were provided to patient.     Darral Dash, LPN   [id number]/04/[id number]   Nurse Notes: Lung Ca screening ordered for 12/2021. Up to date on Health Maintenance. Has had Covid vaccines. Plans to get flu vaccine at Cardiologist visit in next week. Shingles discusses.

## 2021-10-04 ENCOUNTER — Ambulatory Visit (INDEPENDENT_AMBULATORY_CARE_PROVIDER_SITE_OTHER): Payer: Medicare HMO

## 2021-10-04 ENCOUNTER — Other Ambulatory Visit: Payer: Self-pay

## 2021-10-04 DIAGNOSIS — Z23 Encounter for immunization: Secondary | ICD-10-CM

## 2021-10-06 ENCOUNTER — Ambulatory Visit (INDEPENDENT_AMBULATORY_CARE_PROVIDER_SITE_OTHER): Payer: Medicare HMO | Admitting: Family Medicine

## 2021-10-06 ENCOUNTER — Encounter: Payer: Self-pay | Admitting: Family Medicine

## 2021-10-06 ENCOUNTER — Other Ambulatory Visit: Payer: Self-pay

## 2021-10-06 VITALS — BP 120/69 | HR 67 | Temp 97.6°F | Ht 70.0 in | Wt 194.2 lb

## 2021-10-06 DIAGNOSIS — M25511 Pain in right shoulder: Secondary | ICD-10-CM | POA: Diagnosis not present

## 2021-10-06 MED ORDER — PREDNISONE 10 MG PO TABS: ORAL_TABLET | ORAL | 0 refills | Status: DC

## 2021-10-06 NOTE — Patient Instructions (Signed)
Ice the shoulder for 15 minutes at bedtime for the next few weeks. Avoid OTC antiinflammatory medications. These are Aleve and Advil, and all the similar store brands.

## 2021-10-06 NOTE — Progress Notes (Signed)
Subjective:  Patient ID: Johnny Foster, Uses advil and is able to get back to sleep.     DOB: 1956-06-04  Age: 65 y.o. MRN: [id number]  CC: Shoulder Pain   HPI Jazir Newey presents for right shoulder pain. Shooting pain at night awakens him every night. Occurs when not active. Onset a month ago. NKI. 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   Depression screen [facility] 2/9 10/06/2021 09/27/2021 08/03/2021  Decreased Interest 0 0 0  Down, Depressed, Hopeless 0 0 0  PHQ - 2 Score 0 0 0  Altered sleeping - - -  Tired, decreased energy - - -  Change in appetite - - -  Feeling bad or failure about yourself  - - -  Trouble concentrating - - -  Moving slowly or fidgety/restless - - -  Suicidal thoughts - - -  PHQ-9 Score - - -    History Johnny Foster has a past medical history of Hepatitis C, Hyperlipidemia, Hypertension, Iron deficiency anemia (10/02/2018), Myocardial infarction (HCC), and Substance abuse (HCC).   He has a past surgical history that includes Tonsilectomy, adenoidectomy, bilateral myringotomy and tubes (1969) and Cardiac valuve replacement.   His family history includes Arthritis in his mother; CAD (age of onset: 3) in his mother; Cancer in his brother; Lung disease in his father.He reports that he has been smoking cigarettes. He has a 10.00 pack-year smoking history. He has never used smokeless tobacco. He reports current alcohol use. He reports that he does not currently use drugs after having used the following drugs: Other-see comments.    ROS Review of Systems Noncontrib, except as in HPI Objective:  BP 120/69   Pulse 67   Temp 97.6 F (36.4 C)   Ht 5\' 10"  (1.778 m)   Wt 194 lb 3.2 oz (88.1 kg)   SpO2 98%   BMI 27.86 kg/m   BP Readings from Last 3 Encounters:  10/06/21 120/69  09/03/21 136/78  08/03/21 94/60    Wt Readings from Last 3 Encounters:  10/06/21 194 lb 3.2 oz (88.1 kg)  09/27/21 193 lb (87.5 kg)   09/03/21 193 lb 2 oz (87.6 kg)     Physical Exam Vitals reviewed.  Constitutional:      Appearance: He is well-developed.  HENT:     Head: Normocephalic and atraumatic.     Right Ear: External ear normal.     Left Ear: External ear normal.     Mouth/Throat:     Pharynx: No oropharyngeal exudate or posterior oropharyngeal erythema.  Eyes:     Pupils: Pupils are equal, round, and reactive to light.  Cardiovascular:     Rate and Rhythm: Normal rate and regular rhythm.     Heart sounds: No murmur heard. Pulmonary:     Effort: No respiratory distress.     Breath sounds: Normal breath sounds.  Musculoskeletal:     Cervical back: Normal range of motion and neck supple.  Neurological:     Mental Status: He is alert and oriented to person, place, and time.      Assessment & Plan:   Johnny Foster was seen today for shoulder pain.  Diagnoses and all orders for this visit:  Acute pain of right shoulder -     Ambulatory referral to Physical Therapy  Other orders -     predniSONE (DELTASONE) 10 MG tablet; Take 5 daily for 2 days followed by 4,3,2 and 1 for  2 days each.      I am having Johnny Foster start on predniSONE. I am also having him maintain his cephALEXin, ciprofloxacin, metoprolol tartrate, atorvastatin, lisinopril, aspirin, and nitroGLYCERIN.  Allergies as of 10/06/2021   No Known Allergies      Medication List        Accurate as of October 06, 2021 12:19 PM. If you have any questions, ask your nurse or doctor.          aspirin 81 MG EC tablet Commonly known as: Aspirin Low Dose Take 1 tablet (81 mg total) by mouth daily. Swallow whole (NEEDS TO BE SEEN BEFORE NEXT REFILL)   atorvastatin 80 MG tablet Commonly known as: LIPITOR TAKE 1 TABLET(80 MG) BY MOUTH DAILY (NEEDS TO BE SEEN BEFORE NEXT REFILL)   cephALEXin 500 MG capsule Commonly known as: KEFLEX Take 1 capsule (500 mg total) by mouth 2 (two) times daily.   ciprofloxacin 500 MG  tablet Commonly known as: Cipro Take 1 tablet (500 mg total) by mouth 2 (two) times daily.   lisinopril 5 MG tablet Commonly known as: ZESTRIL TAKE 1 TABLET(5 MG) BY MOUTH DAILY (NEEDS TO BE SEEN BEFORE NEXT REFILL)   metoprolol tartrate 25 MG tablet Commonly known as: LOPRESSOR TAKE 1 TABLET(25 MG) BY MOUTH TWICE DAILY (NEEDS TO BE SEEN BEFORE NEXT REFILL)   nitroGLYCERIN 0.4 MG SL tablet Commonly known as: NITROSTAT ONE TABLET UNDER TONGUE AS NEEDED FOR CHEST PAIN   predniSONE 10 MG tablet Commonly known as: DELTASONE Take 5 daily for 2 days followed by 4,3,2 and 1 for 2 days each. Started by: Mechele Claude, MD         Follow-up: No follow-ups on file.  Mechele Claude, M.D.

## 2021-10-19 ENCOUNTER — Encounter: Payer: Medicare HMO | Admitting: Gastroenterology

## 2021-10-19 ENCOUNTER — Other Ambulatory Visit: Payer: Self-pay | Admitting: Family Medicine

## 2021-10-20 ENCOUNTER — Telehealth: Payer: Self-pay | Admitting: Family Medicine

## 2021-10-20 ENCOUNTER — Other Ambulatory Visit: Payer: Self-pay | Admitting: *Deleted

## 2021-10-20 ENCOUNTER — Encounter: Payer: Self-pay | Admitting: Family Medicine

## 2021-10-20 MED ORDER — LISINOPRIL 5 MG PO TABS: ORAL_TABLET | ORAL | 0 refills | Status: DC

## 2021-10-20 NOTE — Telephone Encounter (Signed)
Pt upset because he has to make appointment. He is going to call pharmacy. I told him he needed appt to get meds refilled again

## 2021-10-20 NOTE — Telephone Encounter (Signed)
Refill sent as reqested

## 2021-10-20 NOTE — Telephone Encounter (Signed)
Stacks NTBS 30 days given 09/03/21

## 2021-10-20 NOTE — Telephone Encounter (Signed)
SENT LETTER

## 2021-10-20 NOTE — Telephone Encounter (Signed)
  Prescription Request  10/20/2021   What is the name of the medication or equipment? Lisinopril  Have you contacted your pharmacy to request a refill? Yes  Which pharmacy would you like this sent to?  [facility], [place]  Pt said he saw Dr Darlyn Read on 11/16 for shoulder pain but says he also talked to Dr Darlyn Read about his BP and even talked about his heart doctor. Says he should not have to come in again for this when he just talked to Dr Darlyn Read about his BP. No issues with medication per pt. Just needs refills.

## 2021-10-21 ENCOUNTER — Other Ambulatory Visit: Payer: Self-pay | Admitting: Family Medicine

## 2021-10-21 DIAGNOSIS — I2510 Atherosclerotic heart disease of native coronary artery without angina pectoris: Secondary | ICD-10-CM

## 2021-10-22 ENCOUNTER — Encounter (HOSPITAL_COMMUNITY): Payer: Self-pay

## 2021-10-22 ENCOUNTER — Emergency Department (HOSPITAL_COMMUNITY)
Admission: EM | Admit: 2021-10-22 | Discharge: 2021-10-22 | Disposition: A | Discharge status: Home / Self Care | Payer: Medicare HMO | Attending: Emergency Medicine | Admitting: Emergency Medicine

## 2021-10-22 ENCOUNTER — Emergency Department (HOSPITAL_COMMUNITY): Payer: Medicare HMO

## 2021-10-22 ENCOUNTER — Other Ambulatory Visit: Payer: Self-pay

## 2021-10-22 DIAGNOSIS — N200 Calculus of kidney: Secondary | ICD-10-CM | POA: Diagnosis not present

## 2021-10-22 DIAGNOSIS — Z79899 Other long term (current) drug therapy: Secondary | ICD-10-CM | POA: Insufficient documentation

## 2021-10-22 DIAGNOSIS — I10 Essential (primary) hypertension: Secondary | ICD-10-CM | POA: Insufficient documentation

## 2021-10-22 DIAGNOSIS — R109 Unspecified abdominal pain: Secondary | ICD-10-CM | POA: Diagnosis present

## 2021-10-22 DIAGNOSIS — Z7982 Long term (current) use of aspirin: Secondary | ICD-10-CM | POA: Diagnosis not present

## 2021-10-22 DIAGNOSIS — F17210 Nicotine dependence, cigarettes, uncomplicated: Secondary | ICD-10-CM | POA: Diagnosis not present

## 2021-10-22 LAB — URINALYSIS, ROUTINE W REFLEX MICROSCOPIC
Bilirubin Urine: NEGATIVE
Glucose, UA: NEGATIVE mg/dL
Ketones, ur: NEGATIVE mg/dL
Leukocytes,Ua: NEGATIVE
Nitrite: NEGATIVE
Protein, ur: NEGATIVE mg/dL
Specific Gravity, Urine: >1.03 — ABNORMAL HIGH (ref 1.005–1.030)
pH: 5.5 (ref 5.0–8.0)

## 2021-10-22 LAB — BASIC METABOLIC PANEL
Anion gap: 7 (ref 5–15)
BUN: 15 mg/dL (ref 8–23)
CO2: 22 mmol/L (ref 22–32)
Calcium: 8.7 mg/dL — ABNORMAL LOW (ref 8.9–10.3)
Chloride: 108 mmol/L (ref 98–111)
Creatinine, Ser: 1.1 mg/dL (ref 0.61–1.24)
GFR, Estimated: >60 mL/min (ref >60)
Glucose, Bld: 116 mg/dL — ABNORMAL HIGH (ref 70–99)
Potassium: 4 mmol/L (ref 3.5–5.1)
Sodium: 137 mmol/L (ref 135–145)

## 2021-10-22 LAB — CBC WITH DIFFERENTIAL/PLATELET
Abs Immature Granulocytes: 0.09 10*3/uL — ABNORMAL HIGH (ref 0.00–0.07)
Basophils Absolute: 0.1 10*3/uL (ref 0.0–0.1)
Basophils Relative: 0 %
Eosinophils Absolute: 0.3 10*3/uL (ref 0.0–0.5)
Eosinophils Relative: 2 %
HCT: 47.5 % (ref 39.0–52.0)
Hemoglobin: 15.9 g/dL (ref 13.0–17.0)
Immature Granulocytes: 1 %
Lymphocytes Relative: 8 %
Lymphs Abs: 1.3 10*3/uL (ref 0.7–4.0)
MCH: 32.9 pg (ref 26.0–34.0)
MCHC: 33.5 g/dL (ref 30.0–36.0)
MCV: 98.3 fL (ref 80.0–100.0)
Monocytes Absolute: 0.9 10*3/uL (ref 0.1–1.0)
Monocytes Relative: 5 %
Neutro Abs: 13.7 10*3/uL — ABNORMAL HIGH (ref 1.7–7.7)
Neutrophils Relative %: 84 %
Platelets: 225 10*3/uL (ref 150–400)
RBC: 4.83 MIL/uL (ref 4.22–5.81)
RDW: 14 % (ref 11.5–15.5)
WBC: 16.2 10*3/uL — ABNORMAL HIGH (ref 4.0–10.5)
nRBC: 0 % (ref 0.0–0.2)

## 2021-10-22 LAB — URINALYSIS, MICROSCOPIC (REFLEX)
Bacteria, UA: NONE SEEN
RBC / HPF: >50 RBC/hpf (ref 0–5)

## 2021-10-22 MED ORDER — OXYCODONE-ACETAMINOPHEN 5-325 MG PO TABS
1.0000 | ORAL_TABLET | Freq: Four times a day (QID) | ORAL | 0 refills | Status: DC | PRN
Start: 2021-10-22 — End: 2021-11-24

## 2021-10-22 MED ORDER — KETOROLAC TROMETHAMINE 30 MG/ML IJ SOLN
30.0000 mg | Freq: Once | INTRAMUSCULAR | Status: AC
  Administered 2021-10-22: 30 mg via INTRAVENOUS
  Filled 2021-10-22: qty 1

## 2021-10-22 MED ORDER — ONDANSETRON HCL 4 MG/2ML IJ SOLN
4.0000 mg | Freq: Once | INTRAMUSCULAR | Status: AC
  Administered 2021-10-22: 4 mg via INTRAVENOUS
  Filled 2021-10-22: qty 2

## 2021-10-22 MED ORDER — MORPHINE SULFATE (PF) 4 MG/ML IV SOLN
4.0000 mg | Freq: Once | INTRAVENOUS | Status: AC
  Administered 2021-10-22: 4 mg via INTRAVENOUS
  Filled 2021-10-22: qty 1

## 2021-10-22 NOTE — Discharge Instructions (Signed)
Begin taking Percocet as prescribed.  Follow-up with urology if your symptoms or not improving by Monday.  The contact information for [facility] has been provided in this discharge summary for you to call and make these arrangements.  Return to the emergency department for worsening pain, high fevers, or other new and concerning symptoms.

## 2021-10-22 NOTE — ED Provider Notes (Signed)
[facility] EMERGENCY DEPARTMENT Provider Note   CSN: [id number] Arrival date & time: 10/22/21  0309     History Chief Complaint  Patient presents with   Flank Pain    Johnny Foster is a 65 y.o. male.  Patient is a 65 year old male with past medical history of hypertension, anemia, hepatitis C, and coronary artery disease with stent.  Patient presenting today with complaints of left flank pain.  This woke him from sleep several hours ago.  The pain radiates to his left lower quadrant.  He has felt nauseated but has not vomited.  He also feels as though his abdomen is somewhat distended and as if he has to have a bowel movement.  He denies history of kidney stones.  The history is provided by the patient.  Flank Pain This is a new problem. The current episode started 1 to 2 hours ago. The problem occurs constantly. The problem has been rapidly worsening. Nothing aggravates the symptoms. Nothing relieves the symptoms. He has tried nothing for the symptoms.      Past Medical History:  Diagnosis Date   Hepatitis C    Treated   Hyperlipidemia    Hypertension    Iron deficiency anemia 10/02/2018   Myocardial infarction [facility])    May 2019.  LAD moderate 60 to 80% stenosis.  Circumflex OM occluded.  RCA 80% stenosis.  OM was thought to be a acute infarct vessel.  It was treated with a Synergy stent.  This is a 2.5 x 20 mm, RCA was treated with a 3.  3.0 by 16 mm Synergy.  Elective PCI in June of an LAD lesion with 3.0 x 38 mm Synergy.   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    Patient Active Problem List   Diagnosis Date Noted   LLQ pain 09/03/2021   Generalized abdominal pain 09/03/2021   Educated about COVID-19 virus infection 05/25/2020   Dupuytren's contracture of right hand 10/10/2019   Trigger middle finger of right hand 10/10/2019   Absolute anemia 10/11/2018   Iron deficiency anemia 10/02/2018   Encounter for long-term (current) use of  medications 09/19/2018   Tobacco abuse 09/19/2018   ASCVD (arteriosclerotic cardiovascular disease) 09/09/2018   Essential hypertension 09/09/2018   Mixed hyperlipidemia 09/09/2018    Past Surgical History:  Procedure Laterality Date   CARDIAC VALVE SURGERY     TONSILECTOMY, ADENOIDECTOMY, BILATERAL MYRINGOTOMY AND TUBES  1969       Family History  Problem Relation Age of Onset   Arthritis Mother    CAD Mother 62   Lung disease Father    Cancer Brother     Social History   Tobacco Use   Smoking status: Every Day    Packs/day: 0.25    Years: 40.00    Pack years: 10.00    Types: Cigarettes   Smokeless tobacco: Never  Vaping Use   Vaping Use: Never used  Substance Use Topics   Alcohol use: Yes    Comment: rarely   Drug use: Not Currently    Types: Other-see comments    Comment: history of Heroin and Cocaine use in his teens     Home Medications Prior to Admission medications   Medication Sig Start Date End Date Taking? Authorizing Provider  aspirin (ASPIRIN LOW DOSE) 81 MG EC tablet Take 1 tablet (81 mg total) by mouth daily. Swallow whole (NEEDS TO BE SEEN BEFORE NEXT REFILL) 09/03/21   Mechele Claude,  MD  atorvastatin (LIPITOR) 80 MG tablet TAKE 1 TABLET(80 MG) BY MOUTH DAILY (NEEDS TO BE SEEN BEFORE NEXT REFILL) 09/03/21   Mechele Claude, MD  cephALEXin (KEFLEX) 500 MG capsule Take 1 capsule (500 mg total) by mouth 2 (two) times daily. Patient not taking: No sig reported 05/25/21   Inis Sizer, PA-C  ciprofloxacin (CIPRO) 500 MG tablet Take 1 tablet (500 mg total) by mouth 2 (two) times daily. Patient not taking: No sig reported 08/03/21   Mechele Claude, MD  lisinopril (ZESTRIL) 5 MG tablet TAKE 1 TABLET(5 MG) BY MOUTH DAILY 10/20/21   Mechele Claude, MD  metoprolol tartrate (LOPRESSOR) 25 MG tablet TAKE 1 TABLET(25 MG) BY MOUTH TWICE DAILY (NEEDS TO BE SEEN BEFORE NEXT REFILL) 09/03/21   Mechele Claude, MD  nitroGLYCERIN (NITROSTAT) 0.4 MG SL tablet ONE  TABLET UNDER TONGUE AS NEEDED FOR CHEST PAIN 09/17/21   Mechele Claude, MD  predniSONE (DELTASONE) 10 MG tablet Take 5 daily for 2 days followed by 4,3,2 and 1 for 2 days each. 10/06/21   Mechele Claude, MD    Allergies    Patient has no known allergies.  Review of Systems   Review of Systems  Genitourinary:  Positive for flank pain.  All other systems reviewed and are negative.  Physical Exam Updated Vital Signs BP (!) 136/101   Pulse 73   Temp 98.5 F (36.9 C) (Oral)   Resp 19   Ht 5\' 10"  (1.778 m)   Wt 88 kg   SpO2 99%   BMI 27.84 kg/m   Physical Exam Vitals and nursing note reviewed.  Constitutional:      General: He is not in acute distress.    Appearance: He is well-developed. He is not diaphoretic.  HENT:     Head: Normocephalic and atraumatic.  Cardiovascular:     Rate and Rhythm: Normal rate and regular rhythm.     Heart sounds: No murmur heard.   No friction rub.  Pulmonary:     Effort: Pulmonary effort is normal. No respiratory distress.     Breath sounds: Normal breath sounds. No wheezing or rales.  Abdominal:     General: Bowel sounds are normal. There is no distension.     Palpations: Abdomen is soft.     Tenderness: There is no abdominal tenderness. There is left CVA tenderness. There is no right CVA tenderness.  Musculoskeletal:        General: Normal range of motion.     Cervical back: Normal range of motion and neck supple.  Skin:    General: Skin is warm and dry.  Neurological:     Mental Status: He is alert and oriented to person, place, and time.     Coordination: Coordination normal.    ED Results / Procedures / Treatments   Labs (all labs ordered are listed, but only abnormal results are displayed) Labs Reviewed  BASIC METABOLIC PANEL  CBC WITH DIFFERENTIAL/PLATELET    EKG EKG Interpretation  Date/Time:  Friday October 22 2021 03:30:14 EST Ventricular Rate:  73 PR Interval:  146 QRS Duration: 98 QT Interval:  379 QTC  Calculation: 418 R Axis:   70 Text Interpretation: Sinus rhythm Normal ECG Confirmed by [id number] (Geoffery Lyons) on 10/22/2021 4:05:25 AM  Radiology No results found.  Procedures Procedures   Medications Ordered in ED Medications  ondansetron (ZOFRAN) injection 4 mg (has no administration in time range)  morphine 4 MG/ML injection 4 mg (has no administration in time range)  ketorolac (TORADOL) 30 MG/ML injection 30 mg (has no administration in time range)    ED Course  I have reviewed the triage vital signs and the nursing notes.  Pertinent labs & imaging results that were available during my care of the patient were reviewed by me and considered in my medical decision making (see chart for details).    MDM Rules/Calculators/A&P  Patient presenting here with complaints of left flank pain that appears to be related to a 4 mm calculus at the left UVJ.  Patient has no fever and is nontoxic-appearing.  He does have a white count of 16,000 which I suspect is related to pain.  Urinalysis shows no evidence for infection.  He is feeling better after receiving Toradol and morphine.  At this point, I feel as though discharge is appropriate.  Patient will be given pain medication and advised to follow-up with urology if the stone has not passed come Monday.  Final Clinical Impression(s) / ED Diagnoses Final diagnoses:  None    Rx / DC Orders ED Discharge Orders     None        Geoffery Lyons, MD 10/22/21 [phone]

## 2021-10-22 NOTE — ED Triage Notes (Signed)
Pt reports left sided flank pain that awoke pt from sleep, pt also reports abd "is hard" Pt denies urinary sx

## 2021-11-17 ENCOUNTER — Other Ambulatory Visit: Payer: Self-pay

## 2021-11-17 ENCOUNTER — Ambulatory Visit
Admission: EM | Admit: 2021-11-17 | Discharge: 2021-11-17 | Disposition: A | Discharge status: Home / Self Care | Payer: Medicare HMO | Attending: Urgent Care | Admitting: Urgent Care

## 2021-11-17 DIAGNOSIS — J069 Acute upper respiratory infection, unspecified: Secondary | ICD-10-CM | POA: Diagnosis not present

## 2021-11-17 DIAGNOSIS — R0981 Nasal congestion: Secondary | ICD-10-CM | POA: Diagnosis not present

## 2021-11-17 DIAGNOSIS — R519 Headache, unspecified: Secondary | ICD-10-CM | POA: Diagnosis not present

## 2021-11-17 MED ORDER — IPRATROPIUM BROMIDE 0.03 % NA SOLN: 2.0000 | Freq: Two times a day (BID) | NASAL | 0 refills | Status: DC

## 2021-11-17 MED ORDER — PSEUDOEPHEDRINE HCL 60 MG PO TABS: 60.0000 mg | ORAL_TABLET | Freq: Three times a day (TID) | ORAL | 0 refills | Status: DC | PRN

## 2021-11-17 MED ORDER — CETIRIZINE HCL 10 MG PO TABS: 10.0000 mg | ORAL_TABLET | Freq: Every day | ORAL | 0 refills | Status: DC

## 2021-11-17 NOTE — Discharge Instructions (Addendum)
We will notify you of your test results as they arrive and may take between 48-72 hours.  I encourage you to sign up for MyChart if you have not already done so as this can be the easiest way for [place] to communicate results to you online or through a phone app.  Generally, we only contact you if it is a positive test result.  In the meantime, if you develop worsening symptoms including fever, chest pain, shortness of breath despite our current treatment plan then please report to the emergency room as this may be a sign of worsening status from possible viral infection.  Otherwise, we will manage this as a viral syndrome. For sore throat or cough try using a honey-based tea. Use 3 teaspoons of honey with juice squeezed from half lemon. Place shaved pieces of ginger into 1/2-1 cup of water and warm over stove top. Then mix the ingredients and repeat every 4 hours as needed. Please take Tylenol 500mg -650mg  every 6 hours for aches and pains, fevers. Hydrate very well with at least 2 liters of water. Eat light meals such as soups to replenish electrolytes and soft fruits, veggies. Start an antihistamine like Zyrtec for postnasal drainage, sinus congestion.  You can take this together with pseudoephedrine (Sudafed) at a dose of 60 mg 2-3 times a day as needed for the same kind of congestion.  Atrovent is a good nasal spray for this as well.

## 2021-11-17 NOTE — ED Triage Notes (Signed)
Pt reports headache and runny nose x 1 day. OTC meds gives no relief.

## 2021-11-17 NOTE — ED Provider Notes (Signed)
[facility]   MRN: [id number] DOB: 05/30/1956  Subjective:   Johnny Foster is a 65 y.o. male presenting for 2 day history of acute onset sinus congestion, sinus pressure.  He had a sinus headache as well initially but this is improved.  Has been using Vicks nasal spray and went through an entire bottle already.  No fever, body aches, ear pain, throat pain, cough, chest pain, shortness of breath, body aches.  No sick contacts to the best of his knowledge.  He is a smoker.  Would like to make sure he does not have COVID or the flu as he has family coming in.  No current facility-administered medications for this encounter.  Current Outpatient Medications:    ASPIRIN LOW DOSE 81 MG EC tablet, TAKE 1 TABLET(81 MG) BY MOUTH DAILY. SWALLOW WHOLE, Disp: 30 tablet, Rfl: 0   atorvastatin (LIPITOR) 80 MG tablet, TAKE 1 TABLET(80 MG) BY MOUTH DAILY (NEEDS TO BE SEEN BEFORE NEXT REFILL), Disp: 30 tablet, Rfl: 3   cephALEXin (KEFLEX) 500 MG capsule, Take 1 capsule (500 mg total) by mouth 2 (two) times daily. (Patient not taking: Reported on 09/27/2021), Disp: 20 capsule, Rfl: 0   ciprofloxacin (CIPRO) 500 MG tablet, Take 1 tablet (500 mg total) by mouth 2 (two) times daily. (Patient not taking: Reported on 09/27/2021), Disp: 14 tablet, Rfl: 0   lisinopril (ZESTRIL) 5 MG tablet, TAKE 1 TABLET(5 MG) BY MOUTH DAILY, Disp: 90 tablet, Rfl: 0   metoprolol tartrate (LOPRESSOR) 25 MG tablet, TAKE 1 TABLET(25 MG) BY MOUTH TWICE DAILY (NEEDS TO BE SEEN BEFORE NEXT REFILL), Disp: 60 tablet, Rfl: 0   nitroGLYCERIN (NITROSTAT) 0.4 MG SL tablet, ONE TABLET UNDER TONGUE AS NEEDED FOR CHEST PAIN, Disp: 25 tablet, Rfl: prn   oxyCODONE-acetaminophen (PERCOCET) 5-325 MG tablet, Take 1-2 tablets by mouth every 6 (six) hours as needed., Disp: 20 tablet, Rfl: 0   predniSONE (DELTASONE) 10 MG tablet, Take 5 daily for 2 days followed by 4,3,2 and 1 for 2 days each., Disp: 30 tablet, Rfl: 0   No Known  Allergies  Past Medical History:  Diagnosis Date   Hepatitis C    Treated   Hyperlipidemia    Hypertension    Iron deficiency anemia 10/02/2018   Myocardial infarction [facility])    May 2019.  LAD moderate 60 to 80% stenosis.  Circumflex OM occluded.  RCA 80% stenosis.  OM was thought to be a acute infarct vessel.  It was treated with a Synergy stent.  This is a 2.5 x 20 mm, RCA was treated with a 3.  3.0 by 16 mm Synergy.  Elective PCI in June of an LAD lesion with 3.0 x 38 mm Synergy.   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     Past Surgical History:  Procedure Laterality Date   CARDIAC VALVE SURGERY     TONSILECTOMY, ADENOIDECTOMY, BILATERAL MYRINGOTOMY AND TUBES  1969    Family History  Problem Relation Age of Onset   Arthritis Mother    CAD Mother 87   Lung disease Father    Cancer Brother     Social History   Tobacco Use   Smoking status: Every Day    Packs/day: 0.25    Years: 40.00    Pack years: 10.00    Types: Cigarettes   Smokeless tobacco: Never  Vaping Use   Vaping Use: Never used  Substance Use Topics   Alcohol use: Yes  Comment: rarely   Drug use: Not Currently    Types: Other-see comments    Comment: history of Heroin and Cocaine use in his teens     ROS   Objective:   Vitals: BP 136/87 (BP Location: Right Arm)    Pulse 70    Temp 98.4 F (36.9 C) (Oral)    Resp 18    SpO2 97%   Physical Exam Constitutional:      General: He is not in acute distress.    Appearance: Normal appearance. He is well-developed and normal weight. He is not ill-appearing, toxic-appearing or diaphoretic.  HENT:     Head: Normocephalic and atraumatic.     Right Ear: Tympanic membrane, ear canal and external ear normal. There is no impacted cerumen.     Left Ear: Tympanic membrane, ear canal and external ear normal. There is no impacted cerumen.     Nose: Congestion and rhinorrhea present.     Mouth/Throat:     Mouth: Mucous membranes are  moist.     Pharynx: No oropharyngeal exudate or posterior oropharyngeal erythema.  Eyes:     General: No scleral icterus.       Right eye: No discharge.        Left eye: No discharge.     Extraocular Movements: Extraocular movements intact.     Conjunctiva/sclera: Conjunctivae normal.     Pupils: Pupils are equal, round, and reactive to light.  Cardiovascular:     Rate and Rhythm: Normal rate.  Pulmonary:     Effort: Pulmonary effort is normal.  Musculoskeletal:     Cervical back: Normal range of motion and neck supple. No rigidity. No muscular tenderness.  Neurological:     General: No focal deficit present.     Mental Status: He is alert and oriented to person, place, and time.     Cranial Nerves: No cranial nerve deficit.     Motor: No weakness.     Coordination: Coordination normal.     Gait: Gait normal.  Psychiatric:        Mood and Affect: Mood normal.        Behavior: Behavior normal.        Thought Content: Thought content normal.    Assessment and Plan :   PDMP not reviewed this encounter.  1. Viral URI with cough   2. Sinus congestion   3. Sinus headache    COVID and flu test pending.  We will otherwise manage for viral upper respiratory infection.  Physical exam findings reassuring and vital signs stable for discharge. Advised supportive care, offered symptomatic relief. Counseled patient on potential for adverse effects with medications prescribed/recommended today, ER and return-to-clinic precautions discussed, patient verbalized understanding.      Wallis Bamberg, [place] 11/17/21 [phone]

## 2021-11-18 LAB — COVID-19, FLU A+B NAA
Influenza A, NAA: NOT DETECTED
Influenza B, NAA: NOT DETECTED
SARS-CoV-2, NAA: NOT DETECTED

## 2021-11-23 ENCOUNTER — Other Ambulatory Visit: Payer: Self-pay

## 2021-11-23 ENCOUNTER — Ambulatory Visit: Payer: Medicare HMO | Attending: Family Medicine

## 2021-11-23 DIAGNOSIS — M6281 Muscle weakness (generalized): Secondary | ICD-10-CM | POA: Diagnosis present

## 2021-11-23 DIAGNOSIS — G8929 Other chronic pain: Secondary | ICD-10-CM | POA: Insufficient documentation

## 2021-11-23 DIAGNOSIS — M25511 Pain in right shoulder: Secondary | ICD-10-CM | POA: Insufficient documentation

## 2021-11-23 NOTE — Progress Notes (Signed)
Cardiology Office Note   Date:  11/24/2021   ID:  Johnny Foster, DOB 1956/08/04, MRN [id number]  PCP:  Mechele Claude, MD  Cardiologist:   Rollene Rotunda, MD     Chief Complaint  Patient presents with   Coronary Artery Disease      History of Present Illness: Johnny Foster is a 66 y.o. male who presents for follow up of CAD  He had a cardiac cath in June 2019 in [place] after presenting with a acute inferolateral MI.  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  EF was normal.     Since I last saw him he has done very well.  He stays very active and even worked to put it back on.  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 The patient denies any new symptoms such as chest discomfort, neck or arm discomfort. There has been no new shortness of breath, PND or orthopnea. There have been no reported palpitations, presyncope or syncope.   He did have a kidney stone in early December and went to the emergency room and I did review these records.  An EKG was done and was unremarkable.   Past Medical History:  Diagnosis Date   Hepatitis C    Treated   Hyperlipidemia    Hypertension    Iron deficiency anemia 10/02/2018   Myocardial infarction [facility])    May 2019.  LAD moderate 60 to 80% stenosis.  Circumflex OM occluded.  RCA 80% stenosis.  OM was thought to be a acute infarct vessel.  It was treated with a Synergy stent.  This is a 2.5 x 20 mm, RCA was treated with a 3.  3.0 by 16 mm Synergy.  Elective PCI in June of an LAD lesion with 3.0 x 38 mm Synergy.   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 SWALLOW WHOLE 30 tablet 0   atorvastatin (LIPITOR) 80 MG tablet TAKE 1 TABLET(80 MG) BY MOUTH DAILY (NEEDS TO BE SEEN BEFORE NEXT REFILL) 30 tablet 3   lisinopril (ZESTRIL) 5 MG tablet TAKE 1 TABLET(5 MG) BY MOUTH DAILY 90 tablet 0   metoprolol tartrate (LOPRESSOR) 25 MG tablet TAKE 1 TABLET(25 MG) BY MOUTH TWICE DAILY (NEEDS TO BE SEEN BEFORE NEXT REFILL) 60 tablet 0   nitroGLYCERIN (NITROSTAT) 0.4 MG SL tablet ONE TABLET UNDER TONGUE AS NEEDED FOR CHEST PAIN 25 tablet prn   No current facility-administered medications for this visit.    Allergies:   Patient has no known allergies.    ROS:  Please see the history of present illness.   Otherwise, review of systems are positive for none.   All other systems are reviewed and negative.    PHYSICAL EXAM: VS:  BP 130/70    Pulse 79    Ht 5\' 10"  (1.778 m)    Wt 197 lb (89.4 kg)    SpO2 94%    BMI 28.27 kg/m  , BMI Body mass index is 28.27 kg/m. GENERAL:  Well appearing NECK:  No jugular  venous distention, waveform within normal limits, carotid upstroke brisk and symmetric, no bruits, no thyromegaly LUNGS:  Clear to auscultation bilaterally CHEST:  Unremarkable HEART:  PMI not displaced or sustained,S1 and S2 within normal limits, no S3, no S4, no clicks, no rubs, no murmurs ABD:  Flat, positive bowel sounds normal in frequency in pitch, no bruits, no rebound, no guarding, no midline pulsatile mass, no hepatomegaly, no splenomegaly EXT:  2 plus pulses throughout, no edema, no cyanosis no clubbing   EKG:  EKG is not ordered today. The ekg ordered 10/22/21 demonstrates sinus rhythm, rate 73, axis within normal limits, intervals within normal limits, early repolarization changes unchanged from previous.   Recent Labs: 08/03/2021: ALT 30 10/22/2021: BUN 15; Creatinine, Ser 1.10; Hemoglobin 15.9; Platelets 225; Potassium 4.0; Sodium 137    Lipid Panel    Component Value Date/Time   CHOL 125 09/22/2020 1204   TRIG 103 09/22/2020 1204    HDL 55 09/22/2020 1204   CHOLHDL 2.3 09/22/2020 1204   LDLCALC 51 09/22/2020 1204      Wt Readings from Last 3 Encounters:  11/24/21 197 lb (89.4 kg)  10/22/21 194 lb 0.1 oz (88 kg)  10/06/21 194 lb 3.2 oz (88.1 kg)      Other studies Reviewed: Additional studies/ records that were reviewed today include: ED records Review of the above records demonstrates:  Please see elsewhere in the note.     ASSESSMENT AND PLAN:  CAD:  The patient has no new sypmtoms.  No further cardiovascular testing is indicated.  We will continue with aggressive risk reduction and meds as listed.  TOBACCO ABUSE :     He is unfortunately still smoking half a pack per day and we talked about the need to have complete cessation.   DYSLIPIDEMIA:    LDL was checked since November 2021 and I will have him come back for fasting lipid profile.    Current medicines are reviewed at length with the patient today.  The patient does not have concerns regarding medicines.  The following changes have been made:  None  Labs/ tests ordered today include:  None  Orders Placed This Encounter  Procedures   Lipid panel     Disposition:   FU with in 18  months.     Signed, Rollene Rotunda, MD  11/24/2021 3:10 PM    [facility]

## 2021-11-23 NOTE — Therapy (Signed)
[facility] [place], [place], [address] Phone: [phone]   Fax:  [phone]  Physical Therapy Evaluation  Patient Details  Name: Johnny Foster MRN: [id number] Date of Birth: Jun 07, 1956 Referring Provider (PT): Claretta Fraise, MD   Encounter Date: 11/23/2021   PT End of Session - 11/23/21 0820     Visit Number 1    Number of Visits 10    Date for PT Re-Evaluation 02/11/22    PT Start Time 0821    PT Stop Time 0902    PT Time Calculation (min) 41 min    Activity Tolerance Patient tolerated treatment well    Behavior During Therapy [facility] for tasks assessed/performed             Past Medical History:  Diagnosis Date   Hepatitis C    Treated   Hyperlipidemia    Hypertension    Iron deficiency anemia 10/02/2018   Myocardial infarction [facility])    May 2019.  LAD moderate 60 to 80% stenosis.  Circumflex OM occluded.  RCA 80% stenosis.  OM was thought to be a acute infarct vessel.  It was treated with a Synergy stent.  This is a 2.5 x 20 mm, RCA was treated with a 3.  3.0 by 16 mm Synergy.  Elective PCI in June of an LAD lesion with 3.0 x 38 mm Synergy.   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    Subjective Assessment - 11/23/21 0820     Subjective Patient reports that this began around 2018. He had tried PT, injections, and other methods to reduce his pain. He has begun to experience pain radiating down to the proximal 1/3 of his humerous. He feels that most of his pain occus while he tries to relax, sleep, and driving for long periods. He still notes that he has good mobility and use of his right arm. He notes that his shoulder wakes him up most night nights. He thinks it wakes him up about every 4  hours. He notes that he has not felt comfortable going to the gym since [place] began.    Pertinent History HTN    Limitations Lifting    Patient Stated Goals reducing his pain, sleep better,    Currently in Pain? Yes    Pain Score 6     Pain Location Shoulder    Pain Orientation Right    Pain Descriptors / Indicators Stabbing    Pain Type Chronic pain    Pain Radiating Towards proximal 1/3 of the right humerous.    Pain Onset More than a month ago    Pain Frequency Intermittent    Aggravating Factors  sleeping, relaxing, and driving    Pain Relieving Factors extending his arm out to his side, medication, heat, biofreeze    Effect of Pain on Daily Activities limits his ability to sleep                [facility] PT Assessment - 11/23/21 0001       Assessment   Medical Diagnosis Right shoulder pain    Referring Provider (PT) Claretta Fraise, MD    Onset Date/Surgical Date --   around 2018   Hand Dominance Right  Next MD Visit none scheduled    Prior Therapy Yes      Precautions   Precautions None      Restrictions   Weight Bearing Restrictions No      Balance Screen   Has the patient fallen in the past 6 months No    Has the patient had a decrease in activity level because of a fear of falling?  No    Is the patient reluctant to leave their home because of a fear of falling?  No      Home Ecologist residence      Prior Function   Level of Independence Independent    Vocation Retired    Leisure ride his motorcycle      Cognition   Overall Cognitive Status Within Functional Limits for tasks assessed    Attention Focused    Focused Attention Appears intact    Memory Appears intact    Awareness Appears intact    Problem Solving Appears intact      Sensation   Additional Comments Patient reports no numbness or tingling      ROM / Strength   AROM / PROM / Strength AROM;Strength      AROM   Overall AROM  Within functional limits for  tasks performed    Overall AROM Comments no significant ROM differences between the left and right UE    AROM Assessment Site Shoulder      Strength   Strength Assessment Site Shoulder    Right/Left Shoulder Right;Left    Right Shoulder Flexion 4-/5    Right Shoulder ABduction 4/5    Right Shoulder Internal Rotation 4/5    Right Shoulder External Rotation 4-/5   mild tingling in right shoulder   Left Shoulder Flexion 4/5    Left Shoulder ABduction 4/5    Left Shoulder Internal Rotation 5/5    Left Shoulder External Rotation 4+/5      Palpation   Palpation comment tenderness to palpation to right UT and levator scap   recreates familiar discomfort; Cervical and GH joint mobility: WFL                       Objective measurements completed on examination: See above findings.       [facility] Adult PT Treatment/Exercise - 11/23/21 0001       Exercises   Exercises Shoulder      Shoulder Exercises: Seated   Retraction Both;20 reps      Shoulder Exercises: Stretch   Other Shoulder Stretches Right UT stretch   holding table; 10-15 second; 4 reps                         PT Long Term Goals - 11/23/21 1315       PT LONG TERM GOAL #1   Title Patient will be independent with his HEP.    Time 5    Period Weeks    Status New    Target Date 12/28/21      PT LONG TERM GOAL #2   Title Patient will report being able to sleep at least 6 hours without being awakened by his familiar right shoulder pain.    Baseline 4 hours on average    Time 5    Period Weeks    Status New    Target Date 12/28/21      PT LONG TERM GOAL #3  Title Patient will report his familiar shoulder pain at 4/10 or less with his daily activities for improved function with his daily activiites.    Baseline 6/10    Time 5    Period Weeks    Status New    Target Date 12/28/21                    Plan - 11/23/21 0820     Clinical Impression Statement Patient is a 66  year old male presenting to physical therapy with chronic right shoulder pain with no known cause. He presented today with moderate right shoulder pain severity and irritability. Palpation to the right upper trapezius and levator scapulae were able to reproduce his familiar shoulder pain and discomfort. He may benefit from dry needling to this area to reduce his familiar symptoms. Recommend that he continue with physical therapy to address his remaining impairments to return to his prior level of function.    Personal Factors and Comorbidities Comorbidity 1;Time since onset of injury/illness/exacerbation    Comorbidities HTN    Examination-Activity Limitations Sleep    Examination-Participation Restrictions Other    Stability/Clinical Decision Making Evolving/Moderate complexity    Clinical Decision Making Moderate    Rehab Potential Fair    PT Frequency 2x / week    PT Duration Other (comment)   5 weeks   PT Treatment/Interventions Cryotherapy;Electrical Stimulation;Iontophoresis 4mg /ml Dexamethasone;Moist Heat;Ultrasound;Neuromuscular re-education;Therapeutic exercise;Therapeutic activities;Dry needling;Energy conservation;Manual techniques;Patient/family education;Vasopneumatic Device;Taping    PT Next Visit Plan UBE, STM/dry needling to UT and levator scap, shoulder strengthening, modalities as needed    PT Home Exercise Plan upper trapezius stretch, scapular retraction    Consulted and Agree with Plan of Care Patient             Patient will benefit from skilled therapeutic intervention in order to improve the following deficits and impairments:  Decreased activity tolerance, Pain, Decreased strength  Visit Diagnosis: Chronic right shoulder pain  Muscle weakness (generalized)     Problem List Patient Active Problem List   Diagnosis Date Noted   LLQ pain 09/03/2021   Generalized abdominal pain 09/03/2021   Educated about COVID-19 virus infection 05/25/2020   Dupuytren's  contracture of right hand 10/10/2019   Trigger middle finger of right hand 10/10/2019   Absolute anemia 10/11/2018   Iron deficiency anemia 10/02/2018   Encounter for long-term (current) use of medications 09/19/2018   Tobacco abuse 09/19/2018   ASCVD (arteriosclerotic cardiovascular disease) 09/09/2018   Essential hypertension 09/09/2018   Mixed hyperlipidemia 09/09/2018    Darlin Coco, PT 11/23/2021, 1:20 PM  [facility] [address] [place], [place], [address] Phone: [phone]   Fax:  [phone]  Name: Johnny Foster MRN: [id number] Date of Birth: 05-Apr-1956

## 2021-11-24 ENCOUNTER — Encounter: Payer: Self-pay | Admitting: Cardiology

## 2021-11-24 ENCOUNTER — Ambulatory Visit (INDEPENDENT_AMBULATORY_CARE_PROVIDER_SITE_OTHER): Payer: Medicare HMO | Admitting: Cardiology

## 2021-11-24 VITALS — BP 130/70 | HR 79 | Ht 70.0 in | Wt 197.0 lb

## 2021-11-24 DIAGNOSIS — Z720 Tobacco use: Secondary | ICD-10-CM

## 2021-11-24 DIAGNOSIS — E785 Hyperlipidemia, unspecified: Secondary | ICD-10-CM

## 2021-11-24 DIAGNOSIS — I2510 Atherosclerotic heart disease of native coronary artery without angina pectoris: Secondary | ICD-10-CM

## 2021-11-24 NOTE — Patient Instructions (Signed)
Medication Instructions:  The current medical regimen is effective;  continue present plan and medications.  *If you need a refill on your cardiac medications before your next appointment, please call your pharmacy*  Lab Work: Please have Lipid profile at [facility].  If you have labs (blood work) drawn today and your tests are completely normal, you will receive your results only by: MyChart Message (if you have MyChart) OR A paper copy in the mail If you have any lab test that is abnormal or we need to change your treatment, we will call you to review the results.  Follow-Up: At [facility], you and your health needs are our priority.  As part of our continuing mission to provide you with exceptional heart care, we have created designated Provider Care Teams.  These Care Teams include your primary Cardiologist (physician) and Advanced Practice Providers (APPs -  Physician Assistants and Nurse Practitioners) who all work together to provide you with the care you need, when you need it.  We recommend signing up for the patient portal called "MyChart".  Sign up information is provided on this After Visit Summary.  MyChart is used to connect with patients for Virtual Visits (Telemedicine).  Patients are able to view lab/test results, encounter notes, upcoming appointments, etc.  Non-urgent messages can be sent to your provider as well.   To learn more about what you can do with MyChart, go to [email or web address].    Your next appointment:   18 month(s)  The format for your next appointment:   In Person  Provider:   Rollene Rotunda, MD    Thank you for choosing [facility]!!

## 2021-11-25 ENCOUNTER — Other Ambulatory Visit: Payer: Self-pay

## 2021-11-25 ENCOUNTER — Ambulatory Visit: Payer: Medicare HMO

## 2021-11-25 DIAGNOSIS — M6281 Muscle weakness (generalized): Secondary | ICD-10-CM

## 2021-11-25 DIAGNOSIS — M25511 Pain in right shoulder: Secondary | ICD-10-CM | POA: Diagnosis not present

## 2021-11-25 DIAGNOSIS — G8929 Other chronic pain: Secondary | ICD-10-CM

## 2021-11-25 NOTE — Therapy (Addendum)
[facility] [address] [place], [place], [address] Phone: [phone]   Fax:  [phone]  Physical Therapy Treatment  Patient Details  Name: Johnny Foster MRN: [id number] Date of Birth: [birth year] Referring Provider (PT): Mechele Claude, MD   Encounter Date: 11/25/2021   PT End of Session - 11/25/21 0935     Visit Number 2    Number of Visits 10    Date for PT Re-Evaluation 02/11/22    PT Start Time 0945    PT Stop Time 1026    PT Time Calculation (min) 41 min    Activity Tolerance Patient tolerated treatment well    Behavior During Therapy [facility] for tasks assessed/performed             Past Medical History:  Diagnosis Date   Hepatitis C    Treated   Hyperlipidemia    Hypertension    Iron deficiency anemia 10/02/2018   Myocardial infarction [facility])    May 2019.  LAD moderate 60 to 80% stenosis.  Circumflex OM occluded.  RCA 80% stenosis.  OM was thought to be a acute infarct vessel.  It was treated with a Synergy stent.  This is a 2.5 x 20 mm, RCA was treated with a 3.  3.0 by 16 mm Synergy.  Elective PCI in June of an LAD lesion with 3.0 x 38 mm Synergy.   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   Subjective Assessment - 11/25/21 0935     Subjective Patient reports that he is feeling much better today. He reported that he was able to sleep good last night.    Pertinent History HTN    Limitations Lifting    Patient Stated Goals reducing his pain, sleep better,    Currently in Pain? No/denies    Pain Onset More than a month ago                               [facility] Adult PT Treatment/Exercise - 11/25/21 0001       Shoulder Exercises: Seated   Retraction Both;15 reps   2 sets      Shoulder Exercises: Standing   External Rotation Strengthening;Right;Theraband   2 minutes   Theraband Level (Shoulder External Rotation) Level 3 (Green)    Internal Rotation Strengthening;Right;Theraband   2 minutes   Theraband Level (Shoulder Internal Rotation) Level 3 (Green)    Flexion Both;20 reps;Theraband    Theraband Level (Shoulder Flexion) Level 3 (Green)   maintaining tension using theraband loop   Extension Both   2 minutes   Extension Weight (lbs) Blue XTS    Extension Limitations required cuing for a 3 count with concentric and eccentric motions for pacing    Other Standing Exercises Therabar bending   up and down (30 reps each)     Shoulder Exercises: ROM/Strengthening   UBE (Upper Arm Bike) 5/5 minutes (forward/backward)      Shoulder Exercises: Stretch   Corner Stretch 4 reps;30 seconds    Wall Stretch - Flexion 4 reps;30 seconds    Other Shoulder Stretches Right UT stretch   10 second hold; 10 reps  PT Long Term Goals - 11/23/21 1315       PT LONG TERM GOAL #1   Title Patient will be independent with his HEP.    Time 5    Period Weeks    Status New    Target Date 12/28/21      PT LONG TERM GOAL #2   Title Patient will report being able to sleep at least 6 hours without being awakened by his familiar right shoulder pain.    Baseline 4 hours on average    Time 5    Period Weeks    Status New    Target Date 12/28/21      PT LONG TERM GOAL #3   Title Patient will report his familiar shoulder pain at 4/10 or less with his daily activities for improved function with his daily activiites.    Baseline 6/10    Time 5    Period Weeks    Status New    Target Date 12/28/21                   Plan - 11/25/21 0935     Clinical Impression Statement Patient was introduced to multiple new interventions for improved shoulder strength and reduced symptoms. He required moderate cuing with today's new interventions for  proper biomechanics and pacing to facilitate proper rotator cuff and periscapular engagment. He presented with poor recall of his HEP as he required cuing with proper positioning and dosage with these interventions. He reported intermittent right shoulder tightness throughout treatment, but this was able to be significantly reduced with stretching to the right shoulder and upper trapezius.  He reported that his shoulder felt good upon the conclusion of treatment. He continues to require skilled physical therapy to address his remaining impairments to return to his prior level of function.    Personal Factors and Comorbidities Comorbidity 1;Time since onset of injury/illness/exacerbation    Comorbidities HTN    Examination-Activity Limitations Sleep    Examination-Participation Restrictions Other    Stability/Clinical Decision Making Evolving/Moderate complexity    Rehab Potential Fair    PT Frequency 2x / week    PT Duration Other (comment)   5 weeks   PT Treatment/Interventions Cryotherapy;Electrical Stimulation;Iontophoresis 4mg /ml Dexamethasone;Moist Heat;Ultrasound;Neuromuscular re-education;Therapeutic exercise;Therapeutic activities;Dry needling;Energy conservation;Manual techniques;Patient/family education;Vasopneumatic Device;Taping    PT Next Visit Plan UBE, STM/dry needling to UT and levator scap, shoulder strengthening, modalities as needed    PT Home Exercise Plan upper trapezius stretch, scapular retraction    Consulted and Agree with Plan of Care Patient             Patient will benefit from skilled therapeutic intervention in order to improve the following deficits and impairments:  Decreased activity tolerance, Pain, Decreased strength  Visit Diagnosis: Chronic right shoulder pain  Muscle weakness (generalized)     Problem List Patient Active Problem List   Diagnosis Date Noted   LLQ pain 09/03/2021   Generalized abdominal pain 09/03/2021   Educated about COVID-19  virus infection 05/25/2020   Dupuytren's contracture of right hand 10/10/2019   Trigger middle finger of right hand 10/10/2019   Absolute anemia 10/11/2018   Iron deficiency anemia 10/02/2018   Encounter for long-term (current) use of medications 09/19/2018   Tobacco abuse 09/19/2018   ASCVD (arteriosclerotic cardiovascular disease) 09/09/2018   Essential hypertension 09/09/2018   Mixed hyperlipidemia 09/09/2018    Granville Lewis, PT 11/25/2021, 10:30 AM  [facility] [address] [place],  [place], [address] Phone: [phone]   Fax:  [phone]  Name: Johnny Foster MRN: [id number] Date of Birth: 06-Aug-1956  PHYSICAL THERAPY DISCHARGE SUMMARY  Visits from Start of Care: 2  Current functional level related to goals / functional outcomes: Patient is being discharged at this time as he did not return to therapy after his visit on 11/25/21.   Remaining deficits: See evaluation    Education / Equipment: HEP    Patient agrees to discharge. Patient goals were not met. Patient is being discharged due to not returning since the last visit.

## 2021-12-02 ENCOUNTER — Ambulatory Visit: Payer: Medicare HMO | Admitting: *Deleted

## 2021-12-07 ENCOUNTER — Other Ambulatory Visit: Payer: Self-pay | Admitting: Family Medicine

## 2021-12-07 DIAGNOSIS — I2510 Atherosclerotic heart disease of native coronary artery without angina pectoris: Secondary | ICD-10-CM

## 2021-12-08 ENCOUNTER — Other Ambulatory Visit: Payer: Medicare HMO

## 2021-12-08 DIAGNOSIS — E785 Hyperlipidemia, unspecified: Secondary | ICD-10-CM

## 2021-12-09 LAB — LIPID PANEL
Chol/HDL Ratio: 2.3 ratio (ref 0.0–5.0)
Cholesterol, Total: 129 mg/dL (ref 100–199)
HDL: 56 mg/dL (ref >39)
LDL Chol Calc (NIH): 56 mg/dL (ref 0–99)
Triglycerides: 91 mg/dL (ref 0–149)
VLDL Cholesterol Cal: 17 mg/dL (ref 5–40)

## 2021-12-13 ENCOUNTER — Encounter: Payer: Self-pay | Admitting: *Deleted

## 2022-01-06 ENCOUNTER — Other Ambulatory Visit: Payer: Self-pay | Admitting: Family Medicine

## 2022-01-06 DIAGNOSIS — I2510 Atherosclerotic heart disease of native coronary artery without angina pectoris: Secondary | ICD-10-CM

## 2022-01-19 ENCOUNTER — Other Ambulatory Visit: Payer: Self-pay | Admitting: Family Medicine

## 2022-01-22 ENCOUNTER — Other Ambulatory Visit: Payer: Self-pay | Admitting: Family Medicine

## 2022-01-24 ENCOUNTER — Telehealth: Payer: Self-pay | Admitting: Family Medicine

## 2022-01-24 ENCOUNTER — Other Ambulatory Visit: Payer: Self-pay

## 2022-01-24 ENCOUNTER — Other Ambulatory Visit: Payer: Self-pay | Admitting: Family Medicine

## 2022-01-24 DIAGNOSIS — I2510 Atherosclerotic heart disease of native coronary artery without angina pectoris: Secondary | ICD-10-CM

## 2022-01-24 MED ORDER — METOPROLOL TARTRATE 25 MG PO TABS: ORAL_TABLET | ORAL | 0 refills | Status: DC

## 2022-01-24 MED ORDER — ATORVASTATIN CALCIUM 80 MG PO TABS: 80.0000 mg | ORAL_TABLET | Freq: Every day | ORAL | 0 refills | Status: DC

## 2022-01-24 MED ORDER — LISINOPRIL 5 MG PO TABS: ORAL_TABLET | ORAL | 0 refills | Status: DC

## 2022-01-24 MED ORDER — ASPIRIN 81 MG PO TBEC: 81.0000 mg | DELAYED_RELEASE_TABLET | Freq: Every day | ORAL | 0 refills | Status: DC

## 2022-01-24 NOTE — Telephone Encounter (Signed)
Patient called in for medication refills. ? ?States Dr. Darlyn Read said he only needed to see him every 6 months. ? ?Why are we not refilling his medications? ? ?Needs: ?Atorvastatin 80mg  (only has 1 pill left) ?Lisinopril 5 mg ?Metoprolol tartrate 25mg  ?Aspirin 81 mg ? ?Please call him and let him know what the problem is. ?

## 2022-01-24 NOTE — Telephone Encounter (Signed)
30 day supply of medications sent to [facility] in [place]. Pt strongly advised to keep appt scheduled for 3/15 before more refills can be given. ?

## 2022-02-02 ENCOUNTER — Ambulatory Visit: Payer: Medicare HMO | Admitting: Family Medicine

## 2022-02-02 ENCOUNTER — Encounter: Payer: Self-pay | Admitting: Family Medicine

## 2022-02-02 VITALS — BP 129/66 | HR 60 | Temp 96.5°F | Ht 70.0 in | Wt 201.0 lb

## 2022-02-02 DIAGNOSIS — E782 Mixed hyperlipidemia: Secondary | ICD-10-CM

## 2022-02-02 DIAGNOSIS — Z125 Encounter for screening for malignant neoplasm of prostate: Secondary | ICD-10-CM | POA: Diagnosis not present

## 2022-02-02 DIAGNOSIS — I10 Essential (primary) hypertension: Secondary | ICD-10-CM | POA: Diagnosis not present

## 2022-02-02 DIAGNOSIS — M792 Neuralgia and neuritis, unspecified: Secondary | ICD-10-CM

## 2022-02-02 DIAGNOSIS — I2510 Atherosclerotic heart disease of native coronary artery without angina pectoris: Secondary | ICD-10-CM

## 2022-02-02 MED ORDER — ATORVASTATIN CALCIUM 80 MG PO TABS: 80.0000 mg | ORAL_TABLET | Freq: Every day | ORAL | 3 refills | Status: DC

## 2022-02-02 MED ORDER — ASPIRIN 81 MG PO TBEC: 81.0000 mg | DELAYED_RELEASE_TABLET | Freq: Every day | ORAL | 3 refills | Status: DC

## 2022-02-02 MED ORDER — METOPROLOL TARTRATE 25 MG PO TABS: ORAL_TABLET | ORAL | 3 refills | Status: DC

## 2022-02-02 MED ORDER — LISINOPRIL 5 MG PO TABS: ORAL_TABLET | ORAL | 3 refills | Status: DC

## 2022-02-02 NOTE — Progress Notes (Signed)
? ?Subjective:  ?Patient ID: Johnny Foster, male    DOB: 10-05-1956  Age: 66 y.o. MRN: [id number] ? ?CC: Medical Management of Chronic Issues ? ? ?HPI ?Johnny Foster presents for check up.  ? ?Depression screen Johnny Foster 2/9 02/02/2022 10/06/2021 09/27/2021  ?Decreased Interest 0 0 0  ?Down, Depressed, Hopeless 0 0 0  ?PHQ - 2 Score 0 0 0  ?Altered sleeping - - -  ?Tired, decreased energy - - -  ?Change in appetite - - -  ?Feeling bad or failure about yourself  - - -  ?Trouble concentrating - - -  ?Moving slowly or fidgety/restless - - -  ?Suicidal thoughts - - -  ?PHQ-9 Score - - -  ? ? ?History ?Johnny Foster has a past medical history of Hepatitis C, Hyperlipidemia, Hypertension, Iron deficiency anemia (10/02/2018), Myocardial infarction [facility]), and Substance abuse ([place]).  ? ?Johnny Foster has a past surgical history that includes Tonsilectomy, adenoidectomy, bilateral myringotomy and tubes (1969) and Cardiac valuve replacement.  ? ?His family history includes Arthritis in his mother; CAD (age of onset: 55) in his mother; Cancer in his brother; Lung disease in his father.Johnny Foster reports that Johnny Foster has been smoking cigarettes. Johnny Foster has a 10.00 pack-year smoking history. Johnny Foster has never used smokeless tobacco. Johnny Foster reports current alcohol use. Johnny Foster reports that Johnny Foster does not currently use drugs after having used the following drugs: Other-see comments. ? ? ? ?ROS ?Review of Systems  ?Constitutional:  Negative for fever.  ?Respiratory:  Negative for shortness of breath.   ?Cardiovascular:  Negative for chest pain.  ?Musculoskeletal:  Negative for arthralgias.  ?Skin:  Negative for rash.  ? ?Objective:  ?BP 129/66   Pulse 60   Temp (!) 96.5 ?F (35.8 ?C)   Ht 5' 10"  (1.778 m)   Wt 201 lb (91.2 kg)   SpO2 98%   BMI 28.84 kg/m?  ? ?BP Readings from Last 3 Encounters:  ?02/02/22 129/66  ?11/24/21 130/70  ?11/17/21 136/87  ? ? ?Wt Readings from Last 3 Encounters:  ?02/02/22 201 lb (91.2 kg)  ?11/24/21 197 lb (89.4 kg)  ?10/22/21 194 lb 0.1 oz (88 kg)   ? ? ? ?Physical Exam ?Vitals reviewed.  ?Constitutional:   ?   Appearance: Johnny Foster is well-developed.  ?HENT:  ?   Head: Normocephalic and atraumatic.  ?   Right Ear: External ear normal.  ?   Left Ear: External ear normal.  ?   Mouth/Throat:  ?   Pharynx: No oropharyngeal exudate or posterior oropharyngeal erythema.  ?Eyes:  ?   Pupils: Pupils are equal, round, and reactive to light.  ?Cardiovascular:  ?   Rate and Rhythm: Normal rate and regular rhythm.  ?   Heart sounds: No murmur heard. ?Pulmonary:  ?   Effort: No respiratory distress.  ?   Breath sounds: Normal breath sounds.  ?Abdominal:  ?   General: There is no distension.  ?   Tenderness: There is no abdominal tenderness.  ?Musculoskeletal:  ?   Cervical back: Normal range of motion and neck supple.  ?Neurological:  ?   Mental Status: Johnny Foster is alert and oriented to person, place, and time.  ? ? ? ? ?Assessment & Plan:  ? ?Johnny Foster was seen today for medical management of chronic issues. ? ?Diagnoses and all orders for this visit: ? ?Prostate cancer screening ?-     PSA, total and free ? ?Mixed hyperlipidemia ? ?Essential hypertension ?-     CBC with Differential/Platelet ?-  CMP14+EGFR ? ?ASCVD (arteriosclerotic cardiovascular disease) ?-     aspirin (ASPIRIN LOW DOSE) 81 MG EC tablet; Take 1 tablet (81 mg total) by mouth daily. ? ?Neuralgia ?-     Ambulatory referral to Neurology ? ?Other orders ?-     atorvastatin (LIPITOR) 80 MG tablet; Take 1 tablet (80 mg total) by mouth daily. ?-     lisinopril (ZESTRIL) 5 MG tablet; TAKE 1 TABLET(5 MG) BY MOUTH DAILY ?-     metoprolol tartrate (LOPRESSOR) 25 MG tablet; TAKE 1 TABLET(25 MG) BY MOUTH TWICE DAILY ? ? ? ? ? ? ?I have changed Johnny Foster's aspirin, atorvastatin, lisinopril, and metoprolol tartrate. I am also having him maintain his nitroGLYCERIN. ? ?Allergies as of 02/02/2022   ?No Known Allergies ?  ? ?  ?Medication List  ?  ? ?  ? Accurate as of February 02, 2022 10:12 AM. If you have any questions, ask your  nurse or doctor.  ?  ?  ? ?  ? ?aspirin 81 MG EC tablet ?Commonly known as: Aspirin Low Dose ?Take 1 tablet (81 mg total) by mouth daily. ?What changed: additional instructions ?Changed by: Claretta Fraise, MD ?  ?atorvastatin 80 MG tablet ?Commonly known as: LIPITOR ?Take 1 tablet (80 mg total) by mouth daily. ?What changed: additional instructions ?Changed by: Claretta Fraise, MD ?  ?lisinopril 5 MG tablet ?Commonly known as: ZESTRIL ?TAKE 1 TABLET(5 MG) BY MOUTH DAILY ?What changed: additional instructions ?Changed by: Claretta Fraise, MD ?  ?metoprolol tartrate 25 MG tablet ?Commonly known as: LOPRESSOR ?TAKE 1 TABLET(25 MG) BY MOUTH TWICE DAILY ?What changed: additional instructions ?Changed by: Claretta Fraise, MD ?  ?nitroGLYCERIN 0.4 MG SL tablet ?Commonly known as: NITROSTAT ?ONE TABLET UNDER TONGUE AS NEEDED FOR CHEST PAIN ?  ? ?  ? ? ? ?Follow-up: Return in about 6 months (around 08/05/2022). ? ?Claretta Fraise, M.D. ?

## 2022-02-03 LAB — CBC WITH DIFFERENTIAL/PLATELET
Basophils Absolute: 0.1 10*3/uL (ref 0.0–0.2)
Basos: 1 %
EOS (ABSOLUTE): 0.5 10*3/uL — ABNORMAL HIGH (ref 0.0–0.4)
Eos: 6 %
Hematocrit: 45.7 % (ref 37.5–51.0)
Hemoglobin: 15.8 g/dL (ref 13.0–17.7)
Immature Grans (Abs): 0 10*3/uL (ref 0.0–0.1)
Immature Granulocytes: 0 %
Lymphocytes Absolute: 2.1 10*3/uL (ref 0.7–3.1)
Lymphs: 24 %
MCH: 32.7 pg (ref 26.6–33.0)
MCHC: 34.6 g/dL (ref 31.5–35.7)
MCV: 95 fL (ref 79–97)
Monocytes Absolute: 0.9 10*3/uL (ref 0.1–0.9)
Monocytes: 10 %
Neutrophils Absolute: 5 10*3/uL (ref 1.4–7.0)
Neutrophils: 59 %
Platelets: 249 10*3/uL (ref 150–450)
RBC: 4.83 x10E6/uL (ref 4.14–5.80)
RDW: 13 % (ref 11.6–15.4)
WBC: 8.5 10*3/uL (ref 3.4–10.8)

## 2022-02-03 LAB — CMP14+EGFR
ALT: 19 IU/L (ref 0–44)
AST: 18 IU/L (ref 0–40)
Albumin/Globulin Ratio: 2.4 — ABNORMAL HIGH (ref 1.2–2.2)
Albumin: 4.7 g/dL (ref 3.8–4.8)
Alkaline Phosphatase: 80 IU/L (ref 44–121)
BUN/Creatinine Ratio: 15 (ref 10–24)
BUN: 14 mg/dL (ref 8–27)
Bilirubin Total: 0.4 mg/dL (ref 0.0–1.2)
CO2: 22 mmol/L (ref 20–29)
Calcium: 9.3 mg/dL (ref 8.6–10.2)
Chloride: 106 mmol/L (ref 96–106)
Creatinine, Ser: 0.96 mg/dL (ref 0.76–1.27)
Globulin, Total: 2 g/dL (ref 1.5–4.5)
Glucose: 96 mg/dL (ref 70–99)
Potassium: 4.9 mmol/L (ref 3.5–5.2)
Sodium: 142 mmol/L (ref 134–144)
Total Protein: 6.7 g/dL (ref 6.0–8.5)
eGFR: 89 mL/min/{1.73_m2} (ref >59)

## 2022-02-03 LAB — PSA, TOTAL AND FREE
PSA, Free Pct: 30 %
PSA, Free: 0.15 ng/mL
Prostate Specific Ag, Serum: 0.5 ng/mL (ref 0.0–4.0)

## 2022-02-03 NOTE — Progress Notes (Signed)
Hello Manning,  Your lab result is normal and/or stable.Some minor variations that are not significant are commonly marked abnormal, but do not represent any medical problem for you.  Best regards, Madalina Rosman, M.D.

## 2022-02-08 ENCOUNTER — Other Ambulatory Visit: Payer: Self-pay | Admitting: *Deleted

## 2022-02-08 DIAGNOSIS — F17210 Nicotine dependence, cigarettes, uncomplicated: Secondary | ICD-10-CM

## 2022-02-08 DIAGNOSIS — Z87891 Personal history of nicotine dependence: Secondary | ICD-10-CM

## 2022-03-09 ENCOUNTER — Ambulatory Visit (HOSPITAL_COMMUNITY)
Admission: RE | Admit: 2022-03-09 | Discharge: 2022-03-09 | Disposition: A | Discharge status: Home / Self Care | Payer: Medicare HMO | Source: Ambulatory Visit | Attending: Acute Care | Admitting: Acute Care

## 2022-03-09 DIAGNOSIS — F17210 Nicotine dependence, cigarettes, uncomplicated: Secondary | ICD-10-CM | POA: Insufficient documentation

## 2022-03-09 DIAGNOSIS — Z87891 Personal history of nicotine dependence: Secondary | ICD-10-CM | POA: Insufficient documentation

## 2022-03-10 ENCOUNTER — Other Ambulatory Visit: Payer: Self-pay

## 2022-03-10 DIAGNOSIS — Z87891 Personal history of nicotine dependence: Secondary | ICD-10-CM

## 2022-03-10 DIAGNOSIS — F17210 Nicotine dependence, cigarettes, uncomplicated: Secondary | ICD-10-CM

## 2022-03-10 DIAGNOSIS — Z122 Encounter for screening for malignant neoplasm of respiratory organs: Secondary | ICD-10-CM

## 2022-03-31 ENCOUNTER — Ambulatory Visit: Payer: Medicare HMO | Admitting: Neurology

## 2022-04-19 ENCOUNTER — Other Ambulatory Visit: Payer: Self-pay

## 2022-04-19 ENCOUNTER — Emergency Department (HOSPITAL_COMMUNITY)
Admission: EM | Admit: 2022-04-19 | Discharge: 2022-04-19 | Disposition: A | Discharge status: Home / Self Care | Payer: Medicare HMO | Attending: Emergency Medicine | Admitting: Emergency Medicine

## 2022-04-19 ENCOUNTER — Encounter (HOSPITAL_COMMUNITY): Payer: Self-pay

## 2022-04-19 ENCOUNTER — Emergency Department (HOSPITAL_COMMUNITY): Payer: Medicare HMO

## 2022-04-19 DIAGNOSIS — F17200 Nicotine dependence, unspecified, uncomplicated: Secondary | ICD-10-CM | POA: Insufficient documentation

## 2022-04-19 DIAGNOSIS — I2510 Atherosclerotic heart disease of native coronary artery without angina pectoris: Secondary | ICD-10-CM | POA: Insufficient documentation

## 2022-04-19 DIAGNOSIS — J069 Acute upper respiratory infection, unspecified: Secondary | ICD-10-CM | POA: Diagnosis not present

## 2022-04-19 DIAGNOSIS — Z79899 Other long term (current) drug therapy: Secondary | ICD-10-CM | POA: Diagnosis not present

## 2022-04-19 DIAGNOSIS — Z20822 Contact with and (suspected) exposure to covid-19: Secondary | ICD-10-CM | POA: Diagnosis not present

## 2022-04-19 DIAGNOSIS — R059 Cough, unspecified: Secondary | ICD-10-CM | POA: Diagnosis present

## 2022-04-19 DIAGNOSIS — Z7982 Long term (current) use of aspirin: Secondary | ICD-10-CM | POA: Insufficient documentation

## 2022-04-19 DIAGNOSIS — I10 Essential (primary) hypertension: Secondary | ICD-10-CM | POA: Insufficient documentation

## 2022-04-19 LAB — RESP PANEL BY RT-PCR (FLU A&B, COVID) ARPGX2
Influenza A by PCR: NEGATIVE
Influenza B by PCR: NEGATIVE
SARS Coronavirus 2 by RT PCR: NEGATIVE

## 2022-04-19 MED ORDER — ALBUTEROL SULFATE HFA 108 (90 BASE) MCG/ACT IN AERS
2.0000 | INHALATION_SPRAY | Freq: Four times a day (QID) | RESPIRATORY_TRACT | Status: DC | PRN
  Filled 2022-04-19: qty 6.7

## 2022-04-19 MED ORDER — ALBUTEROL SULFATE (2.5 MG/3ML) 0.083% IN NEBU
3.0000 mL | INHALATION_SOLUTION | RESPIRATORY_TRACT | Status: DC
  Administered 2022-04-19: 3 mL via RESPIRATORY_TRACT
  Filled 2022-04-19: qty 3

## 2022-04-19 MED ORDER — BENZONATATE 100 MG PO CAPS
200.0000 mg | ORAL_CAPSULE | Freq: Once | ORAL | Status: AC
  Administered 2022-04-19: 200 mg via ORAL
  Filled 2022-04-19: qty 2

## 2022-04-19 MED ORDER — BENZONATATE 100 MG PO CAPS: 100.0000 mg | ORAL_CAPSULE | Freq: Three times a day (TID) | ORAL | 0 refills | Status: DC

## 2022-04-19 NOTE — ED Notes (Signed)
Pt discharged, went over all instructions; Signature not obtained

## 2022-04-19 NOTE — Discharge Instructions (Signed)
The x-ray was totally normal, your COVID and flu test were also normal, your oxygen is normal however I do suspect that you have a viral upper respiratory illness which will likely take about 10 days to go away but sometimes can last up to 2 or 3 weeks.  Please take benzonatate 1 dose every 8 hours as needed for coughing, this can be used safely without the medication such as Robitussin or Mucinex.  You may use the albuterol inhaler 2 puffs every 4 hours as needed for shortness of breath  Thank you for allowing [place] to treat you in the emergency department today.  After reviewing your examination and potential testing that was done it appears that you are safe to go home.  I would like for you to follow-up with your doctor within the next several days, have them obtain your results and follow-up with them to review all of these tests.  If you should develop severe or worsening symptoms return to the emergency department immediately

## 2022-04-19 NOTE — ED Triage Notes (Signed)
Pt presents to ED with complaints of productive cough x 2 days, pt has had yellow sputum. Pt has taken Mucinex D

## 2022-04-19 NOTE — ED Notes (Signed)
Pt back from X-ray.  

## 2022-04-19 NOTE — ED Provider Notes (Signed)
[facility] EMERGENCY DEPARTMENT Provider Note   CSN: [id number] Arrival date & time: 04/19/22  3614     History  Chief Complaint  Patient presents with   Cough    Johnny Foster is a 66 y.o. male.   Cough  This patient is a 66 year old male who has a history of hypertension on lisinopril and metoprolol, high cholesterol on Lipitor, he does have a history of previously being intubated after developing pneumonia many years ago however since that time he has stopped smoking for the most part, the occasional cigarette.  He has moved from [place] here about 7 years ago, he had a CT scan about a month ago screening for lung cancer which was reassuring.  He reports that approximately 2 days ago he developed a soreness in his throat with a little bit of a drip or drainage and since that time his had some progressive cough.  He was using Mucinex D but felt like this was making his heart race, he does have a history of coronary disease with 2 stents.  No fevers no chills no swelling of the legs no body aches.  He states that he has been around some other people that were coughing but nobody that was close to them in his household.  Home Medications Prior to Admission medications   Medication Sig Start Date End Date Taking? Authorizing Provider  aspirin (ASPIRIN LOW DOSE) 81 MG EC tablet Take 1 tablet (81 mg total) by mouth daily. 02/02/22  Yes Mechele Claude, MD  atorvastatin (LIPITOR) 80 MG tablet Take 1 tablet (80 mg total) by mouth daily. 02/02/22  Yes Stacks, Broadus John, MD  benzonatate (TESSALON) 100 MG capsule Take 1 capsule (100 mg total) by mouth every 8 (eight) hours. 04/19/22  Yes Eber Hong, MD  lisinopril (ZESTRIL) 5 MG tablet TAKE 1 TABLET(5 MG) BY MOUTH DAILY 02/02/22  Yes Stacks, Broadus John, MD  metoprolol tartrate (LOPRESSOR) 25 MG tablet TAKE 1 TABLET(25 MG) BY MOUTH TWICE DAILY 02/02/22  Yes Stacks, Broadus John, MD  nitroGLYCERIN (NITROSTAT) 0.4 MG SL tablet ONE TABLET UNDER TONGUE AS  NEEDED FOR CHEST PAIN 09/17/21  Yes Mechele Claude, MD      Allergies    Patient has no known allergies.    Review of Systems   Review of Systems  Respiratory:  Positive for cough.   All other systems reviewed and are negative.  Physical Exam Updated Vital Signs BP 136/89   Pulse 67   Temp 98.2 F (36.8 C) (Oral)   Resp 18   Ht 1.778 m (5\' 10" )   Wt 88.9 kg   SpO2 97%   BMI 28.12 kg/m  Physical Exam Vitals and nursing note reviewed.  Constitutional:      General: He is not in acute distress.    Appearance: He is well-developed.  HENT:     Head: Normocephalic and atraumatic.     Mouth/Throat:     Pharynx: No oropharyngeal exudate.     Comments: OP clear, no erythema, no exudate - nasal passages clear Eyes:     General: No scleral icterus.       Right eye: No discharge.        Left eye: No discharge.     Conjunctiva/sclera: Conjunctivae normal.     Pupils: Pupils are equal, round, and reactive to light.  Neck:     Thyroid: No thyromegaly.     Vascular: No JVD.  Cardiovascular:     Rate and Rhythm: Normal rate and  regular rhythm.     Heart sounds: Normal heart sounds. No murmur heard.   No friction rub. No gallop.  Pulmonary:     Effort: Pulmonary effort is normal. No respiratory distress.     Breath sounds: Normal breath sounds. No wheezing or rales.  Abdominal:     General: Bowel sounds are normal. There is no distension.     Palpations: Abdomen is soft. There is no mass.     Tenderness: There is no abdominal tenderness.  Musculoskeletal:        General: No tenderness. Normal range of motion.     Cervical back: Normal range of motion and neck supple.     Right lower leg: No edema.     Left lower leg: No edema.  Lymphadenopathy:     Cervical: No cervical adenopathy.  Skin:    General: Skin is warm and dry.     Findings: No erythema or rash.  Neurological:     General: No focal deficit present.     Mental Status: He is alert.     Coordination:  Coordination normal.  Psychiatric:        Behavior: Behavior normal.    ED Results / Procedures / Treatments   Labs (all labs ordered are listed, but only abnormal results are displayed) Labs Reviewed  RESP PANEL BY RT-PCR (FLU A&B, COVID) [id number]    EKG None  Radiology DG Chest 2 View  Result Date: 04/19/2022 CLINICAL DATA:  66 year old male with productive cough for 2 days. Smoker. EXAM: CHEST - 2 VIEW COMPARISON:  Screening chest CT 03/09/2022 and earlier. FINDINGS: PA and lateral views. Lung volumes and lung markings appear stable since 2021. Normal cardiac size and mediastinal contours. Visualized tracheal air column is within normal limits. No pneumothorax or pleural effusion. No acute osseous abnormality identified. Negative visible bowel gas. IMPRESSION: No acute cardiopulmonary abnormality. Electronically Signed   By: Odessa FlemingH  Hall M.D.   On: 04/19/2022 07:49    Procedures Procedures    Medications Ordered in ED Medications  albuterol (PROVENTIL) (2.5 MG/3ML) 0.083% nebulizer solution 3 mL (has no administration in time range)  benzonatate (TESSALON) capsule 200 mg (200 mg Oral Given 04/19/22 0732)    ED Course/ Medical Decision Making/ A&P                           Medical Decision Making Amount and/or Complexity of Data Reviewed Radiology: ordered.  Risk Prescription drug management.   This patient presents to the ED for concern of cough differential diagnosis includes viral illness, possible bacterial pneumonia though seems less likely given how well he appears    Additional history obtained:  Additional history obtained from electronic medical record External records from outside source obtained and reviewed including CT scan of the chest which was performed a month ago showing no signs of lung cancer   Lab Tests:  I Ordered, and personally interpreted labs.  The pertinent results include: COVID test was sent, results pending at the time of  discharge   Imaging Studies ordered:  I ordered imaging studies including chest x-ray I independently visualized and interpreted imaging which showed no pneumonia, lungs are clear I agree with the radiologist interpretation   Medicines ordered and prescription drug management:  I ordered medication including benzonatate and albuterol for cough and upper respiratory illness Reevaluation of the patient after these medicines showed that the patient improved I have reviewed the patients home medicines and  have made adjustments as needed   Problem List / ED Course:  Patient appears well, very stable appearing, anticipate discharge   Social Determinants of Health:  Ongoing smoking occasionally, getting better over time, patient given additional counseling.           Final Clinical Impression(s) / ED Diagnoses Final diagnoses:  Viral URI with cough    Rx / DC Orders ED Discharge Orders          Ordered    benzonatate (TESSALON) 100 MG capsule  Every 8 hours        04/19/22 0945              Eber Hong, MD 04/19/22 [phone]

## 2022-05-05 ENCOUNTER — Ambulatory Visit: Payer: Medicare HMO | Admitting: Neurology

## 2022-07-11 ENCOUNTER — Ambulatory Visit: Payer: Medicare HMO | Admitting: Neurology

## 2022-07-11 ENCOUNTER — Encounter: Payer: Self-pay | Admitting: Neurology

## 2022-08-29 NOTE — Progress Notes (Unsigned)
Cardiology Office Note   Date:  08/29/2022   ID:  Krystopher Kuenzel, DOB 1956/04/17, MRN [id number]  PCP:  Mechele Claude, MD  Cardiologist:   Rollene Rotunda, MD     No chief complaint on file.     History of Present Illness: Johnny Foster is a 66 y.o. male who presents for follow up of CAD  He had a cardiac cath in June 2019 in [place] after presenting with a acute inferolateral MI.  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  EF was normal.     Since I last saw him ***   *** he has done very well.  He stays very active and even worked to put it back on.  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 The patient denies any new symptoms such as chest discomfort, neck or arm discomfort. There has been no new shortness of breath, PND or orthopnea. There have been no reported palpitations, presyncope or syncope.   He did have a kidney stone in early December and went to the emergency room and I did review these records.  An EKG was done and was unremarkable.   Past Medical History:  Diagnosis Date   Hepatitis C    Treated   Hyperlipidemia    Hypertension    Iron deficiency anemia 10/02/2018   Myocardial infarction [facility])    May 2019.  LAD moderate 60 to 80% stenosis.  Circumflex OM occluded.  RCA 80% stenosis.  OM was thought to be a acute infarct vessel.  It was treated with a Synergy stent.  This is a 2.5 x 20 mm, RCA was treated with a 3.  3.0 by 16 mm Synergy.  Elective PCI in June of an LAD lesion with 3.0 x 38 mm Synergy.   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 90  tablet 3   atorvastatin (LIPITOR) 80 MG tablet Take 1 tablet (80 mg total) by mouth daily. 90 tablet 3   benzonatate (TESSALON) 100 MG capsule Take 1 capsule (100 mg total) by mouth every 8 (eight) hours. 21 capsule 0   lisinopril (ZESTRIL) 5 MG tablet TAKE 1 TABLET(5 MG) BY MOUTH DAILY 90 tablet 3   metoprolol tartrate (LOPRESSOR) 25 MG tablet TAKE 1 TABLET(25 MG) BY MOUTH TWICE DAILY 180 tablet 3   nitroGLYCERIN (NITROSTAT) 0.4 MG SL tablet ONE TABLET UNDER TONGUE AS NEEDED FOR CHEST PAIN 25 tablet prn   No current facility-administered medications for this visit.    Allergies:   Patient has no known allergies.    ROS:  Please see the history of present illness.   Otherwise, review of systems are positive for ***.   All other systems are reviewed and negative.    PHYSICAL EXAM: VS:  There were no vitals taken for this visit. , BMI There is no height or weight on file to calculate BMI. GENERAL:  Well appearing NECK:  No jugular venous distention, waveform within normal limits, carotid upstroke brisk and symmetric, no bruits, no  thyromegaly LUNGS:  Clear to auscultation bilaterally CHEST:  Unremarkable HEART:  PMI not displaced or sustained,S1 and S2 within normal limits, no S3, no S4, no clicks, no rubs, *** murmurs ABD:  Flat, positive bowel sounds normal in frequency in pitch, no bruits, no rebound, no guarding, no midline pulsatile mass, no hepatomegaly, no splenomegaly EXT:  2 plus pulses throughout, no edema, no cyanosis no clubbing     ***GENERAL:  Well appearing NECK:  No jugular venous distention, waveform within normal limits, carotid upstroke brisk and symmetric, no bruits, no thyromegaly LUNGS:  Clear to auscultation bilaterally CHEST:  Unremarkable HEART:  PMI not displaced or sustained,S1 and S2 within normal limits, no S3, no S4, no clicks, no rubs, no murmurs ABD:  Flat, positive bowel sounds normal in frequency in pitch, no bruits, no rebound, no guarding, no  midline pulsatile mass, no hepatomegaly, no splenomegaly EXT:  2 plus pulses throughout, no edema, no cyanosis no clubbing   EKG:  EKG is *** ordered today. The ekg ordered *** demonstrates sinus rhythm, rate ***, axis within normal limits, intervals within normal limits, early repolarization changes unchanged from previous.   Recent Labs: 02/02/2022: ALT 19; BUN 14; Creatinine, Ser 0.96; Hemoglobin 15.8; Platelets 249; Potassium 4.9; Sodium 142    Lipid Panel    Component Value Date/Time   CHOL 129 12/08/2021 1047   TRIG 91 12/08/2021 1047   HDL 56 12/08/2021 1047   CHOLHDL 2.3 12/08/2021 1047   LDLCALC 56 12/08/2021 1047      Wt Readings from Last 3 Encounters:  04/19/22 196 lb (88.9 kg)  02/02/22 201 lb (91.2 kg)  11/24/21 197 lb (89.4 kg)      Other studies Reviewed: Additional studies/ records that were reviewed today include: *** Review of the above records demonstrates:  Please see elsewhere in the note.     ASSESSMENT AND PLAN:  CAD:  *** The patient has no new sypmtoms.  No further cardiovascular testing is indicated.  We will continue with aggressive risk reduction and meds as listed.  TOBACCO ABUSE :   ***    He is unfortunately still smoking half a pack per day and we talked about the need to have complete cessation.   DYSLIPIDEMIA:    LDL was *** checked since November 2021 and I will have him come back for fasting lipid profile.    Current medicines are reviewed at length with the patient today.  The patient does not have concerns regarding medicines.  The following changes have been made:  ***  Labs/ tests ordered today include:  ***  No orders of the defined types were placed in this encounter.    Disposition:   FU with in ***  months.     Signed, Minus Breeding, MD  08/29/2022 8:24 PM    [place]

## 2022-08-31 ENCOUNTER — Other Ambulatory Visit: Payer: Self-pay | Admitting: *Deleted

## 2022-08-31 ENCOUNTER — Encounter: Payer: Self-pay | Admitting: Cardiology

## 2022-08-31 ENCOUNTER — Ambulatory Visit: Payer: Medicare HMO | Admitting: Cardiology

## 2022-08-31 VITALS — BP 142/78 | HR 64 | Ht 69.0 in | Wt 191.0 lb

## 2022-08-31 DIAGNOSIS — Z79899 Other long term (current) drug therapy: Secondary | ICD-10-CM

## 2022-08-31 DIAGNOSIS — Z720 Tobacco use: Secondary | ICD-10-CM

## 2022-08-31 DIAGNOSIS — I2510 Atherosclerotic heart disease of native coronary artery without angina pectoris: Secondary | ICD-10-CM | POA: Diagnosis not present

## 2022-08-31 DIAGNOSIS — E785 Hyperlipidemia, unspecified: Secondary | ICD-10-CM | POA: Diagnosis not present

## 2022-08-31 NOTE — Patient Instructions (Signed)
Medication Instructions:  The current medical regimen is effective;  continue present plan and medications.  *If you need a refill on your cardiac medications before your next appointment, please call your pharmacy*   Lab Work: Please have Lipid panel in January 2024 at Dr Livia Snellen office.  If you have labs (blood work) drawn today and your tests are completely normal, you will receive your results only by: [place] (if you have MyChart) OR A paper copy in the mail If you have any lab test that is abnormal or we need to change your treatment, we will call you to review the results.   Follow-Up: At [facility], you and your health needs are our priority.  As part of our continuing mission to provide you with exceptional heart care, we have created designated Provider Care Teams.  These Care Teams include your primary Cardiologist (physician) and Advanced Practice Providers (APPs -  Physician Assistants and Nurse Practitioners) who all work together to provide you with the care you need, when you need it.  We recommend signing up for the patient portal called "MyChart".  Sign up information is provided on this After Visit Summary.  MyChart is used to connect with patients for Virtual Visits (Telemedicine).  Patients are able to view lab/test results, encounter notes, upcoming appointments, etc.  Non-urgent messages can be sent to your provider as well.   To learn more about what you can do with MyChart, go to [email or web address].    Your next appointment:   6 month(s)  The format for your next appointment:   In Person  Provider:   Minus Breeding, MD     Important Information About Sugar

## 2022-10-28 ENCOUNTER — Ambulatory Visit (INDEPENDENT_AMBULATORY_CARE_PROVIDER_SITE_OTHER): Payer: Medicare HMO

## 2022-10-28 VITALS — Ht 70.0 in | Wt 194.0 lb

## 2022-10-28 DIAGNOSIS — Z0100 Encounter for examination of eyes and vision without abnormal findings: Secondary | ICD-10-CM

## 2022-10-28 DIAGNOSIS — Z0000 Encounter for general adult medical examination without abnormal findings: Secondary | ICD-10-CM | POA: Diagnosis not present

## 2022-10-28 NOTE — Progress Notes (Signed)
Subjective:   Johnny Foster is a 66 y.o. male who presents for Medicare Annual/Subsequent preventive examination. I connected with  Antoni Stefan on 10/28/22 by a audio enabled telemedicine application and verified that I am speaking with the correct person using two identifiers.  Patient Location: Home  Provider Location: Home Office  I discussed the limitations of evaluation and management by telemedicine. The patient expressed understanding and agreed to proceed.  Review of Systems     Cardiac Risk Factors include: advanced age (>42men, >65 women);male gender;hypertension     Objective:    Today's Vitals   10/28/22 1354  Weight: 194 lb (88 kg)  Height: 5\' 10"  (1.778 m)   Body mass index is 27.84 kg/m.     10/28/2022    1:57 PM 04/19/2022    7:10 AM 11/23/2021    8:32 AM 10/22/2021    3:36 AM 09/27/2021    8:28 AM 10/16/2020    9:42 AM 12/07/2019    1:56 PM  Advanced Directives  Does Patient Have a Medical Advance Directive? No No No No Yes No No  Type of Advance Directive     Living will    Would patient like information on creating a medical advance directive? No - Patient declined   No - Patient declined No - Patient declined No - Patient declined No - Patient declined    Current Medications (verified) Outpatient Encounter Medications as of 10/28/2022  Medication Sig   aspirin (ASPIRIN LOW DOSE) 81 MG EC tablet Take 1 tablet (81 mg total) by mouth daily.   atorvastatin (LIPITOR) 80 MG tablet Take 1 tablet (80 mg total) by mouth daily.   lisinopril (ZESTRIL) 5 MG tablet TAKE 1 TABLET(5 MG) BY MOUTH DAILY   metoprolol tartrate (LOPRESSOR) 25 MG tablet TAKE 1 TABLET(25 MG) BY MOUTH TWICE DAILY   nitroGLYCERIN (NITROSTAT) 0.4 MG SL tablet ONE TABLET UNDER TONGUE AS NEEDED FOR CHEST PAIN   No facility-administered encounter medications on file as of 10/28/2022.    Allergies (verified) Patient has no known allergies.   History: Past Medical History:  Diagnosis  Date   Hepatitis C    Treated   Hyperlipidemia    Hypertension    Iron deficiency anemia 10/02/2018   Myocardial infarction [facility])    May 2019.  LAD moderate 60 to 80% stenosis.  Circumflex OM occluded.  RCA 80% stenosis.  OM was thought to be a acute infarct vessel.  It was treated with a Synergy stent.  This is a 2.5 x 20 mm, RCA was treated with a 3.  3.0 by 16 mm Synergy.  Elective PCI in June of an LAD lesion with 3.0 x 38 mm Synergy.   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   Past Surgical History:  Procedure Laterality Date   CARDIAC VALVE SURGERY     TONSILECTOMY, ADENOIDECTOMY, BILATERAL MYRINGOTOMY AND TUBES  1969   Family History  Problem Relation Age of Onset   Arthritis Mother    CAD Mother 47   Lung disease Father    Cancer Brother    Social History   Socioeconomic History   Marital status: Married    Spouse name: Not on file   Number of children: 2   Years of education: Not on file   Highest education level: Not on file  Occupational History   Occupation: retired  Tobacco Use   Smoking status: Every Day    Packs/day: 0.25  Years: 40.00    Total pack years: 10.00    Types: Cigarettes   Smokeless tobacco: Never  Vaping Use   Vaping Use: Never used  Substance and Sexual Activity   Alcohol use: Yes    Comment: rarely   Drug use: Not Currently    Types: Other-see comments    Comment: history of Heroin and Cocaine use in his teens    Sexual activity: Yes  Other Topics Concern   Not on file  Social History Narrative   2 daughters and 5 grandchildren   Social Determinants of Health   Financial Resource Strain: Low Risk  (10/28/2022)   Overall Financial Resource Strain (CARDIA)    Difficulty of Paying Living Expenses: Not hard at all  Food Insecurity: No Food Insecurity (10/28/2022)   Hunger Vital Sign    Worried About Running Out of Food in the Last Year: Never true    Ran Out of Food in the Last Year: Never true   Transportation Needs: No Transportation Needs (10/28/2022)   PRAPARE - Administrator, Civil Service (Medical): No    Lack of Transportation (Non-Medical): No  Physical Activity: Sufficiently Active (10/28/2022)   Exercise Vital Sign    Days of Exercise per Week: 5 days    Minutes of Exercise per Session: 30 min  Stress: No Stress Concern Present (10/28/2022)   [employer] of Occupational Health - Occupational Stress Questionnaire    Feeling of Stress : Not at all  Social Connections: Socially Integrated (10/28/2022)   Social Connection and Isolation Panel [NHANES]    Frequency of Communication with Friends and Family: More than three times a week    Frequency of Social Gatherings with Friends and Family: More than three times a week    Attends Religious Services: More than 4 times per year    Active Member of [employer] or Organizations: Yes    Attends Engineer, structural: More than 4 times per year    Marital Status: Married    Tobacco Counseling Ready to quit: Not Answered Counseling given: Not Answered   Clinical Intake:  Pre-visit preparation completed: Yes  Pain : No/denies pain     Nutritional Risks: None Diabetes: No  How often do you need to have someone help you when you read instructions, pamphlets, or other written materials from your doctor or pharmacy?: 1 - Never  Diabetic?no   Interpreter Needed?: No  Information entered by :: Renie Ora, LPN   Activities of Daily Living    10/28/2022    1:57 PM  In your present state of health, do you have any difficulty performing the following activities:  Hearing? 0  Vision? 0  Difficulty concentrating or making decisions? 0  Walking or climbing stairs? 0  Dressing or bathing? 0  Doing errands, shopping? 0  Preparing Food and eating ? N  Using the Toilet? N  In the past six months, have you accidently leaked urine? N  Do you have problems with loss of bowel control? N  Managing your  Medications? N  Managing your Finances? N  Housekeeping or managing your Housekeeping? N    Patient Care Team: Mechele Claude, MD as PCP - General (Family Medicine) Rollene Rotunda, MD as PCP - Cardiology (Cardiology)  Indicate any recent Medical Services you may have received from other than [facility] providers in the past year (date may be approximate).     Assessment:   This is a routine wellness examination for [place].  Hearing/Vision screen Vision Screening - Comments:: Due patient to schedule   Dietary issues and exercise activities discussed: Current Exercise Habits: Home exercise routine, Type of exercise: walking, Time (Minutes): 30, Frequency (Times/Week): 5, Weekly Exercise (Minutes/Week): 150, Intensity: Mild, Exercise limited by: None identified   Goals Addressed             This Visit's Progress    Quit Smoking   On track      Depression Screen    10/28/2022    1:56 PM 02/02/2022    9:37 AM 10/06/2021    8:54 AM 09/27/2021    8:17 AM 08/03/2021   10:06 AM 05/25/2021    8:39 AM 09/22/2020   10:57 AM  PHQ 2/9 Scores  PHQ - 2 Score 0 0 0 0 0 0 0    Fall Risk    10/28/2022    1:55 PM 02/02/2022    9:37 AM 10/06/2021    8:54 AM 09/27/2021    8:29 AM 08/03/2021   10:06 AM  Fall Risk   Falls in the past year? 0 0 0 0 0  Number falls in past yr: 0   0   Injury with Fall? 0   0   Risk for fall due to : No Fall Risks   No Fall Risks   Follow up Falls prevention discussed   Falls prevention discussed     FALL RISK PREVENTION PERTAINING TO THE HOME:  Any stairs in or around the home? Yes  If so, are there any without handrails? No  Home free of loose throw rugs in walkways, pet beds, electrical cords, etc? Yes  Adequate lighting in your home to reduce risk of falls? Yes   ASSISTIVE DEVICES UTILIZED TO PREVENT FALLS:  Life alert? No  Use of a cane, walker or w/c? No  Grab bars in the bathroom? Yes  Shower chair or bench in shower? Yes  Elevated toilet seat  or a handicapped toilet? Yes        10/28/2022    1:57 PM  6CIT Screen  What Year? 0 points  What month? 0 points  What time? 0 points  Count back from 20 0 points  Months in reverse 0 points  Repeat phrase 0 points  Total Score 0 points    Immunizations Immunization History  Administered Date(s) Administered   Influenza,inj,Quad PF,6+ Mos 09/05/2018, 08/02/2019, 08/24/2020, 10/04/2021   Moderna Covid-19 Vaccine Bivalent Booster 1038yrs & up 11/04/2020   Moderna Sars-Covid-2 Vaccination 12/06/2019, 01/06/2020    TDAP status: Up to date  Flu Vaccine status: Up to date  Pneumococcal vaccine status: Due, Education has been provided regarding the importance of this vaccine. Advised may receive this vaccine at local pharmacy or Health Dept. Aware to provide a copy of the vaccination record if obtained from local pharmacy or Health Dept. Verbalized acceptance and understanding.  Covid-19 vaccine status: Completed vaccines  Qualifies for Shingles Vaccine? Yes   Zostavax completed No   Shingrix Completed?: No.    Education has been provided regarding the importance of this vaccine. Patient has been advised to call insurance company to determine out of pocket expense if they have not yet received this vaccine. Advised may also receive vaccine at local pharmacy or Health Dept. Verbalized acceptance and understanding.  Screening Tests Health Maintenance  Topic Date Due   DTaP/Tdap/Td (1 - Tdap) Never done   Zoster Vaccines- Shingrix (1 of 2) Never done   INFLUENZA VACCINE  06/21/2022  COVID-19 Vaccine (4 - 2023-24 season) 07/22/2022   Medicare Annual Wellness (AWV)  10/29/2023   COLONOSCOPY (Pts 45-28yrs Insurance coverage will need to be confirmed)  03/05/2025   Hepatitis C Screening  Completed   HIV Screening  Completed   HPV VACCINES  Aged Out    Health Maintenance  Health Maintenance Due  Topic Date Due   DTaP/Tdap/Td (1 - Tdap) Never done   Zoster Vaccines- Shingrix (1  of 2) Never done   INFLUENZA VACCINE  06/21/2022   COVID-19 Vaccine (4 - 2023-24 season) 07/22/2022    Colorectal cancer screening: Type of screening: Colonoscopy. Completed 03/06/2015. Repeat every 10 years  Lung Cancer Screening: (Low Dose CT Chest recommended if Age 22-80 years, 30 pack-year currently smoking OR have quit w/in 15years.) does qualify.   Lung Cancer Screening Referral: Completed 2023  per patient   Additional Screening:  Hepatitis C Screening: does not qualify;  Vision Screening: Recommended annual ophthalmology exams for early detection of glaucoma and other disorders of the eye. Is the patient up to date with their annual eye exam?  No  Who is the provider or what is the name of the office in which the patient attends annual eye exams? None patient to schedule  If pt is not established with a provider, would they like to be referred to a provider to establish care? No .   Dental Screening: Recommended annual dental exams for proper oral hygiene  Community Resource Referral / Chronic Care Management: CRR required this visit?  No   CCM required this visit?  No      Plan:     I have personally reviewed and noted the following in the patient's chart:   Medical and social history Use of alcohol, tobacco or illicit drugs  Current medications and supplements including opioid prescriptions. Patient is not currently taking opioid prescriptions. Functional ability and status Nutritional status Physical activity Advanced directives List of other physicians Hospitalizations, surgeries, and ER visits in previous 12 months Vitals Screenings to include cognitive, depression, and falls Referrals and appointments  In addition, I have reviewed and discussed with patient certain preventive protocols, quality metrics, and best practice recommendations. A written personalized care plan for preventive services as well as general preventive health recommendations were  provided to patient.     Lorrene Reid, LPN   25/02/9825   Nurse Notes: Patient to schedule eye appointment referral 10/27/2022

## 2022-10-28 NOTE — Patient Instructions (Signed)
Mr. Johnny Foster , Thank you for taking time to come for your Medicare Wellness Visit. I appreciate your ongoing commitment to your health goals. Please review the following plan we discussed and let me know if I can assist you in the future.   These are the goals we discussed:  Goals      Quit Smoking        This is a list of the screening recommended for you and due dates:  Health Maintenance  Topic Date Due   DTaP/Tdap/Td vaccine (1 - Tdap) Never done   Zoster (Shingles) Vaccine (1 of 2) Never done   Flu Shot  06/21/2022   COVID-19 Vaccine (4 - 2023-24 season) 07/22/2022   Medicare Annual Wellness Visit  10/29/2023   Colon Cancer Screening  03/05/2025   Hepatitis C Screening: USPSTF Recommendation to screen - Ages 18-79 yo.  Completed   HIV Screening  Completed   HPV Vaccine  Aged Out    Advanced directives: Advance directive discussed with you today. I have provided a copy for you to complete at home and have notarized. Once this is complete please bring a copy in to our office so we can scan it into your chart.   Conditions/risks identified: Aim for 30 minutes of exercise or brisk walking, 6-8 glasses of water, and 5 servings of fruits and vegetables each day.   Next appointment: Follow up in one year for your annual wellness visit.   Preventive Care 64 Years and Older, Male  Preventive care refers to lifestyle choices and visits with your health care provider that can promote health and wellness. What does preventive care include? A yearly physical exam. This is also called an annual well check. Dental exams once or twice a year. Routine eye exams. Ask your health care provider how often you should have your eyes checked. Personal lifestyle choices, including: Daily care of your teeth and gums. Regular physical activity. Eating a healthy diet. Avoiding tobacco and drug use. Limiting alcohol use. Practicing safe sex. Taking low doses of aspirin every day. Taking vitamin  and mineral supplements as recommended by your health care provider. What happens during an annual well check? The services and screenings done by your health care provider during your annual well check will depend on your age, overall health, lifestyle risk factors, and family history of disease. Counseling  Your health care provider may ask you questions about your: Alcohol use. Tobacco use. Drug use. Emotional well-being. Home and relationship well-being. Sexual activity. Eating habits. History of falls. Memory and ability to understand (cognition). Work and work Astronomer. Screening  You may have the following tests or measurements: Height, weight, and BMI. Blood pressure. Lipid and cholesterol levels. These may be checked every 5 years, or more frequently if you are over 30 years old. Skin check. Lung cancer screening. You may have this screening every year starting at age 23 if you have a 30-pack-year history of smoking and currently smoke or have quit within the past 15 years. Fecal occult blood test (FOBT) of the stool. You may have this test every year starting at age 54. Flexible sigmoidoscopy or colonoscopy. You may have a sigmoidoscopy every 5 years or a colonoscopy every 10 years starting at age 75. Prostate cancer screening. Recommendations will vary depending on your family history and other risks. Hepatitis C blood test. Hepatitis B blood test. Sexually transmitted disease (STD) testing. Diabetes screening. This is done by checking your blood sugar (glucose) after you have not  eaten for a while (fasting). You may have this done every 1-3 years. Abdominal aortic aneurysm (AAA) screening. You may need this if you are a current or former smoker. Osteoporosis. You may be screened starting at age 47 if you are at high risk. Talk with your health care provider about your test results, treatment options, and if necessary, the need for more tests. Vaccines  Your health care  provider may recommend certain vaccines, such as: Influenza vaccine. This is recommended every year. Tetanus, diphtheria, and acellular pertussis (Tdap, Td) vaccine. You may need a Td booster every 10 years. Zoster vaccine. You may need this after age 100. Pneumococcal 13-valent conjugate (PCV13) vaccine. One dose is recommended after age 76. Pneumococcal polysaccharide (PPSV23) vaccine. One dose is recommended after age 81. Talk to your health care provider about which screenings and vaccines you need and how often you need them. This information is not intended to replace advice given to you by your health care provider. Make sure you discuss any questions you have with your health care provider. Document Released: 12/04/2015 Document Revised: 07/27/2016 Document Reviewed: 09/08/2015 Elsevier Interactive Patient Education  2017 [place] Prevention in the Home Falls can cause injuries. They can happen to people of all ages. There are many things you can do to make your home safe and to help prevent falls. What can I do on the outside of my home? Regularly fix the edges of walkways and driveways and fix any cracks. Remove anything that might make you trip as you walk through a door, such as a raised step or threshold. Trim any bushes or trees on the path to your home. Use bright outdoor lighting. Clear any walking paths of anything that might make someone trip, such as rocks or tools. Regularly check to see if handrails are loose or broken. Make sure that both sides of any steps have handrails. Any raised decks and porches should have guardrails on the edges. Have any leaves, snow, or ice cleared regularly. Use sand or salt on walking paths during winter. Clean up any spills in your garage right away. This includes oil or grease spills. What can I do in the bathroom? Use night lights. Install grab bars by the toilet and in the tub and shower. Do not use towel bars as grab  bars. Use non-skid mats or decals in the tub or shower. If you need to sit down in the shower, use a plastic, non-slip stool. Keep the floor dry. Clean up any water that spills on the floor as soon as it happens. Remove soap buildup in the tub or shower regularly. Attach bath mats securely with double-sided non-slip rug tape. Do not have throw rugs and other things on the floor that can make you trip. What can I do in the bedroom? Use night lights. Make sure that you have a light by your bed that is easy to reach. Do not use any sheets or blankets that are too big for your bed. They should not hang down onto the floor. Have a firm chair that has side arms. You can use this for support while you get dressed. Do not have throw rugs and other things on the floor that can make you trip. What can I do in the kitchen? Clean up any spills right away. Avoid walking on wet floors. Keep items that you use a lot in easy-to-reach places. If you need to reach something above you, use a strong step stool that  has a grab bar. Keep electrical cords out of the way. Do not use floor polish or wax that makes floors slippery. If you must use wax, use non-skid floor wax. Do not have throw rugs and other things on the floor that can make you trip. What can I do with my stairs? Do not leave any items on the stairs. Make sure that there are handrails on both sides of the stairs and use them. Fix handrails that are broken or loose. Make sure that handrails are as long as the stairways. Check any carpeting to make sure that it is firmly attached to the stairs. Fix any carpet that is loose or worn. Avoid having throw rugs at the top or bottom of the stairs. If you do have throw rugs, attach them to the floor with carpet tape. Make sure that you have a light switch at the top of the stairs and the bottom of the stairs. If you do not have them, ask someone to add them for you. What else can I do to help prevent  falls? Wear shoes that: Do not have high heels. Have rubber bottoms. Are comfortable and fit you well. Are closed at the toe. Do not wear sandals. If you use a stepladder: Make sure that it is fully opened. Do not climb a closed stepladder. Make sure that both sides of the stepladder are locked into place. Ask someone to hold it for you, if possible. Clearly mark and make sure that you can see: Any grab bars or handrails. First and last steps. Where the edge of each step is. Use tools that help you move around (mobility aids) if they are needed. These include: Canes. Walkers. Scooters. Crutches. Turn on the lights when you go into a dark area. Replace any light bulbs as soon as they burn out. Set up your furniture so you have a clear path. Avoid moving your furniture around. If any of your floors are uneven, fix them. If there are any pets around you, be aware of where they are. Review your medicines with your doctor. Some medicines can make you feel dizzy. This can increase your chance of falling. Ask your doctor what other things that you can do to help prevent falls. This information is not intended to replace advice given to you by your health care provider. Make sure you discuss any questions you have with your health care provider. Document Released: 09/03/2009 Document Revised: 04/14/2016 Document Reviewed: 12/12/2014 Elsevier Interactive Patient Education  2017 [employer].

## 2022-11-17 ENCOUNTER — Ambulatory Visit (INDEPENDENT_AMBULATORY_CARE_PROVIDER_SITE_OTHER): Payer: Medicare HMO

## 2022-11-17 DIAGNOSIS — Z23 Encounter for immunization: Secondary | ICD-10-CM

## 2023-01-08 ENCOUNTER — Other Ambulatory Visit: Payer: Self-pay | Admitting: Acute Care

## 2023-01-08 DIAGNOSIS — Z87891 Personal history of nicotine dependence: Secondary | ICD-10-CM

## 2023-01-08 DIAGNOSIS — Z122 Encounter for screening for malignant neoplasm of respiratory organs: Secondary | ICD-10-CM

## 2023-01-08 DIAGNOSIS — F17210 Nicotine dependence, cigarettes, uncomplicated: Secondary | ICD-10-CM

## 2023-02-04 ENCOUNTER — Other Ambulatory Visit: Payer: Self-pay | Admitting: Family Medicine

## 2023-02-06 ENCOUNTER — Other Ambulatory Visit: Payer: Self-pay | Admitting: Family Medicine

## 2023-02-06 NOTE — Telephone Encounter (Signed)
Appt made for 03/09/23

## 2023-02-06 NOTE — Telephone Encounter (Signed)
30 days sent today - ntbs  

## 2023-02-14 ENCOUNTER — Other Ambulatory Visit: Payer: Self-pay | Admitting: Family Medicine

## 2023-02-14 DIAGNOSIS — I2510 Atherosclerotic heart disease of native coronary artery without angina pectoris: Secondary | ICD-10-CM

## 2023-03-09 ENCOUNTER — Telehealth: Payer: Self-pay | Admitting: Family Medicine

## 2023-03-09 ENCOUNTER — Ambulatory Visit: Payer: Medicare HMO | Admitting: Family Medicine

## 2023-03-09 NOTE — Telephone Encounter (Signed)
Okay if okay with Dr. Louanne Skye.

## 2023-03-09 NOTE — Telephone Encounter (Signed)
Left message for patient to call back to schedule his next appt with Dr Dettinger. Needs to be 30 min since he is re-establishing care. Offered him 6/27 on the voicemail if still available when pt calls back.

## 2023-03-09 NOTE — Telephone Encounter (Signed)
Yes I am fine with that if his wife sees me then I am okay with that.

## 2023-03-13 ENCOUNTER — Encounter: Payer: Self-pay | Admitting: Family Medicine

## 2023-03-13 ENCOUNTER — Ambulatory Visit (HOSPITAL_COMMUNITY): Admission: RE | Admit: 2023-03-13 | Payer: Medicare HMO | Source: Ambulatory Visit

## 2023-04-25 ENCOUNTER — Ambulatory Visit: Payer: Medicare HMO | Admitting: Family Medicine

## 2023-04-25 ENCOUNTER — Encounter: Payer: Self-pay | Admitting: Family Medicine

## 2023-04-25 VITALS — BP 135/68 | HR 56 | Temp 97.9°F | Ht 70.0 in | Wt 193.4 lb

## 2023-04-25 DIAGNOSIS — I10 Essential (primary) hypertension: Secondary | ICD-10-CM | POA: Diagnosis not present

## 2023-04-25 DIAGNOSIS — E782 Mixed hyperlipidemia: Secondary | ICD-10-CM

## 2023-04-25 DIAGNOSIS — I2510 Atherosclerotic heart disease of native coronary artery without angina pectoris: Secondary | ICD-10-CM

## 2023-04-25 DIAGNOSIS — Z125 Encounter for screening for malignant neoplasm of prostate: Secondary | ICD-10-CM

## 2023-04-25 MED ORDER — METOPROLOL TARTRATE 25 MG PO TABS: ORAL_TABLET | ORAL | 3 refills | Status: DC

## 2023-04-25 MED ORDER — LISINOPRIL 5 MG PO TABS
ORAL_TABLET | ORAL | 3 refills | Status: DC
Start: 2023-04-25 — End: 2023-09-11

## 2023-04-25 MED ORDER — ATORVASTATIN CALCIUM 80 MG PO TABS
ORAL_TABLET | ORAL | 3 refills | Status: DC
Start: 2023-04-25 — End: 2023-09-11

## 2023-04-25 NOTE — Progress Notes (Signed)
Subjective:  Patient ID: Johnny Foster, male    DOB: [birth year]  Age: 66 y.o. MRN: [id number]  CC: Medical Management of Chronic Issues   HPI Johnny Foster presents for  presents for  follow-up of hypertension. Patient has no history of headache chest pain or shortness of breath or recent cough. Patient also denies symptoms of TIA such as focal numbness or weakness. Patient denies side effects from medication. States taking it regularly.Taking metoprolol due to ASCVD. Currently angina free. Active. Exercising trying to eat right. Keeping weight under control. Concerned he put on a few pounds. Has appt. Soon with cardiology, Dr. Antoine Poche.    in for follow-up of elevated cholesterol. Doing well without complaints on current medication. Denies side effects of statin including myalgia and arthralgia and nausea. Currently no chest pain, shortness of breath or other cardiovascular related symptoms noted.  Due PSA screening.       04/25/2023    8:27 AM 10/28/2022    1:56 PM 02/02/2022    9:37 AM  Depression screen PHQ 2/9  Decreased Interest 0 0 0  Down, Depressed, Hopeless 0 0 0  PHQ - 2 Score 0 0 0    History Johnny Foster has a past medical history of Hepatitis C, Hyperlipidemia, Hypertension, Iron deficiency anemia (10/02/2018), Myocardial infarction [facility]), and Substance abuse (HCC).   He has a past surgical history that includes Tonsilectomy, adenoidectomy, bilateral myringotomy and tubes (1969) and Cardiac valuve replacement.   His family history includes Arthritis in his mother; CAD (age of onset: 17) in his mother; Cancer in his brother; Lung disease in his father.He reports that he has been smoking cigarettes. He has a 10.00 pack-year smoking history. He has never used smokeless tobacco. He reports current alcohol use. He reports that he does not currently use drugs after having used the following drugs: Other-see comments.    ROS Review of Systems  Constitutional: Negative.   Negative for fever.  HENT: Negative.    Eyes:  Negative for visual disturbance.  Respiratory:  Negative for cough and shortness of breath.   Cardiovascular:  Negative for chest pain and leg swelling.  Gastrointestinal:  Negative for abdominal pain, diarrhea, nausea and vomiting.  Genitourinary:  Negative for difficulty urinating.  Musculoskeletal:  Negative for arthralgias and myalgias.  Skin:  Negative for rash.  Neurological:  Negative for headaches.  Psychiatric/Behavioral:  Negative for sleep disturbance.     Objective:  BP 135/68   Pulse (!) 56   Temp 97.9 F (36.6 C)   Ht 5\' 10"  (1.778 m)   Wt 193 lb 6.4 oz (87.7 kg)   SpO2 97%   BMI 27.75 kg/m   BP Readings from Last 3 Encounters:  04/25/23 135/68  08/31/22 (!) 142/78  04/19/22 132/82    Wt Readings from Last 3 Encounters:  04/25/23 193 lb 6.4 oz (87.7 kg)  10/28/22 194 lb (88 kg)  08/31/22 191 lb (86.6 kg)     Physical Exam Constitutional:      General: He is not in acute distress.    Appearance: He is well-developed.  HENT:     Head: Normocephalic and atraumatic.     Right Ear: External ear normal.     Left Ear: External ear normal.     Nose: Nose normal.  Eyes:     Conjunctiva/sclera: Conjunctivae normal.     Pupils: Pupils are equal, round, and reactive to light.  Cardiovascular:     Rate and Rhythm: Normal rate and regular rhythm.  Heart sounds: Normal heart sounds. No murmur heard. Pulmonary:     Effort: Pulmonary effort is normal. No respiratory distress.     Breath sounds: Normal breath sounds. No wheezing or rales.  Abdominal:     Palpations: Abdomen is soft.     Tenderness: There is no abdominal tenderness.  Musculoskeletal:        General: Normal range of motion.     Cervical back: Normal range of motion and neck supple.  Skin:    General: Skin is warm and dry.  Neurological:     Mental Status: He is alert and oriented to person, place, and time.     Deep Tendon Reflexes: Reflexes  are normal and symmetric.  Psychiatric:        Behavior: Behavior normal.        Thought Content: Thought content normal.        Judgment: Judgment normal.       Assessment & Plan:   Johnny Foster was seen today for medical management of chronic issues.  Diagnoses and all orders for this visit:  Mixed hyperlipidemia -     Lipid panel  Essential hypertension -     CBC with Differential/Platelet -     CMP14+EGFR  Prostate cancer screening -     PSA, total and free  ASCVD (arteriosclerotic cardiovascular disease)  Other orders -     atorvastatin (LIPITOR) 80 MG tablet; TAKE 1 TABLET(80 MG) BY MOUTH DAILY -     lisinopril (ZESTRIL) 5 MG tablet; TAKE 1 TABLET(5 MG) BY MOUTH DAILY -     metoprolol tartrate (LOPRESSOR) 25 MG tablet; TAKE 1 TABLET(25 MG) BY MOUTH TWICE DAILY       I am having Johnny Foster maintain his nitroGLYCERIN, Aspirin Low Dose, atorvastatin, lisinopril, and metoprolol tartrate.  Allergies as of 04/25/2023   No Known Allergies      Medication List        Accurate as of April 25, 2023  9:07 AM. If you have any questions, ask your nurse or doctor.          Aspirin Low Dose 81 MG tablet Generic drug: aspirin EC TAKE 1 TABLET(81 MG) BY MOUTH DAILY   atorvastatin 80 MG tablet Commonly known as: LIPITOR TAKE 1 TABLET(80 MG) BY MOUTH DAILY   lisinopril 5 MG tablet Commonly known as: ZESTRIL TAKE 1 TABLET(5 MG) BY MOUTH DAILY   metoprolol tartrate 25 MG tablet Commonly known as: LOPRESSOR TAKE 1 TABLET(25 MG) BY MOUTH TWICE DAILY   nitroGLYCERIN 0.4 MG SL tablet Commonly known as: NITROSTAT ONE TABLET UNDER TONGUE AS NEEDED FOR CHEST PAIN         Follow-up: Return in about 1 year (around 04/24/2024).  Mechele Claude, M.D.

## 2023-04-26 LAB — CMP14+EGFR
ALT: 18 IU/L (ref 0–44)
AST: 19 IU/L (ref 0–40)
Albumin/Globulin Ratio: 2.4 — ABNORMAL HIGH (ref 1.2–2.2)
Albumin: 4.7 g/dL (ref 3.9–4.9)
Alkaline Phosphatase: 80 IU/L (ref 44–121)
BUN/Creatinine Ratio: 14 (ref 10–24)
BUN: 12 mg/dL (ref 8–27)
Bilirubin Total: 0.3 mg/dL (ref 0.0–1.2)
CO2: 24 mmol/L (ref 20–29)
Calcium: 9.9 mg/dL (ref 8.6–10.2)
Chloride: 107 mmol/L — ABNORMAL HIGH (ref 96–106)
Creatinine, Ser: 0.88 mg/dL (ref 0.76–1.27)
Globulin, Total: 2 g/dL (ref 1.5–4.5)
Glucose: 85 mg/dL (ref 70–99)
Potassium: 5.2 mmol/L (ref 3.5–5.2)
Sodium: 143 mmol/L (ref 134–144)
Total Protein: 6.7 g/dL (ref 6.0–8.5)
eGFR: 95 mL/min/{1.73_m2} (ref >59)

## 2023-04-26 LAB — CBC WITH DIFFERENTIAL/PLATELET
Basophils Absolute: 0.1 10*3/uL (ref 0.0–0.2)
Basos: 1 %
EOS (ABSOLUTE): 0.6 10*3/uL — ABNORMAL HIGH (ref 0.0–0.4)
Eos: 7 %
Hematocrit: 50.4 % (ref 37.5–51.0)
Hemoglobin: 16.2 g/dL (ref 13.0–17.7)
Immature Grans (Abs): 0 10*3/uL (ref 0.0–0.1)
Immature Granulocytes: 0 %
Lymphocytes Absolute: 2 10*3/uL (ref 0.7–3.1)
Lymphs: 22 %
MCH: 31 pg (ref 26.6–33.0)
MCHC: 32.1 g/dL (ref 31.5–35.7)
MCV: 97 fL (ref 79–97)
Monocytes Absolute: 0.9 10*3/uL (ref 0.1–0.9)
Monocytes: 10 %
Neutrophils Absolute: 5.4 10*3/uL (ref 1.4–7.0)
Neutrophils: 60 %
Platelets: 256 10*3/uL (ref 150–450)
RBC: 5.22 x10E6/uL (ref 4.14–5.80)
RDW: 13.3 % (ref 11.6–15.4)
WBC: 9 10*3/uL (ref 3.4–10.8)

## 2023-04-26 LAB — PSA, TOTAL AND FREE
PSA, Free Pct: 28 %
PSA, Free: 0.14 ng/mL
Prostate Specific Ag, Serum: 0.5 ng/mL (ref 0.0–4.0)

## 2023-04-26 LAB — LIPID PANEL
Chol/HDL Ratio: 2 ratio (ref 0.0–5.0)
Cholesterol, Total: 140 mg/dL (ref 100–199)
HDL: 69 mg/dL (ref >39)
LDL Chol Calc (NIH): 53 mg/dL (ref 0–99)
Triglycerides: 100 mg/dL (ref 0–149)
VLDL Cholesterol Cal: 18 mg/dL (ref 5–40)

## 2023-04-26 NOTE — Progress Notes (Signed)
Hello Sasha,  Your lab result is normal and/or stable.Some minor variations that are not significant are commonly marked abnormal, but do not represent any medical problem for you.  Best regards, Alex Leahy, M.D.

## 2023-05-05 ENCOUNTER — Other Ambulatory Visit: Payer: Self-pay | Admitting: Family Medicine

## 2023-05-22 ENCOUNTER — Encounter (HOSPITAL_COMMUNITY): Payer: Self-pay | Admitting: Internal Medicine

## 2023-06-14 ENCOUNTER — Encounter (HOSPITAL_COMMUNITY): Payer: Self-pay | Admitting: Internal Medicine

## 2023-06-20 NOTE — Progress Notes (Unsigned)
  Cardiology Office Note:   Date:  06/21/2023  ID:  Johnny Foster, DOB [birth year], MRN [id number] PCP: Dettinger, Elige Radon, MD  [facility] Providers Cardiologist:  Rollene Rotunda, MD {  History of Present Illness:   Johnny Foster is a 67 y.o. male who presents for follow up of CAD  He had a cardiac cath in June 2019 in [place] after presenting with a acute inferolateral MI.  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  EF was normal.      Since I last saw him he has had no new cardiovascular problems.  The patient denies any new symptoms such as chest discomfort, neck or arm discomfort. There has been no new shortness of breath, PND or orthopnea. There have been no reported palpitations, presyncope or syncope.  He exercises routinely.  He does vigorous yard work.  ROS: ED.  Otherwise as stated in the HPI and negative for all other systems.  Studies Reviewed:    EKG:   EKG Interpretation Date/Time:  Wednesday June 21 2023 13:06:12 EDT Ventricular Rate:  65 PR Interval:  144 QRS Duration:  86 QT Interval:  404 QTC Calculation: 420 R Axis:   68  Text Interpretation: Normal sinus rhythm Normal ECG When compared with ECG of 22-Oct-2021 03:30, No significant change since last tracing Confirmed by Rollene Rotunda ([id number]) on 06/21/2023 1:34:54 PM    Risk Assessment/Calculations:              Physical Exam:   VS:  BP 113/67   Pulse 65   Ht 5\' 10"  (1.778 m)   Wt 193 lb 6.4 oz (87.7 kg)   SpO2 98%   BMI 27.75 kg/m    Wt Readings from Last 3 Encounters:  06/21/23 193 lb 6.4 oz (87.7 kg)  04/25/23 193 lb 6.4 oz (87.7 kg)  10/28/22 194 lb (88 kg)     GEN: Well nourished, well developed in no acute distress NECK: No JVD; No carotid bruits CARDIAC: RRR, no murmurs, rubs, gallops RESPIRATORY:  Clear to auscultation without rales, wheezing or rhonchi  ABDOMEN: Soft, non-tender, non-distended EXTREMITIES:   No edema; No deformity   ASSESSMENT AND PLAN:   CAD:  He can stop metoprolol to see if this helps with his ED. otherwise no change in therapy.   TOBACCO ABUSE : He is still smoking about 3 cigarettes a day and he understands need to quit completely.  DYSLIPIDEMIA:    LDL was 53 with an HDL of 69.  No change in therapy.    HTN: His blood pressure is at target.  No change in therapy.       Follow up me in 12 months.   Signed, Rollene Rotunda, MD

## 2023-06-21 ENCOUNTER — Encounter: Payer: Self-pay | Admitting: Cardiology

## 2023-06-21 ENCOUNTER — Encounter (HOSPITAL_COMMUNITY): Payer: Self-pay | Admitting: Internal Medicine

## 2023-06-21 ENCOUNTER — Ambulatory Visit (INDEPENDENT_AMBULATORY_CARE_PROVIDER_SITE_OTHER): Payer: Medicare Other | Admitting: Cardiology

## 2023-06-21 VITALS — BP 113/67 | HR 65 | Ht 70.0 in | Wt 193.4 lb

## 2023-06-21 DIAGNOSIS — I10 Essential (primary) hypertension: Secondary | ICD-10-CM | POA: Diagnosis not present

## 2023-06-21 DIAGNOSIS — E785 Hyperlipidemia, unspecified: Secondary | ICD-10-CM | POA: Diagnosis not present

## 2023-06-21 DIAGNOSIS — Z720 Tobacco use: Secondary | ICD-10-CM | POA: Diagnosis not present

## 2023-06-21 DIAGNOSIS — I2510 Atherosclerotic heart disease of native coronary artery without angina pectoris: Secondary | ICD-10-CM | POA: Diagnosis not present

## 2023-06-21 NOTE — Patient Instructions (Signed)
Medication Instructions:  Please discontinue your Metoprolol. Continue all other medications as listed.  *If you need a refill on your cardiac medications before your next appointment, please call your pharmacy*  Follow-Up: At [facility], you and your health needs are our priority.  As part of our continuing mission to provide you with exceptional heart care, we have created designated Provider Care Teams.  These Care Teams include your primary Cardiologist (physician) and Advanced Practice Providers (APPs -  Physician Assistants and Nurse Practitioners) who all work together to provide you with the care you need, when you need it.  We recommend signing up for the patient portal called "MyChart".  Sign up information is provided on this After Visit Summary.  MyChart is used to connect with patients for Virtual Visits (Telemedicine).  Patients are able to view lab/test results, encounter notes, upcoming appointments, etc.  Non-urgent messages can be sent to your provider as well.   To learn more about what you can do with MyChart, go to [email or web address].    Your next appointment:   1 year(s)  Provider:   Rollene Rotunda, MD

## 2023-07-20 ENCOUNTER — Encounter: Payer: Self-pay | Admitting: Family Medicine

## 2023-07-20 ENCOUNTER — Ambulatory Visit (INDEPENDENT_AMBULATORY_CARE_PROVIDER_SITE_OTHER): Payer: Medicare Other | Admitting: Family Medicine

## 2023-07-20 VITALS — BP 156/97 | HR 89 | Temp 98.2°F | Ht 70.0 in | Wt 193.4 lb

## 2023-07-20 DIAGNOSIS — R051 Acute cough: Secondary | ICD-10-CM | POA: Diagnosis not present

## 2023-07-20 DIAGNOSIS — J069 Acute upper respiratory infection, unspecified: Secondary | ICD-10-CM

## 2023-07-20 MED ORDER — METHYLPREDNISOLONE ACETATE 40 MG/ML IJ SUSP
40.0000 mg | Freq: Once | INTRAMUSCULAR | Status: AC
  Administered 2023-07-20: 60 mg via INTRAMUSCULAR

## 2023-07-20 NOTE — Progress Notes (Addendum)
Subjective:  Patient ID: Johnny Foster, male    DOB: June 03, 1956, 67 y.o.   MRN: [id number]  Patient Care Team: Dettinger, Elige Radon, MD as PCP - General (Family Medicine) Rollene Rotunda, MD as PCP - Cardiology (Cardiology)   Chief Complaint:  Nasal Congestion, Cough, Chills, and Headache (X 1 day )   HPI: Johnny Foster is a 67 y.o. male presenting on 07/20/2023 for Nasal Congestion, Cough, Chills, and Headache (X 1 day )   Cough This is a new problem. The current episode started yesterday. The problem has been unchanged. The cough is Non-productive. Associated symptoms include ear congestion, headaches, nasal congestion, postnasal drip and rhinorrhea. Pertinent negatives include no chest pain, chills, ear pain, fever, hemoptysis, rash, sore throat, shortness of breath or wheezing. Nothing aggravates the symptoms. Risk factors for lung disease include travel. He has tried OTC cough suppressant for the symptoms. The treatment provided significant relief.  URI  This is a new problem. The current episode started yesterday. There has been no fever. Associated symptoms include congestion, coughing, headaches, a plugged ear sensation and rhinorrhea. Pertinent negatives include no abdominal pain, chest pain, diarrhea, dysuria, ear pain, joint pain, joint swelling, nausea, neck pain, rash, sinus pain, sneezing, sore throat, swollen glands, vomiting or wheezing. He has tried decongestant and acetaminophen for the symptoms. The treatment provided significant relief.     Relevant past medical, surgical, family, and social history reviewed and updated as indicated.  Allergies and medications reviewed and updated. Data reviewed: Chart in Epic.   Past Medical History:  Diagnosis Date   Hepatitis C    Treated   Hyperlipidemia    Hypertension    Iron deficiency anemia 10/02/2018   Myocardial infarction [facility])    May 2019.  LAD moderate 60 to 80% stenosis.  Circumflex OM occluded.  RCA 80%  stenosis.  OM was thought to be a acute infarct vessel.  It was treated with a Synergy stent.  This is a 2.5 x 20 mm, RCA was treated with a 3.  3.0 by 16 mm Synergy.  Elective PCI in June of an LAD lesion with 3.0 x 38 mm Synergy.   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    Past Surgical History:  Procedure Laterality Date   CARDIAC VALVE SURGERY     TONSILECTOMY, ADENOIDECTOMY, BILATERAL MYRINGOTOMY AND TUBES  1969    Social History   Socioeconomic History   Marital status: Married    Spouse name: Not on file   Number of children: 2   Years of education: Not on file   Highest education level: Not on file  Occupational History   Occupation: retired  Tobacco Use   Smoking status: Every Day    Current packs/day: 0.25    Average packs/day: 0.3 packs/day for 40.0 years (10.0 ttl pk-yrs)    Types: Cigarettes   Smokeless tobacco: Never  Vaping Use   Vaping status: Never Used  Substance and Sexual Activity   Alcohol use: Yes    Comment: rarely   Drug use: Not Currently    Types: Other-see comments    Comment: history of Heroin and Cocaine use in his teens    Sexual activity: Yes  Other Topics Concern   Not on file  Social History Narrative   2 daughters and 5 grandchildren   Social Determinants of Health   Financial Resource Strain: Low Risk  (10/28/2022)   Overall Financial Resource Strain (CARDIA)  Difficulty of Paying Living Expenses: Not hard at all  Food Insecurity: No Food Insecurity (10/28/2022)   Hunger Vital Sign    Worried About Running Out of Food in the Last Year: Never true    Ran Out of Food in the Last Year: Never true  Transportation Needs: No Transportation Needs (10/28/2022)   PRAPARE - Administrator, Civil Service (Medical): No    Lack of Transportation (Non-Medical): No  Physical Activity: Sufficiently Active (10/28/2022)   Exercise Vital Sign    Days of Exercise per Week: 5 days    Minutes of Exercise per  Session: 30 min  Stress: No Stress Concern Present (10/28/2022)   [employer] of Occupational Health - Occupational Stress Questionnaire    Feeling of Stress : Not at all  Social Connections: Socially Integrated (10/28/2022)   Social Connection and Isolation Panel [NHANES]    Frequency of Communication with Friends and Family: More than three times a week    Frequency of Social Gatherings with Friends and Family: More than three times a week    Attends Religious Services: More than 4 times per year    Active Member of [employer] or Organizations: Yes    Attends Banker Meetings: More than 4 times per year    Marital Status: Married  Catering manager Violence: Not At Risk (10/28/2022)   Humiliation, Afraid, Rape, and Kick questionnaire    Fear of Current or Ex-Partner: No    Emotionally Abused: No    Physically Abused: No    Sexually Abused: No    Outpatient Encounter Medications as of 07/20/2023  Medication Sig   ASPIRIN LOW DOSE 81 MG tablet TAKE 1 TABLET(81 MG) BY MOUTH DAILY   atorvastatin (LIPITOR) 80 MG tablet TAKE 1 TABLET(80 MG) BY MOUTH DAILY   lisinopril (ZESTRIL) 5 MG tablet TAKE 1 TABLET(5 MG) BY MOUTH DAILY   nitroGLYCERIN (NITROSTAT) 0.4 MG SL tablet ONE TABLET UNDER TONGUE AS NEEDED FOR CHEST PAIN   [EXPIRED] methylPREDNISolone acetate (DEPO-MEDROL) injection 40 mg    No facility-administered encounter medications on file as of 07/20/2023.    No Known Allergies  Review of Systems  Constitutional:  Negative for activity change, appetite change, chills, diaphoresis, fatigue, fever and unexpected weight change.  HENT:  Positive for congestion, postnasal drip and rhinorrhea. Negative for dental problem, drooling, ear discharge, ear pain, facial swelling, hearing loss, mouth sores, nosebleeds, sinus pressure, sinus pain, sneezing, sore throat, tinnitus, trouble swallowing and voice change.   Respiratory:  Positive for cough. Negative for apnea, hemoptysis,  choking, chest tightness, shortness of breath, wheezing and stridor.   Cardiovascular:  Negative for chest pain.  Gastrointestinal:  Negative for abdominal pain, diarrhea, nausea and vomiting.  Genitourinary:  Negative for decreased urine volume, difficulty urinating and dysuria.  Musculoskeletal:  Negative for joint pain and neck pain.  Skin:  Negative for rash.  Neurological:  Positive for headaches. Negative for dizziness, tremors, seizures, syncope, facial asymmetry, speech difficulty, weakness, light-headedness and numbness.  All other systems reviewed and are negative.       Objective:  BP (!) 156/97   Pulse 89   Temp 98.2 F (36.8 C) (Temporal)   Ht 5\' 10"  (1.778 m)   Wt 193 lb 6.4 oz (87.7 kg)   SpO2 96%   BMI 27.75 kg/m    Wt Readings from Last 3 Encounters:  07/20/23 193 lb 6.4 oz (87.7 kg)  06/21/23 193 lb 6.4 oz (87.7 kg)  04/25/23 193  lb 6.4 oz (87.7 kg)    Physical Exam Vitals and nursing note reviewed.  Constitutional:      General: He is not in acute distress.    Appearance: Normal appearance. He is well-developed and well-groomed. He is not ill-appearing, toxic-appearing or diaphoretic.  HENT:     Head: Normocephalic and atraumatic.     Jaw: There is normal jaw occlusion.     Right Ear: Hearing, ear canal and external ear normal. A middle ear effusion is present.     Left Ear: Hearing, ear canal and external ear normal. A middle ear effusion is present.     Nose: Congestion present.     Right Turbinates: Enlarged.     Left Turbinates: Enlarged.     Right Sinus: No maxillary sinus tenderness or frontal sinus tenderness.     Left Sinus: No maxillary sinus tenderness or frontal sinus tenderness.     Mouth/Throat:     Lips: Pink.     Mouth: Mucous membranes are moist.     Pharynx: Oropharynx is clear. Uvula midline. Posterior oropharyngeal erythema present. No pharyngeal swelling, oropharyngeal exudate, uvula swelling or postnasal drip.  Eyes:      General: Lids are normal.     Extraocular Movements: Extraocular movements intact.     Conjunctiva/sclera: Conjunctivae normal.     Pupils: Pupils are equal, round, and reactive to light.  Neck:     Thyroid: No thyroid mass, thyromegaly or thyroid tenderness.     Vascular: No carotid bruit or JVD.     Trachea: Trachea and phonation normal.  Cardiovascular:     Rate and Rhythm: Normal rate and regular rhythm.     Chest Wall: PMI is not displaced.     Pulses: Normal pulses.     Heart sounds: Normal heart sounds. No murmur heard.    No friction rub. No gallop.  Pulmonary:     Effort: Pulmonary effort is normal. No respiratory distress.     Breath sounds: Normal breath sounds. No wheezing.  Abdominal:     General: Bowel sounds are normal. There is no distension or abdominal bruit.     Palpations: Abdomen is soft. There is no hepatomegaly or splenomegaly.     Tenderness: There is no abdominal tenderness. There is no right CVA tenderness or left CVA tenderness.     Hernia: No hernia is present.  Musculoskeletal:        General: Normal range of motion.     Cervical back: Normal range of motion and neck supple.     Right lower leg: No edema.     Left lower leg: No edema.  Lymphadenopathy:     Cervical: No cervical adenopathy.  Skin:    General: Skin is warm and dry.     Capillary Refill: Capillary refill takes less than 2 seconds.     Coloration: Skin is not cyanotic, jaundiced or pale.     Findings: No rash.  Neurological:     General: No focal deficit present.     Mental Status: He is alert and oriented to person, place, and time.     Sensory: Sensation is intact.     Motor: Motor function is intact.     Coordination: Coordination is intact.     Gait: Gait is intact.     Deep Tendon Reflexes: Reflexes are normal and symmetric.  Psychiatric:        Attention and Perception: Attention and perception normal.  Mood and Affect: Mood and affect normal.        Speech: Speech  normal.        Behavior: Behavior normal. Behavior is cooperative.        Thought Content: Thought content normal.        Cognition and Memory: Cognition and memory normal.        Judgment: Judgment normal.     Results for orders placed or performed in visit on 04/25/23  CBC with Differential/Platelet  Result Value Ref Range   WBC 9.0 3.4 - 10.8 x10E3/uL   RBC 5.22 4.14 - 5.80 x10E6/uL   Hemoglobin 16.2 13.0 - 17.7 g/dL   Hematocrit 32.4 40.1 - 51.0 %   MCV 97 79 - 97 fL   MCH 31.0 26.6 - 33.0 pg   MCHC 32.1 31.5 - 35.7 g/dL   RDW 02.7 25.3 - 66.4 %   Platelets 256 150 - 450 x10E3/uL   Neutrophils 60 Not Estab. %   Lymphs 22 Not Estab. %   Monocytes 10 Not Estab. %   Eos 7 Not Estab. %   Basos 1 Not Estab. %   Neutrophils Absolute 5.4 1.4 - 7.0 x10E3/uL   Lymphocytes Absolute 2.0 0.7 - 3.1 x10E3/uL   Monocytes Absolute 0.9 0.1 - 0.9 x10E3/uL   EOS (ABSOLUTE) 0.6 (H) 0.0 - 0.4 x10E3/uL   Basophils Absolute 0.1 0.0 - 0.2 x10E3/uL   Immature Granulocytes 0 Not Estab. %   Immature Grans (Abs) 0.0 0.0 - 0.1 x10E3/uL  CMP14+EGFR  Result Value Ref Range   Glucose 85 70 - 99 mg/dL   BUN 12 8 - 27 mg/dL   Creatinine, Ser 4.03 0.76 - 1.27 mg/dL   eGFR 95 >47 QQ/VZD/6.38   BUN/Creatinine Ratio 14 10 - 24   Sodium 143 134 - 144 mmol/L   Potassium 5.2 3.5 - 5.2 mmol/L   Chloride 107 (H) 96 - 106 mmol/L   CO2 24 20 - 29 mmol/L   Calcium 9.9 8.6 - 10.2 mg/dL   Total Protein 6.7 6.0 - 8.5 g/dL   Albumin 4.7 3.9 - 4.9 g/dL   Globulin, Total 2.0 1.5 - 4.5 g/dL   Albumin/Globulin Ratio 2.4 (H) 1.2 - 2.2   Bilirubin Total 0.3 0.0 - 1.2 mg/dL   Alkaline Phosphatase 80 44 - 121 IU/L   AST 19 0 - 40 IU/L   ALT 18 0 - 44 IU/L  Lipid panel  Result Value Ref Range   Cholesterol, Total 140 100 - 199 mg/dL   Triglycerides 756 0 - 149 mg/dL   HDL 69 >43 mg/dL   VLDL Cholesterol Cal 18 5 - 40 mg/dL   LDL Chol Calc (NIH) 53 0 - 99 mg/dL   Chol/HDL Ratio 2.0 0.0 - 5.0 ratio  PSA, total  and free  Result Value Ref Range   Prostate Specific Ag, Serum 0.5 0.0 - 4.0 ng/mL   PSA, Free 0.14 N/A ng/mL   PSA, Free Pct 28.0 %       Pertinent labs & imaging results that were available during my care of the patient were reviewed by me and considered in my medical decision making.  Assessment & Plan:  Johnny Foster was seen today for nasal congestion, cough, chills and headache.  Diagnoses and all orders for this visit:  Acute cough URI with cough and congestion No indications of acute bacterial illness. Viral testing pending. Will start antivirals if warranted. Symptomatic care discussed in detail. Depo-medrol in office today.  Report new, worsening, or persistent symptoms.  -     COVID-19, Flu A+B and RSV -     methylPREDNISolone acetate (DEPO-MEDROL) injection 40 mg     Continue all other maintenance medications.  Follow up plan: Return if symptoms worsen or fail to improve.   Continue healthy lifestyle choices, including diet (rich in fruits, vegetables, and lean proteins, and low in salt and simple carbohydrates) and exercise (at least 30 minutes of moderate physical activity daily).  Educational handout given for URI  The above assessment and management plan was discussed with the patient. The patient verbalized understanding of and has agreed to the management plan. Patient is aware to call the clinic if they develop any new symptoms or if symptoms persist or worsen. Patient is aware when to return to the clinic for a follow-up visit. Patient educated on when it is appropriate to go to the emergency department.   Kari Baars, FNP-C [facility] [phone]

## 2023-07-21 ENCOUNTER — Telehealth: Payer: Self-pay | Admitting: Family Medicine

## 2023-07-21 NOTE — Telephone Encounter (Signed)
Will contact once labs are back

## 2023-07-21 NOTE — Telephone Encounter (Signed)
Attempted to contact - NA  When patient calls back let him know we will contact him once we have received labs and they are reviewed by the provider.

## 2023-07-22 LAB — COVID-19, FLU A+B AND RSV
Influenza A, NAA: NOT DETECTED
Influenza B, NAA: NOT DETECTED
RSV, NAA: NOT DETECTED
SARS-CoV-2, NAA: DETECTED — AB

## 2023-09-11 ENCOUNTER — Encounter: Payer: Self-pay | Admitting: Family Medicine

## 2023-09-11 ENCOUNTER — Ambulatory Visit (INDEPENDENT_AMBULATORY_CARE_PROVIDER_SITE_OTHER): Payer: Medicare Other | Admitting: Family Medicine

## 2023-09-11 VITALS — BP 134/76 | HR 77 | Ht 70.0 in | Wt 196.0 lb

## 2023-09-11 DIAGNOSIS — I2510 Atherosclerotic heart disease of native coronary artery without angina pectoris: Secondary | ICD-10-CM

## 2023-09-11 DIAGNOSIS — I10 Essential (primary) hypertension: Secondary | ICD-10-CM

## 2023-09-11 DIAGNOSIS — Z716 Tobacco abuse counseling: Secondary | ICD-10-CM

## 2023-09-11 DIAGNOSIS — H539 Unspecified visual disturbance: Secondary | ICD-10-CM | POA: Diagnosis not present

## 2023-09-11 DIAGNOSIS — E782 Mixed hyperlipidemia: Secondary | ICD-10-CM

## 2023-09-11 DIAGNOSIS — Z125 Encounter for screening for malignant neoplasm of prostate: Secondary | ICD-10-CM

## 2023-09-11 MED ORDER — ATORVASTATIN CALCIUM 80 MG PO TABS: 80.0000 mg | ORAL_TABLET | Freq: Every day | ORAL | 3 refills | Status: DC

## 2023-09-11 MED ORDER — LISINOPRIL 5 MG PO TABS: 5.0000 mg | ORAL_TABLET | Freq: Every day | ORAL | 3 refills | Status: DC

## 2023-09-11 NOTE — Progress Notes (Signed)
BP 134/76   Pulse 77   Ht 5\' 10"  (1.778 m)   Wt 196 lb (88.9 kg)   SpO2 97%   BMI 28.12 kg/m    Subjective:   Patient ID: Johnny Foster, male    DOB: [birth year], 67 y.o.   MRN: [id number]  HPI: Johnny Foster is a 67 y.o. male presenting on 09/11/2023 for Establish Care   HPI Hypertension Patient is currently on lisinopril, and their blood pressure today is 134/76. Patient denies any lightheadedness or dizziness. Patient denies headaches, blurred vision, chest pains, shortness of breath, or weakness. Denies any side effects from medication and is content with current medication.   Hyperlipidemia Patient is coming in for recheck of his hyperlipidemia. The patient is currently taking Lipitor. They deny any issues with myalgias or history of liver damage from it. They deny any focal numbness or weakness or chest pain.   Patient has been having some vision changes recently where things are little bit harder to see especially at night.  He has not seen an eye doctor in many years.  He says he normally uses readers but the readers have not been sufficient for him recently this been going on for the past 6 months.  Relevant past medical, surgical, family and social history reviewed and updated as indicated. Interim medical history since our last visit reviewed. Allergies and medications reviewed and updated.  Review of Systems  Constitutional:  Negative for chills and fever.  Eyes:  Positive for visual disturbance.  Respiratory:  Negative for shortness of breath and wheezing.   Cardiovascular:  Negative for chest pain and leg swelling.  Musculoskeletal:  Positive for arthralgias. Negative for back pain and gait problem.  Skin:  Negative for rash.  Neurological:  Negative for dizziness and light-headedness.  All other systems reviewed and are negative.   Per HPI unless specifically indicated above   Allergies as of 09/11/2023   No Known Allergies      Medication List         Accurate as of September 11, 2023 10:36 AM. If you have any questions, ask your nurse or doctor.          Aspirin Low Dose 81 MG tablet Generic drug: aspirin EC TAKE 1 TABLET(81 MG) BY MOUTH DAILY   atorvastatin 80 MG tablet Commonly known as: LIPITOR Take 1 tablet (80 mg total) by mouth at bedtime. What changed:  how much to take how to take this when to take this additional instructions Changed by: Elige Radon Brittnay Pigman   lisinopril 5 MG tablet Commonly known as: ZESTRIL Take 1 tablet (5 mg total) by mouth daily. What changed:  how much to take how to take this when to take this additional instructions Changed by: Elige Radon Quantel Mcinturff   nitroGLYCERIN 0.4 MG SL tablet Commonly known as: NITROSTAT ONE TABLET UNDER TONGUE AS NEEDED FOR CHEST PAIN         Objective:   BP 134/76   Pulse 77   Ht 5\' 10"  (1.778 m)   Wt 196 lb (88.9 kg)   SpO2 97%   BMI 28.12 kg/m   Wt Readings from Last 3 Encounters:  09/11/23 196 lb (88.9 kg)  07/20/23 193 lb 6.4 oz (87.7 kg)  06/21/23 193 lb 6.4 oz (87.7 kg)    Physical Exam Vitals and nursing note reviewed.  Constitutional:      General: He is not in acute distress.    Appearance: He is well-developed. He is not  diaphoretic.  Eyes:     General: No scleral icterus.    Conjunctiva/sclera: Conjunctivae normal.  Neck:     Thyroid: No thyromegaly.  Cardiovascular:     Rate and Rhythm: Normal rate and regular rhythm.     Heart sounds: Normal heart sounds. No murmur heard. Pulmonary:     Effort: Pulmonary effort is normal. No respiratory distress.     Breath sounds: Normal breath sounds. No wheezing.  Musculoskeletal:        General: No swelling. Normal range of motion.     Cervical back: Neck supple.  Lymphadenopathy:     Cervical: No cervical adenopathy.  Skin:    General: Skin is warm and dry.     Findings: No rash.  Neurological:     Mental Status: He is alert and oriented to person, place, and time.      Coordination: Coordination normal.  Psychiatric:        Behavior: Behavior normal.       Assessment & Plan:   Problem List Items Addressed This Visit       Cardiovascular and Mediastinum   Essential hypertension - Primary   Relevant Medications   atorvastatin (LIPITOR) 80 MG tablet   lisinopril (ZESTRIL) 5 MG tablet   Other Relevant Orders   CBC with Differential/Platelet   CMP14+EGFR   ASCVD (arteriosclerotic cardiovascular disease)   Relevant Medications   atorvastatin (LIPITOR) 80 MG tablet   lisinopril (ZESTRIL) 5 MG tablet     Other   Mixed hyperlipidemia   Relevant Medications   atorvastatin (LIPITOR) 80 MG tablet   lisinopril (ZESTRIL) 5 MG tablet   Other Relevant Orders   CBC with Differential/Platelet   CMP14+EGFR   Lipid panel   Other Visit Diagnoses     Vision changes       Relevant Orders   Ambulatory referral to Ophthalmology   Encounter for smoking cessation counseling       Prostate cancer screening       Relevant Orders   PSA, total and free       Discussed smoking cessation, he says he smokes about 1/2/day and he does not want to try any medicines and wants to try quitting on his own at this point.  Will do blood work today.  He also sees cardiology to manage his CAD. Follow up plan: Return in about 6 months (around 03/11/2024), or if symptoms worsen or fail to improve, for Physical exam.  Counseling provided for all of the vaccine components Orders Placed This Encounter  Procedures   CBC with Differential/Platelet   CMP14+EGFR   Lipid panel   PSA, total and free   Ambulatory referral to Ophthalmology    Arville Care, MD [facility] 09/11/2023, 10:36 AM

## 2023-09-12 LAB — CMP14+EGFR
ALT: 21 IU/L (ref 0–44)
AST: 19 IU/L (ref 0–40)
Albumin: 4.4 g/dL (ref 3.9–4.9)
Alkaline Phosphatase: 78 IU/L (ref 44–121)
BUN/Creatinine Ratio: 15 (ref 10–24)
BUN: 14 mg/dL (ref 8–27)
Bilirubin Total: 0.5 mg/dL (ref 0.0–1.2)
CO2: 22 mmol/L (ref 20–29)
Calcium: 9.9 mg/dL (ref 8.6–10.2)
Chloride: 104 mmol/L (ref 96–106)
Creatinine, Ser: 0.95 mg/dL (ref 0.76–1.27)
Globulin, Total: 2.2 g/dL (ref 1.5–4.5)
Glucose: 112 mg/dL — ABNORMAL HIGH (ref 70–99)
Potassium: 4.5 mmol/L (ref 3.5–5.2)
Sodium: 141 mmol/L (ref 134–144)
Total Protein: 6.6 g/dL (ref 6.0–8.5)
eGFR: 89 mL/min/{1.73_m2} (ref >59)

## 2023-09-12 LAB — PSA, TOTAL AND FREE
PSA, Free Pct: 28.3 %
PSA, Free: 0.17 ng/mL
Prostate Specific Ag, Serum: 0.6 ng/mL (ref 0.0–4.0)

## 2023-09-12 LAB — CBC WITH DIFFERENTIAL/PLATELET
Basophils Absolute: 0.1 10*3/uL (ref 0.0–0.2)
Basos: 1 %
EOS (ABSOLUTE): 0.4 10*3/uL (ref 0.0–0.4)
Eos: 4 %
Hematocrit: 47.4 % (ref 37.5–51.0)
Hemoglobin: 15.5 g/dL (ref 13.0–17.7)
Immature Grans (Abs): 0 10*3/uL (ref 0.0–0.1)
Immature Granulocytes: 0 %
Lymphocytes Absolute: 1.7 10*3/uL (ref 0.7–3.1)
Lymphs: 20 %
MCH: 32.5 pg (ref 26.6–33.0)
MCHC: 32.7 g/dL (ref 31.5–35.7)
MCV: 99 fL — ABNORMAL HIGH (ref 79–97)
Monocytes Absolute: 0.6 10*3/uL (ref 0.1–0.9)
Monocytes: 8 %
Neutrophils Absolute: 5.5 10*3/uL (ref 1.4–7.0)
Neutrophils: 67 %
Platelets: 233 10*3/uL (ref 150–450)
RBC: 4.77 x10E6/uL (ref 4.14–5.80)
RDW: 12.8 % (ref 11.6–15.4)
WBC: 8.2 10*3/uL (ref 3.4–10.8)

## 2023-09-12 LAB — LIPID PANEL
Chol/HDL Ratio: 2.1 ratio (ref 0.0–5.0)
Cholesterol, Total: 154 mg/dL (ref 100–199)
HDL: 75 mg/dL (ref >39)
LDL Chol Calc (NIH): 58 mg/dL (ref 0–99)
Triglycerides: 121 mg/dL (ref 0–149)
VLDL Cholesterol Cal: 21 mg/dL (ref 5–40)

## 2023-12-21 ENCOUNTER — Ambulatory Visit: Payer: Medicare Other

## 2023-12-22 ENCOUNTER — Encounter: Payer: Self-pay | Admitting: Emergency Medicine

## 2023-12-28 ENCOUNTER — Ambulatory Visit: Payer: Medicare Other | Admitting: Family Medicine

## 2023-12-28 ENCOUNTER — Encounter: Payer: Self-pay | Admitting: Family Medicine

## 2023-12-28 VITALS — BP 128/78 | HR 77 | Ht 70.0 in | Wt 197.0 lb

## 2023-12-28 DIAGNOSIS — M654 Radial styloid tenosynovitis [de Quervain]: Secondary | ICD-10-CM | POA: Diagnosis not present

## 2023-12-28 MED ORDER — METHYLPREDNISOLONE ACETATE 40 MG/ML IJ SUSP
40.0000 mg | Freq: Once | INTRAMUSCULAR | Status: AC
  Administered 2023-12-28: 40 mg via INTRALESIONAL

## 2023-12-28 MED ORDER — PREDNISONE 20 MG PO TABS: ORAL_TABLET | ORAL | 0 refills | Status: DC

## 2023-12-28 NOTE — Progress Notes (Signed)
 BP 128/78   Pulse 77   Ht 5' 10 (1.778 m)   Wt 197 lb (89.4 kg)   SpO2 96%   BMI 28.27 kg/m    Subjective:   Patient ID: Johnny Foster, male    DOB: 05/24/1956, 68 y.o.   MRN: [id number]  HPI: Jairo Bellew is a 68 y.o. male presenting on 12/28/2023 for Wrist Pain (right)  Wrist pain Patient presents for a several month history of right wrist pain. He cannot recall exactly when the pain started, but he believes the injury may have occurred when working in his garage on his motorcycle. He also used to go bowling frequently, though it has been a few months since he last played. He is right handed, which has made it difficult to complete certain activities. He describes the pain as constant and sharp, reaching a 10/10 at its worst. The pain is located over the radial side of the wrist and worsens with wrist adduction. He has tried acetaminophen  for the pain without much relief.   Relevant past medical, surgical, family and social history reviewed and updated as indicated. Interim medical history since our last visit reviewed. Allergies and medications reviewed and updated.  Review of Systems  Constitutional:  Negative for chills and fever.  HENT:  Negative for congestion and sinus pressure.   Respiratory:  Negative for shortness of breath.   Cardiovascular:  Negative for chest pain.  Gastrointestinal:  Negative for abdominal pain.  Genitourinary:  Negative for dysuria.  Musculoskeletal:  Positive for arthralgias. Negative for myalgias.  Skin:  Negative for rash and wound.  Neurological:  Negative for syncope, weakness, light-headedness and headaches.    Per HPI unless specifically indicated above   Allergies as of 12/28/2023   No Known Allergies      Medication List        Accurate as of December 28, 2023  2:09 PM. If you have any questions, ask your nurse or doctor.          Aspirin  Low Dose 81 MG tablet Generic drug: aspirin  EC TAKE 1 TABLET(81 MG) BY MOUTH  DAILY   atorvastatin  80 MG tablet Commonly known as: LIPITOR Take 1 tablet (80 mg total) by mouth at bedtime.   lisinopril  5 MG tablet Commonly known as: ZESTRIL  Take 1 tablet (5 mg total) by mouth daily.   nitroGLYCERIN  0.4 MG SL tablet Commonly known as: NITROSTAT  ONE TABLET UNDER TONGUE AS NEEDED FOR CHEST PAIN   predniSONE  20 MG tablet Commonly known as: DELTASONE  2 po at same time daily for 5 days Started by: Fonda LABOR Joziyah Roblero         Objective:   BP 128/78   Pulse 77   Ht 5' 10 (1.778 m)   Wt 197 lb (89.4 kg)   SpO2 96%   BMI 28.27 kg/m   Wt Readings from Last 3 Encounters:  12/28/23 197 lb (89.4 kg)  09/11/23 196 lb (88.9 kg)  07/20/23 193 lb 6.4 oz (87.7 kg)    Physical Exam Vitals and nursing note reviewed.  Constitutional:      Appearance: Normal appearance. He is normal weight.  HENT:     Head: Normocephalic and atraumatic.  Eyes:     Conjunctiva/sclera: Conjunctivae normal.  Cardiovascular:     Rate and Rhythm: Normal rate and regular rhythm.     Heart sounds: Normal heart sounds.  Pulmonary:     Effort: Pulmonary effort is normal.     Breath sounds: Normal breath  sounds. No wheezing or rales.  Musculoskeletal:     Right wrist: Swelling (mild swelling near radial styloid) and tenderness (Point tenderness over radial styloid.) present. Decreased range of motion (Positive Finkelstein test.).     Left wrist: Normal.     Cervical back: Normal range of motion.     Right lower leg: No edema.     Left lower leg: No edema.  Skin:    General: Skin is warm and dry.  Neurological:     Mental Status: He is alert and oriented to person, place, and time.  Psychiatric:        Mood and Affect: Mood normal.        Behavior: Behavior normal.        Thought Content: Thought content normal.        Judgment: Judgment normal.     Assessment & Plan:   Problem List Items Addressed This Visit   None Visit Diagnoses       De Quervain's disease  (tenosynovitis)    -  Primary   Relevant Medications   predniSONE  (DELTASONE ) 20 MG tablet   methylPREDNISolone  acetate (DEPO-MEDROL ) injection 40 mg (Completed)       Symptoms are consistent with De Quervain's tenosynovitis. Since patient has tried acetaminophen  without much relief, will try steroid injection as well as a 5-day course of prednisone . Procedure note is below. Patient will notify us  if pain has not improved in the next week, at which point we can consider referral to orthopedics.  Wrist injection: Consent form signed. Risk factors of bleeding and infection discussed with patient and patient is agreeable towards injection. Patient prepped with Betadine. Injected 40 mg of Depo-Medrol  and 1 mL of 2% lidocaine. Patient tolerated procedure well and no side effects from noted. Minimal to no bleeding. Simple bandage applied after.   Follow up plan: Return if symptoms worsen or fail to improve.  Counseling provided for all of the vaccine components No orders of the defined types were placed in this encounter.   Vernell Music, Medical Student [facility] 12/28/2023, 2:09 PM  I was personally present for all components of the history, physical exam and/or medical decision making.  I agree with the documentation performed by the student and agree with assessment and plan above.  Fonda Levins, MD [facility] 12/29/2023, 3:02 PM

## 2024-01-02 ENCOUNTER — Other Ambulatory Visit: Payer: Self-pay | Admitting: Family Medicine

## 2024-01-02 DIAGNOSIS — M654 Radial styloid tenosynovitis [de Quervain]: Secondary | ICD-10-CM

## 2024-01-17 ENCOUNTER — Other Ambulatory Visit: Payer: Self-pay | Admitting: Emergency Medicine

## 2024-01-17 DIAGNOSIS — Z122 Encounter for screening for malignant neoplasm of respiratory organs: Secondary | ICD-10-CM

## 2024-01-17 DIAGNOSIS — Z87891 Personal history of nicotine dependence: Secondary | ICD-10-CM

## 2024-01-17 DIAGNOSIS — F17210 Nicotine dependence, cigarettes, uncomplicated: Secondary | ICD-10-CM

## 2024-01-24 ENCOUNTER — Encounter (HOSPITAL_COMMUNITY): Payer: Self-pay | Admitting: Internal Medicine

## 2024-01-25 ENCOUNTER — Ambulatory Visit (HOSPITAL_COMMUNITY)
Admission: RE | Admit: 2024-01-25 | Discharge: 2024-01-25 | Disposition: A | Discharge status: Home / Self Care | Payer: Medicare Other | Source: Ambulatory Visit | Attending: Acute Care | Admitting: Acute Care

## 2024-01-25 DIAGNOSIS — F17210 Nicotine dependence, cigarettes, uncomplicated: Secondary | ICD-10-CM | POA: Diagnosis present

## 2024-01-25 DIAGNOSIS — Z87891 Personal history of nicotine dependence: Secondary | ICD-10-CM | POA: Diagnosis present

## 2024-01-25 DIAGNOSIS — Z122 Encounter for screening for malignant neoplasm of respiratory organs: Secondary | ICD-10-CM | POA: Diagnosis present

## 2024-02-21 ENCOUNTER — Other Ambulatory Visit: Payer: Self-pay

## 2024-02-21 DIAGNOSIS — Z122 Encounter for screening for malignant neoplasm of respiratory organs: Secondary | ICD-10-CM

## 2024-02-21 DIAGNOSIS — Z87891 Personal history of nicotine dependence: Secondary | ICD-10-CM

## 2024-02-21 DIAGNOSIS — F17210 Nicotine dependence, cigarettes, uncomplicated: Secondary | ICD-10-CM

## 2024-05-07 ENCOUNTER — Other Ambulatory Visit: Payer: Self-pay | Admitting: Family Medicine

## 2024-05-07 DIAGNOSIS — I2510 Atherosclerotic heart disease of native coronary artery without angina pectoris: Secondary | ICD-10-CM

## 2024-06-11 ENCOUNTER — Emergency Department (HOSPITAL_COMMUNITY)

## 2024-06-11 ENCOUNTER — Emergency Department (HOSPITAL_COMMUNITY)
Admission: EM | Admit: 2024-06-11 | Discharge: 2024-06-11 | Disposition: A | Discharge status: Home / Self Care | Attending: Emergency Medicine | Admitting: Emergency Medicine

## 2024-06-11 ENCOUNTER — Encounter (HOSPITAL_COMMUNITY): Payer: Self-pay | Admitting: Emergency Medicine

## 2024-06-11 ENCOUNTER — Other Ambulatory Visit: Payer: Self-pay

## 2024-06-11 DIAGNOSIS — I10 Essential (primary) hypertension: Secondary | ICD-10-CM | POA: Insufficient documentation

## 2024-06-11 DIAGNOSIS — Z79899 Other long term (current) drug therapy: Secondary | ICD-10-CM | POA: Diagnosis not present

## 2024-06-11 DIAGNOSIS — I2510 Atherosclerotic heart disease of native coronary artery without angina pectoris: Secondary | ICD-10-CM | POA: Diagnosis not present

## 2024-06-11 DIAGNOSIS — R194 Change in bowel habit: Secondary | ICD-10-CM | POA: Insufficient documentation

## 2024-06-11 DIAGNOSIS — Z7982 Long term (current) use of aspirin: Secondary | ICD-10-CM | POA: Insufficient documentation

## 2024-06-11 LAB — CBC WITH DIFFERENTIAL/PLATELET
Abs Immature Granulocytes: 0.04 K/uL (ref 0.00–0.07)
Basophils Absolute: 0.1 K/uL (ref 0.0–0.1)
Basophils Relative: 1 %
Eosinophils Absolute: 0.2 K/uL (ref 0.0–0.5)
Eosinophils Relative: 2 %
HCT: 48.4 % (ref 39.0–52.0)
Hemoglobin: 16.4 g/dL (ref 13.0–17.0)
Immature Granulocytes: 0 %
Lymphocytes Relative: 10 %
Lymphs Abs: 1.1 K/uL (ref 0.7–4.0)
MCH: 32.8 pg (ref 26.0–34.0)
MCHC: 33.9 g/dL (ref 30.0–36.0)
MCV: 96.8 fL (ref 80.0–100.0)
Monocytes Absolute: 0.9 K/uL (ref 0.1–1.0)
Monocytes Relative: 9 %
Neutro Abs: 8.4 K/uL — ABNORMAL HIGH (ref 1.7–7.7)
Neutrophils Relative %: 78 %
Platelets: 204 K/uL (ref 150–400)
RBC: 5 MIL/uL (ref 4.22–5.81)
RDW: 13.7 % (ref 11.5–15.5)
WBC: 10.6 K/uL — ABNORMAL HIGH (ref 4.0–10.5)
nRBC: 0 % (ref 0.0–0.2)

## 2024-06-11 LAB — COMPREHENSIVE METABOLIC PANEL WITH GFR
ALT: 26 U/L (ref 0–44)
AST: 21 U/L (ref 15–41)
Albumin: 4 g/dL (ref 3.5–5.0)
Alkaline Phosphatase: 74 U/L (ref 38–126)
Anion gap: 13 (ref 5–15)
BUN: 9 mg/dL (ref 8–23)
CO2: 19 mmol/L — ABNORMAL LOW (ref 22–32)
Calcium: 9.3 mg/dL (ref 8.9–10.3)
Chloride: 107 mmol/L (ref 98–111)
Creatinine, Ser: 0.85 mg/dL (ref 0.61–1.24)
GFR, Estimated: >60 mL/min (ref >60)
Glucose, Bld: 99 mg/dL (ref 70–99)
Potassium: 3.9 mmol/L (ref 3.5–5.1)
Sodium: 139 mmol/L (ref 135–145)
Total Bilirubin: 0.6 mg/dL (ref 0.0–1.2)
Total Protein: 7.3 g/dL (ref 6.5–8.1)

## 2024-06-11 LAB — URINALYSIS, ROUTINE W REFLEX MICROSCOPIC
Bacteria, UA: NONE SEEN
Bilirubin Urine: NEGATIVE
Glucose, UA: NEGATIVE mg/dL
Ketones, ur: NEGATIVE mg/dL
Leukocytes,Ua: NEGATIVE
Nitrite: NEGATIVE
Protein, ur: 30 mg/dL — AB
Specific Gravity, Urine: 1.025 (ref 1.005–1.030)
pH: 5 (ref 5.0–8.0)

## 2024-06-11 LAB — LIPASE, BLOOD: Lipase: 45 U/L (ref 11–51)

## 2024-06-11 NOTE — ED Triage Notes (Signed)
 Pt endorses gas and abd distention for a week. Also reports more frequent BM and soft. Endorses lack of appetite. Generalized abd discomfort. Reports frequent tylenol  use.

## 2024-06-11 NOTE — ED Provider Notes (Signed)
 [facility] AT [facility] Provider Note   CSN: [id number] Arrival date & time: 06/11/24  9142     Patient presents with: GI Problem   Johnny Foster is a 67 y.o. male with a history of CAD with history of MI, hypertension, hyperlipidemia, history of hepatitis C presenting with an approximate 10-day history of bowel changes in association with abdominal distention.  He describes normally having very regular well-formed bowel movements, over the past 10 days he has had increasing frequency of smaller stools, but has now progressed with passage of dark brown watery stools.  He denies fevers or chills, nausea or vomiting.  He has had reduced appetite and endorses increased gas.  He denies history of GERD, he denies abdominal pain but is uncomfortable regarding the distention.  No recent changes in diet, no recent antibiotic use.  Has had no treatments prior to arrival.  No abdominal surgical history.  He does drink alcohol daily, denies history of liver or pancreas problems.  {Add pertinent medical, surgical, social history, OB history to YEP:67052} The history is provided by the patient.       Prior to Admission medications   Medication Sig Start Date End Date Taking? Authorizing Provider  aspirin  EC (ASPIRIN  LOW DOSE) 81 MG tablet TAKE 1 TABLET(81 MG) BY MOUTH DAILY 05/07/24   Dettinger, Fonda LABOR, MD  atorvastatin  (LIPITOR) 80 MG tablet Take 1 tablet (80 mg total) by mouth at bedtime. 09/11/23   Dettinger, Fonda LABOR, MD  lisinopril  (ZESTRIL ) 5 MG tablet Take 1 tablet (5 mg total) by mouth daily. 09/11/23   Dettinger, Fonda LABOR, MD  nitroGLYCERIN  (NITROSTAT ) 0.4 MG SL tablet ONE TABLET UNDER TONGUE AS NEEDED FOR CHEST PAIN 09/17/21   Zollie Lowers, MD  predniSONE  (DELTASONE ) 20 MG tablet 2 po at same time daily for 5 days 12/28/23   Dettinger, Fonda LABOR, MD    Allergies: Patient has no known allergies.    Review of Systems  Constitutional:  Positive for appetite  change. Negative for chills and fever.  HENT:  Negative for congestion.   Eyes: Negative.   Respiratory:  Negative for chest tightness and shortness of breath.   Cardiovascular:  Negative for chest pain.  Gastrointestinal:  Positive for abdominal distention and diarrhea. Negative for abdominal pain, nausea and vomiting.  Genitourinary: Negative.  Negative for dysuria.  Musculoskeletal:  Negative for arthralgias, joint swelling and neck pain.  Skin: Negative.  Negative for rash and wound.  Neurological:  Negative for dizziness, weakness, light-headedness, numbness and headaches.  Psychiatric/Behavioral: Negative.      Updated Vital Signs BP 136/72   Pulse 75   Temp 98 F (36.7 C) (Oral)   Resp 18   Ht 5' 10 (1.778 m)   Wt 89 kg   SpO2 97%   BMI 28.15 kg/m   Physical Exam Vitals and nursing note reviewed.  Constitutional:      Appearance: He is well-developed.  HENT:     Head: Normocephalic and atraumatic.  Eyes:     Conjunctiva/sclera: Conjunctivae normal.  Cardiovascular:     Rate and Rhythm: Normal rate and regular rhythm.     Heart sounds: Normal heart sounds.  Pulmonary:     Effort: Pulmonary effort is normal.     Breath sounds: Normal breath sounds. No wheezing.  Abdominal:     General: Bowel sounds are normal. There is distension.     Palpations: Abdomen is soft. There is no mass.  Tenderness: There is no abdominal tenderness. There is no guarding or rebound.     Comments: Soft distention throughout abdomen, no guarding or rebound.  Hyperactive bowel sounds are present.  Nontender.  Musculoskeletal:        General: Normal range of motion.     Cervical back: Normal range of motion.  Skin:    General: Skin is warm and dry.  Neurological:     Mental Status: He is alert.     (all labs ordered are listed, but only abnormal results are displayed) Labs Reviewed  CBC WITH DIFFERENTIAL/PLATELET - Abnormal; Notable for the following components:      Result  Value   WBC 10.6 (*)    Neutro Abs 8.4 (*)    All other components within normal limits  COMPREHENSIVE METABOLIC PANEL WITH GFR - Abnormal; Notable for the following components:   CO2 19 (*)    All other components within normal limits  URINALYSIS, ROUTINE W REFLEX MICROSCOPIC - Abnormal; Notable for the following components:   Color, Urine AMBER (*)    APPearance HAZY (*)    Hgb urine dipstick SMALL (*)    Protein, ur 30 (*)    All other components within normal limits  LIPASE, BLOOD    EKG: None  Radiology: DG Abd 2 Views Result Date: 06/11/2024 CLINICAL DATA:  Abdominal distension. EXAM: ABDOMEN - 2 VIEW COMPARISON:  None Available. FINDINGS: The bowel gas pattern is normal. There is no evidence of free air. No radio-opaque calculi or other significant radiographic abnormality is seen. IMPRESSION: Negative. Electronically Signed   By: Fonda Field M.D.   On: 06/11/2024 10:02    {Document cardiac monitor, telemetry assessment procedure when appropriate:32947} Procedures   Medications Ordered in the ED - No data to display    {Click here for ABCD2, HEART and other calculators REFRESH Note before signing:1}                              Medical Decision Making Amount and/or Complexity of Data Reviewed Labs: ordered. Radiology: ordered.     {Document critical care time when appropriate  Document review of labs and clinical decision tools ie CHADS2VASC2, etc  Document your independent review of radiology images and any outside records  Document your discussion with family members, caretakers and with consultants  Document social determinants of health affecting pt's care  Document your decision making why or why not admission, treatments were needed:32947:::1}   Final diagnoses:  Bowel habit changes    ED Discharge Orders     None

## 2024-06-11 NOTE — Discharge Instructions (Signed)
 Your lab tests and x-ray are reassuring today with no significant abnormal findings.  I recommend a trial of Maalox or Mylanta which can help with increased gas formation and may settle down these stool changes.  Also recommend follow-up care with a gastroenterologist and have referred you to Dr. Eartha.  Please call his office to arrange an office follow-up.

## 2024-08-29 ENCOUNTER — Ambulatory Visit

## 2024-08-29 ENCOUNTER — Encounter: Payer: Self-pay | Admitting: Family Medicine

## 2024-08-29 ENCOUNTER — Ambulatory Visit (INDEPENDENT_AMBULATORY_CARE_PROVIDER_SITE_OTHER): Admitting: Family Medicine

## 2024-08-29 VITALS — BP 131/69 | HR 68 | Ht 70.0 in | Wt 195.0 lb

## 2024-08-29 DIAGNOSIS — E782 Mixed hyperlipidemia: Secondary | ICD-10-CM | POA: Diagnosis not present

## 2024-08-29 DIAGNOSIS — J439 Emphysema, unspecified: Secondary | ICD-10-CM

## 2024-08-29 DIAGNOSIS — I10 Essential (primary) hypertension: Secondary | ICD-10-CM | POA: Diagnosis not present

## 2024-08-29 DIAGNOSIS — I2510 Atherosclerotic heart disease of native coronary artery without angina pectoris: Secondary | ICD-10-CM | POA: Diagnosis not present

## 2024-08-29 MED ORDER — ATORVASTATIN CALCIUM 80 MG PO TABS: 80.0000 mg | ORAL_TABLET | Freq: Every day | ORAL | 3 refills | Status: AC

## 2024-08-29 MED ORDER — LISINOPRIL 5 MG PO TABS: 5.0000 mg | ORAL_TABLET | Freq: Every day | ORAL | 3 refills | Status: AC

## 2024-08-29 MED ORDER — ASPIRIN 81 MG PO TBEC: 81.0000 mg | DELAYED_RELEASE_TABLET | Freq: Every day | ORAL | 1 refills | Status: AC

## 2024-08-29 NOTE — Progress Notes (Signed)
 BP 131/69   Pulse 68   Ht 5' 10 (1.778 m)   Wt 195 lb (88.5 kg)   SpO2 97%   BMI 27.98 kg/m    Subjective:   Patient ID: Johnny Foster, male    DOB: [birth year], 68 y.o.   MRN: [id number]  HPI: Johnny Foster is a 68 y.o. male presenting on 08/29/2024 for Medical Management of Chronic Issues, Hyperlipidemia, and Hypertension   Discussed the use of AI scribe software for clinical note transcription with the patient, who gave verbal consent to proceed.  History of Present Illness   Johnny Foster is a 68 year old male who presents for follow-up after a previous ER visit for stomach issues and to discuss recent diagnostic tests.  Gastrointestinal symptoms - Recent episode of stomach discomfort attributed to consumption of spicy food - Sensation of fullness in the stomach - Soft bowel movements during the episode - Symptoms improved with dietary adjustments, including increased intake of greens - Used over-the-counter medication as recommended during ER visit  Pulmonary findings and tobacco use - Recent lung cancer screening revealed a few small pulmonary nodules and evidence of emphysema - History of tobacco use; currently working on smoking cessation to improve lung health - History of pneumonia over 30 years ago  Hypertension and hyperlipidemia management - Currently taking lisinopril  for blood pressure control - Taking aspirin  and a cholesterol-lowering medication  Post-dental procedure care - Recently completed a course of amoxicillin following dental extractions  Foot callus - Callus present on the lateral aspect of the foot, attributed to improper shoe fit - Switched to wider shoes, resulting in symptom improvement  Visual disturbance - Difficulty with near vision, requiring vision correction - No recent evaluation by an eye care provider  Preventive health and immunizations - Considering vaccinations for influenza, tetanus, and pneumococcal  disease  Physical activity - Engages in daily walking and participates in bowling          Relevant past medical, surgical, family and social history reviewed and updated as indicated. Interim medical history since our last visit reviewed. Allergies and medications reviewed and updated.  Review of Systems  Constitutional:  Negative for chills and fever.  Eyes:  Negative for visual disturbance.  Respiratory:  Negative for shortness of breath and wheezing.   Cardiovascular:  Negative for chest pain and leg swelling.  Musculoskeletal:  Negative for back pain and gait problem.  Skin:  Negative for rash.  Neurological:  Negative for dizziness and light-headedness.  All other systems reviewed and are negative.   Per HPI unless specifically indicated above   Allergies as of 08/29/2024   No Known Allergies      Medication List        Accurate as of August 29, 2024  1:51 PM. If you have any questions, ask your nurse or doctor.          STOP taking these medications    predniSONE  20 MG tablet Commonly known as: DELTASONE  Stopped by: Johnny LABOR Elizabeth Paulsen       TAKE these medications    aspirin  EC 81 MG tablet Commonly known as: Aspirin  Low Dose Take 1 tablet (81 mg total) by mouth daily. Swallow whole. What changed: See the new instructions. Changed by: Johnny LABOR Kaley Jutras   atorvastatin  80 MG tablet Commonly known as: LIPITOR Take 1 tablet (80 mg total) by mouth at bedtime.   lisinopril  5 MG tablet Commonly known as: ZESTRIL  Take 1 tablet (5 mg total) by mouth  daily.   nitroGLYCERIN  0.4 MG SL tablet Commonly known as: NITROSTAT  ONE TABLET UNDER TONGUE AS NEEDED FOR CHEST PAIN         Objective:   BP 131/69   Pulse 68   Ht 5' 10 (1.778 m)   Wt 195 lb (88.5 kg)   SpO2 97%   BMI 27.98 kg/m   Wt Readings from Last 3 Encounters:  08/29/24 195 lb (88.5 kg)  06/11/24 196 lb 3.4 oz (89 kg)  12/28/23 197 lb (89.4 kg)    Physical Exam Physical Exam    VITALS: BP- 131/ NECK: Thyroid without nodules. CHEST: Lungs clear to auscultation bilaterally. CARDIOVASCULAR: Heart regular rate and rhythm. EXTREMITIES: Callus on the lateral aspect of the foot.         Assessment & Plan:   Problem List Items Addressed This Visit       Cardiovascular and Mediastinum   Essential hypertension   Relevant Medications   aspirin  EC (ASPIRIN  LOW DOSE) 81 MG tablet   atorvastatin  (LIPITOR) 80 MG tablet   lisinopril  (ZESTRIL ) 5 MG tablet   Other Relevant Orders   CBC with Differential/Platelet   CMP14+EGFR   Lipid panel   TSH   ASCVD (arteriosclerotic cardiovascular disease)   Relevant Medications   aspirin  EC (ASPIRIN  LOW DOSE) 81 MG tablet   atorvastatin  (LIPITOR) 80 MG tablet   lisinopril  (ZESTRIL ) 5 MG tablet   Other Relevant Orders   CBC with Differential/Platelet   CMP14+EGFR   Lipid panel   TSH     Other   Mixed hyperlipidemia   Relevant Medications   aspirin  EC (ASPIRIN  LOW DOSE) 81 MG tablet   atorvastatin  (LIPITOR) 80 MG tablet   lisinopril  (ZESTRIL ) 5 MG tablet   Other Relevant Orders   CBC with Differential/Platelet   CMP14+EGFR   Lipid panel   TSH       Emphysema Lung cancer screening showed small spots likely scar tissue and emphysema. - Encouraged smoking cessation. - Repeat lung cancer screening in March.  Essential hypertension Blood pressure controlled at 131/unknown without medication today. - Continue lisinopril . - Use reminders for daily medication adherence.  Mixed hyperlipidemia Continues cholesterol medication as prescribed.  Callus of foot Callus likely due to chronic rubbing, possibly related to shoe fit. - Visit [employer] in [place] for gait analysis and shoe fitting. - Consider wider shoes to reduce rubbing.          Follow up plan: Return in about 6 months (around 02/27/2025), or if symptoms worsen or fail to improve, for Hypertension and hyperlipidemia and physical.  Counseling  provided for all of the vaccine components Orders Placed This Encounter  Procedures   CBC with Differential/Platelet   CMP14+EGFR   Lipid panel   TSH    Johnny Levins, MD [facility] 08/29/2024, 1:51 PM

## 2024-09-12 ENCOUNTER — Ambulatory Visit (INDEPENDENT_AMBULATORY_CARE_PROVIDER_SITE_OTHER): Admitting: *Deleted

## 2024-09-12 DIAGNOSIS — Z23 Encounter for immunization: Secondary | ICD-10-CM

## 2024-09-12 NOTE — Progress Notes (Signed)
 Patient is in office today for a nurse visit for Influenza high DoseImmunization. Patient Injection was given in the  Left deltoid. Patient tolerated injection well.

## 2024-09-17 ENCOUNTER — Ambulatory Visit: Admitting: *Deleted

## 2024-09-17 DIAGNOSIS — Z23 Encounter for immunization: Secondary | ICD-10-CM | POA: Diagnosis not present

## 2024-09-17 NOTE — Progress Notes (Signed)
 Patient seen in triage for immunizations.  Prevnar 20 was given in the right deltoid and shingrix was given in the left deltoid. Patient tolerated well.

## 2024-10-09 NOTE — Progress Notes (Unsigned)
  Cardiology Office Note:   Date:  10/10/2024  ID:  Johnny Foster, DOB 1956/05/13, MRN [id number] PCP: Johnny Foster, Johnny LABOR, Johnny Foster  [facility] Providers Cardiologist:  Johnny Schilling, Johnny Foster {  History of Present Illness:   Johnny Foster is a 68 y.o. male who presents for follow up of CAD  He had a cardiac cath in June 2019 in [place] after presenting with a acute inferolateral MI.  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  EF was normal.      Since I last saw him he has had no new cardiac problems.  The patient denies any new symptoms such as chest discomfort, neck or arm discomfort. There has been no new shortness of breath, PND or orthopnea. There have been no reported palpitations, presyncope or syncope.    He is very active.  He is bowling now.    ROS: As stated in the HPI and negative for all other systems.  Studies Reviewed:    EKG:   EKG Interpretation Date/Time:  Thursday October 10 2024 11:02:41 EST Ventricular Rate:  73 PR Interval:  144 QRS Duration:  88 QT Interval:  398 QTC Calculation: 438 R Axis:   52  Text Interpretation: Normal sinus rhythm Possible Left atrial enlargement When compared with ECG of 21-Jun-2023 13:06, No significant change was found Confirmed by Foster Johnny ([id number]) on 10/10/2024 11:15:41 AM    Risk Assessment/Calculations:     Physical Exam:   VS:  BP (!) 140/80 (BP Location: Left Arm, Patient Position: Sitting, Cuff Size: Normal)   Pulse 73   Ht 5' 10 (1.778 m)   Wt 198 lb (89.8 kg)   BMI 28.41 kg/m    Wt Readings from Last 3 Encounters:  10/10/24 198 lb (89.8 kg)  08/29/24 195 lb (88.5 kg)  06/11/24 196 lb 3.4 oz (89 kg)     GEN: Well nourished, well developed in no acute distress NECK: No JVD; No carotid bruits CARDIAC: RRR, no murmurs, rubs, gallops RESPIRATORY:  Clear to auscultation without rales, wheezing or rhonchi  ABDOMEN: Soft, non-tender,  non-distended EXTREMITIES:  No edema; No deformity   ASSESSMENT AND PLAN:   CAD:  The patient has no new sypmtoms.  No further cardiovascular testing is indicated.  We will continue with aggressive risk reduction and meds as listed.  TOBACCO ABUSE : We have discussed that multiple occasions need to stop smoking completely.  He is down to 2 to 3 cigarettes a day.   DYSLIPIDEMIA:    LDL was 58 with HDL of 75 and he needs this to be repeated.  Will have it done by his primary provider.    HTN: His blood pressure is mildly elevated.  He is going to keep a blood pressure diary and we might need med adjustment.  AAA: I will do a screening ultrasound for a 68 year old smoking male.  ED I will order Viagra.  He knows not to use this with his nitroglycerin  but he has not been using the nitroglycerin  at all.:    Follow up with me in 6 months.  Signed, Johnny Schilling, Johnny Foster

## 2024-10-10 ENCOUNTER — Other Ambulatory Visit: Payer: Self-pay

## 2024-10-10 ENCOUNTER — Ambulatory Visit: Admitting: Cardiology

## 2024-10-10 ENCOUNTER — Encounter: Payer: Self-pay | Admitting: Cardiology

## 2024-10-10 ENCOUNTER — Ambulatory Visit: Attending: Cardiology | Admitting: Cardiology

## 2024-10-10 VITALS — BP 140/80 | HR 73 | Ht 70.0 in | Wt 198.0 lb

## 2024-10-10 DIAGNOSIS — I2510 Atherosclerotic heart disease of native coronary artery without angina pectoris: Secondary | ICD-10-CM | POA: Insufficient documentation

## 2024-10-10 DIAGNOSIS — I10 Essential (primary) hypertension: Secondary | ICD-10-CM | POA: Diagnosis present

## 2024-10-10 DIAGNOSIS — Z720 Tobacco use: Secondary | ICD-10-CM | POA: Insufficient documentation

## 2024-10-10 DIAGNOSIS — E785 Hyperlipidemia, unspecified: Secondary | ICD-10-CM | POA: Diagnosis not present

## 2024-10-10 MED ORDER — SILDENAFIL CITRATE 50 MG PO TABS: 50.0000 mg | ORAL_TABLET | Freq: Every day | ORAL | 3 refills | Status: AC | PRN

## 2024-10-10 NOTE — Patient Instructions (Addendum)
 Medication Instructions:  Your physician has recommended you make the following change in your medication:  1) START using Viagra  50 mg as needed *If you need a refill on your cardiac medications before your next appointment, please call your pharmacy*  Lab Work: Fasting Lipids - please have these done at your PCP   Testing/Procedures: Abdominal Ultrasound Your physician has requested that you have an abdominal aorta duplex. During this test, an ultrasound is used to evaluate the aorta. Allow 30 minutes for this exam. Do not eat after midnight the day before and avoid carbonated beverages.  Please note: We ask at that you not bring children with you during ultrasound (echo/ vascular) testing. Due to room size and safety concerns, children are not allowed in the ultrasound rooms during exams. Our front office staff cannot provide observation of children in our lobby area while testing is being conducted. An adult accompanying a patient to their appointment will only be allowed in the ultrasound room at the discretion of the ultrasound technician under special circumstances. We apologize for any inconvenience.   Follow-Up: At [facility], you and your health needs are our priority.  As part of our continuing mission to provide you with exceptional heart care, our providers are all part of one team.  This team includes your primary Cardiologist (physician) and Advanced Practice Providers or APPs (Physician Assistants and Nurse Practitioners) who all work together to provide you with the care you need, when you need it.  Your next appointment:   6 months  Provider:   Lynwood Schilling, MD     Other Instructions Please keep a log of your blood pressure for the next couple of weeks and send us  over a list of readings.

## 2024-10-15 ENCOUNTER — Other Ambulatory Visit

## 2024-10-16 LAB — LIPID PANEL
Chol/HDL Ratio: 2.1 ratio (ref 0.0–5.0)
Cholesterol, Total: 140 mg/dL (ref 100–199)
HDL: 68 mg/dL (ref >39)
LDL Chol Calc (NIH): 42 mg/dL (ref 0–99)
Triglycerides: 191 mg/dL — ABNORMAL HIGH (ref 0–149)
VLDL Cholesterol Cal: 30 mg/dL (ref 5–40)

## 2024-10-19 ENCOUNTER — Ambulatory Visit: Payer: Self-pay | Admitting: Cardiology

## 2024-10-30 ENCOUNTER — Ambulatory Visit

## 2024-11-01 NOTE — Telephone Encounter (Signed)
 Pt returning call regarding results. Please advise

## 2024-11-01 NOTE — Progress Notes (Signed)
 Attempted to call patient for his lab results, no answer left a vm to call back.

## 2024-11-08 ENCOUNTER — Ambulatory Visit: Payer: Self-pay

## 2024-11-08 VITALS — BP 140/80 | HR 73 | Ht 70.0 in | Wt 198.0 lb

## 2024-11-08 DIAGNOSIS — Z0000 Encounter for general adult medical examination without abnormal findings: Secondary | ICD-10-CM

## 2024-11-08 NOTE — Progress Notes (Signed)
 "  Chief Complaint  Patient presents with   Medicare Wellness     Subjective:   Johnny Foster is a 68 y.o. male who presents for a Medicare Annual Wellness Visit.  Visit info / Clinical Intake: Medicare Wellness Visit Type:: Subsequent Annual Wellness Visit Persons participating in visit and providing information:: patient Medicare Wellness Visit Mode:: Telephone If telephone:: video declined Since this visit was completed virtually, some vitals may be partially provided or unavailable. Missing vitals are due to the limitations of the virtual format.: Unable to obtain vitals - no equipment If Telephone or Video please confirm:: I connected with patient using audio/video enable telemedicine. I verified patient identity with two identifiers, discussed telehealth limitations, and patient agreed to proceed. Patient Location:: home Provider Location:: home office Interpreter Needed?: No Pre-visit prep was completed: yes AWV questionnaire completed by patient prior to visit?: no Living arrangements:: lives with spouse/significant other Patient's Overall Health Status Rating: very good Typical amount of pain: none Does pain affect daily life?: no Are you currently prescribed opioids?: no  Dietary Habits and Nutritional Risks How many meals a day?: 3 Eats fruit and vegetables daily?: yes Most meals are obtained by: preparing own meals In the last 2 weeks, have you had any of the following?: none Diabetic:: no  Functional Status Activities of Daily Living (to include ambulation/medication): Independent Ambulation: Independent Medication Administration: Independent Home Management (perform basic housework or laundry): Independent Manage your own finances?: yes Primary transportation is: driving Concerns about vision?: no *vision screening is required for WTM* (last ov 64yrs ago) Concerns about hearing?: no  Fall Screening Falls in the past year?: 0 Number of falls in past year:  0 Was there an injury with Fall?: 0 Fall Risk Category Calculator: 0 Patient Fall Risk Level: Low Fall Risk  Fall Risk Patient at Risk for Falls Due to: No Fall Risks Fall risk Follow up: Falls evaluation completed; Education provided  Home and Transportation Safety: All rugs have non-skid backing?: yes All stairs or steps have railings?: yes Grab bars in the bathtub or shower?: yes Have non-skid surface in bathtub or shower?: yes Good home lighting?: yes Regular seat belt use?: yes Hospital stays in the last year:: no  Cognitive Assessment Difficulty concentrating, remembering, or making decisions? : no Will 6CIT or Mini Cog be Completed: yes What year is it?: 0 points What month is it?: 0 points Give patient an address phrase to remember (5 components): [address] [place] About what time is it?: 0 points Count backwards from 20 to 1: 0 points Say the months of the year in reverse: 0 points Repeat the address phrase from earlier: 0 points 6 CIT Score: 0 points  Advance Directives (For Healthcare) Does Patient Have a Medical Advance Directive?: No Would patient like information on creating a medical advance directive?: No - Patient declined  Reviewed/Updated  Reviewed/Updated: Reviewed All (Medical, Surgical, Family, Medications, Allergies, Care Teams, Patient Goals); Medical History; Surgical History; Family History; Medications; Allergies; Care Teams; Patient Goals    Allergies (verified) Patient has no known allergies.   Current Medications (verified) Outpatient Encounter Medications as of 11/08/2024  Medication Sig   aspirin  EC (ASPIRIN  LOW DOSE) 81 MG tablet Take 1 tablet (81 mg total) by mouth daily. Swallow whole.   atorvastatin  (LIPITOR) 80 MG tablet Take 1 tablet (80 mg total) by mouth at bedtime.   lisinopril  (ZESTRIL ) 5 MG tablet Take 1 tablet (5 mg total) by mouth daily.   nitroGLYCERIN  (NITROSTAT ) 0.4 MG  SL tablet ONE TABLET UNDER TONGUE AS  NEEDED FOR CHEST PAIN   sildenafil  (VIAGRA ) 50 MG tablet Take 1 tablet (50 mg total) by mouth daily as needed for erectile dysfunction.   No facility-administered encounter medications on file as of 11/08/2024.    History: Past Medical History:  Diagnosis Date   Hepatitis C    Treated   Hyperlipidemia    Hypertension    Iron deficiency anemia 10/02/2018   Myocardial infarction [facility])    May 2019.  LAD moderate 60 to 80% stenosis.  Circumflex OM occluded.  RCA 80% stenosis.  OM was thought to be a acute infarct vessel.  It was treated with a Synergy stent.  This is a 2.5 x 20 mm, RCA was treated with a 3.  3.0 by 16 mm Synergy.  Elective PCI in June of an LAD lesion with 3.0 x 38 mm Synergy.   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   Past Surgical History:  Procedure Laterality Date   CARDIAC VALVE SURGERY     TONSILECTOMY, ADENOIDECTOMY, BILATERAL MYRINGOTOMY AND TUBES  1969   Family History  Problem Relation Age of Onset   Heart failure Mother    Arthritis Mother    CAD Mother 30   Lung disease Father    Cancer Sister    Parkinson's disease Half-Brother    Social History   Occupational History   Occupation: retired  Tobacco Use   Smoking status: Every Day    Current packs/day: 0.25    Average packs/day: 0.3 packs/day for 40.0 years (10.0 ttl pk-yrs)    Types: Cigarettes   Smokeless tobacco: Never  Vaping Use   Vaping status: Never Used  Substance and Sexual Activity   Alcohol use: Yes    Comment: rarely   Drug use: Not Currently    Types: Other-see comments    Comment: history of Heroin and Cocaine use in his teens    Sexual activity: Yes   Tobacco Counseling Ready to quit: Not Answered Counseling given: Yes  SDOH Screenings   Food Insecurity: No Food Insecurity (11/08/2024)  Housing: Unknown (11/08/2024)  Transportation Needs: No Transportation Needs (11/08/2024)  Utilities: Not At Risk (11/08/2024)  Alcohol Screen: Low Risk  (10/28/2022)  Depression (PHQ2-9): Low Risk (11/08/2024)  Financial Resource Strain: Low Risk (10/28/2022)  Physical Activity: Sufficiently Active (11/08/2024)  Social Connections: Socially Integrated (11/08/2024)  Stress: No Stress Concern Present (11/08/2024)  Tobacco Use: High Risk (11/08/2024)  Health Literacy: Adequate Health Literacy (11/08/2024)   See flowsheets for full screening details  Depression Screen PHQ 2 & 9 Depression Scale- Over the past 2 weeks, how often have you been bothered by any of the following problems? Little interest or pleasure in doing things: 0 Feeling down, depressed, or hopeless (PHQ Adolescent also includes...irritable): 0 PHQ-2 Total Score: 0 Trouble falling or staying asleep, or sleeping too much: 0 Feeling tired or having little energy: 0 Poor appetite or overeating (PHQ Adolescent also includes...weight loss): 0 Feeling bad about yourself - or that you are a failure or have let yourself or your family down: 0 Trouble concentrating on things, such as reading the newspaper or watching television (PHQ Adolescent also includes...like school work): 0 Moving or speaking so slowly that other people could have noticed. Or the opposite - being so fidgety or restless that you have been moving around a lot more than usual: 0 Thoughts that you would be better off dead, or of hurting  yourself in some way: 0 PHQ-9 Total Score: 0 If you checked off any problems, how difficult have these problems made it for you to do your work, take care of things at home, or get along with other people?: Not difficult at all     Goals Addressed             This Visit's Progress    Quit Smoking   On track            Objective:    There were no vitals filed for this visit. There is no height or weight on file to calculate BMI.  Hearing/Vision screen No results found. Immunizations and Health Maintenance Health Maintenance  Topic Date Due   DTaP/Tdap/Td (1 -  Tdap) Never done   COVID-19 Vaccine (4 - 2025-26 season) 07/22/2024   Colonoscopy  03/05/2025   Zoster Vaccines- Shingrix  (2 of 2) 11/12/2024   Medicare Annual Wellness (AWV)  11/08/2025   Pneumococcal Vaccine: 50+ Years  Completed   Influenza Vaccine  Completed   Hepatitis C Screening  Completed   Meningococcal B Vaccine  Aged Out        Assessment/Plan:  This is a routine wellness examination for Johnny Foster.  Patient Care Team: Dettinger, Fonda LABOR, MD as PCP - General (Family Medicine) Lynwood Schilling, MD as PCP - Cardiology (Cardiology)  I have personally reviewed and noted the following in the patients chart:   Medical and social history Use of alcohol, tobacco or illicit drugs  Current medications and supplements including opioid prescriptions. Functional ability and status Nutritional status Physical activity Advanced directives List of other physicians Hospitalizations, surgeries, and ER visits in previous 12 months Vitals Screenings to include cognitive, depression, and falls Referrals and appointments  No orders of the defined types were placed in this encounter.  In addition, I have reviewed and discussed with patient certain preventive protocols, quality metrics, and best practice recommendations. A written personalized care plan for preventive services as well as general preventive health recommendations were provided to patient.   Ozie Ned, CMA   11/08/2024   Return in 1 year (on 11/08/2025).  After Visit Summary: (MyChart) Due to this being a telephonic visit, the after visit summary with patients personalized plan was offered to patient via MyChart   Nurse Notes: Per pt will get the following at next OV w/pcp: DTAP, 2nd Shingles vaccine, Covid, and Colonoscopy.  "

## 2024-11-08 NOTE — Patient Instructions (Signed)
 Mr. Lefevers,  Thank you for taking the time for your Medicare Wellness Visit. I appreciate your continued commitment to your health goals. Please review the care plan we discussed, and feel free to reach out if I can assist you further.  Please note that Annual Wellness Visits do not include a physical exam. Some assessments may be limited, especially if the visit was conducted virtually. If needed, we may recommend an in-person follow-up with your provider.  Ongoing Care Seeing your primary care provider every 3 to 6 months helps us  monitor your health and provide consistent, personalized care.   Referrals If a referral was made during today's visit and you haven't received any updates within two weeks, please contact the referred provider directly to check on the status.  Recommended Screenings:  Health Maintenance  Topic Date Due   DTaP/Tdap/Td vaccine (1 - Tdap) Never done   Medicare Annual Wellness Visit  10/29/2023   COVID-19 Vaccine (4 - 2025-26 season) 07/22/2024   Colon Cancer Screening  03/05/2025   Zoster (Shingles) Vaccine (2 of 2) 11/12/2024   Pneumococcal Vaccine for age over 81  Completed   Flu Shot  Completed   Hepatitis C Screening  Completed   Meningitis B Vaccine  Aged Out       11/08/2024    1:53 PM  Advanced Directives  Does Patient Have a Medical Advance Directive? No  Would patient like information on creating a medical advance directive? No - Patient declined    Vision: Annual vision screenings are recommended for early detection of glaucoma, cataracts, and diabetic retinopathy. These exams can also reveal signs of chronic conditions such as diabetes and high blood pressure.  Dental: Annual dental screenings help detect early signs of oral cancer, gum disease, and other conditions linked to overall health, including heart disease and diabetes.  Please see the attached documents for additional preventive care recommendations.

## 2024-11-12 ENCOUNTER — Ambulatory Visit (HOSPITAL_COMMUNITY)
Admission: RE | Admit: 2024-11-12 | Discharge: 2024-11-12 | Disposition: A | Discharge status: Home / Self Care | Source: Ambulatory Visit | Attending: Cardiology | Admitting: Cardiology

## 2024-11-12 DIAGNOSIS — Z720 Tobacco use: Secondary | ICD-10-CM | POA: Insufficient documentation

## 2024-11-12 DIAGNOSIS — I2510 Atherosclerotic heart disease of native coronary artery without angina pectoris: Secondary | ICD-10-CM | POA: Insufficient documentation

## 2024-11-12 DIAGNOSIS — E785 Hyperlipidemia, unspecified: Secondary | ICD-10-CM | POA: Diagnosis not present

## 2024-11-12 DIAGNOSIS — I10 Essential (primary) hypertension: Secondary | ICD-10-CM | POA: Insufficient documentation

## 2025-02-27 ENCOUNTER — Encounter: Payer: Self-pay | Admitting: Family Medicine
